# Patient Record
Sex: Male | Born: 1935
Health system: Southern US, Community
[De-identification: ages and names within clinical notes are randomized; demographics above are authoritative.]

## PROBLEM LIST (undated history)

## (undated) DIAGNOSIS — IMO0002 Reserved for concepts with insufficient information to code with codable children: Secondary | ICD-10-CM

## (undated) DIAGNOSIS — N400 Enlarged prostate without lower urinary tract symptoms: Secondary | ICD-10-CM

## (undated) DIAGNOSIS — J189 Pneumonia, unspecified organism: Secondary | ICD-10-CM

## (undated) DIAGNOSIS — E782 Mixed hyperlipidemia: Secondary | ICD-10-CM

## (undated) DIAGNOSIS — E119 Type 2 diabetes mellitus without complications: Secondary | ICD-10-CM

## (undated) DIAGNOSIS — I493 Ventricular premature depolarization: Secondary | ICD-10-CM

## (undated) DIAGNOSIS — Z860100 Personal history of colon polyps, unspecified: Secondary | ICD-10-CM

## (undated) DIAGNOSIS — I701 Atherosclerosis of renal artery: Secondary | ICD-10-CM

## (undated) DIAGNOSIS — J449 Chronic obstructive pulmonary disease, unspecified: Secondary | ICD-10-CM

## (undated) DIAGNOSIS — Z8619 Personal history of other infectious and parasitic diseases: Secondary | ICD-10-CM

## (undated) DIAGNOSIS — M1990 Unspecified osteoarthritis, unspecified site: Secondary | ICD-10-CM

## (undated) DIAGNOSIS — C3490 Malignant neoplasm of unspecified part of unspecified bronchus or lung: Secondary | ICD-10-CM

## (undated) DIAGNOSIS — Z8709 Personal history of other diseases of the respiratory system: Secondary | ICD-10-CM

## (undated) DIAGNOSIS — C4490 Unspecified malignant neoplasm of skin, unspecified: Secondary | ICD-10-CM

## (undated) DIAGNOSIS — I10 Essential (primary) hypertension: Secondary | ICD-10-CM

## (undated) DIAGNOSIS — N289 Disorder of kidney and ureter, unspecified: Secondary | ICD-10-CM

## (undated) DIAGNOSIS — I491 Atrial premature depolarization: Secondary | ICD-10-CM

## (undated) DIAGNOSIS — I6529 Occlusion and stenosis of unspecified carotid artery: Secondary | ICD-10-CM

## (undated) DIAGNOSIS — IMO0001 Reserved for inherently not codable concepts without codable children: Secondary | ICD-10-CM

## (undated) HISTORY — PX: COLONOSCOPY: SHX174

## (undated) HISTORY — DX: Unspecified malignant neoplasm of skin, unspecified: C44.90

## (undated) HISTORY — DX: Unspecified osteoarthritis, unspecified site: M19.90

## (undated) HISTORY — DX: Mixed hyperlipidemia: E78.2

## (undated) HISTORY — DX: Disorder of kidney and ureter, unspecified: N28.9

## (undated) HISTORY — DX: Ventricular premature depolarization: I49.3

## (undated) HISTORY — DX: Atherosclerosis of renal artery: I70.1

## (undated) HISTORY — DX: Atrial premature depolarization: I49.1

## (undated) HISTORY — DX: Occlusion and stenosis of unspecified carotid artery: I65.29

## (undated) HISTORY — DX: Reserved for inherently not codable concepts without codable children: IMO0001

## (undated) HISTORY — DX: Reserved for concepts with insufficient information to code with codable children: IMO0002

## (undated) HISTORY — PX: NO PAST SURGERIES: SHX2092

## (undated) HISTORY — DX: Type 2 diabetes mellitus without complications: E11.9

## (undated) HISTORY — DX: Essential (primary) hypertension: I10

---

## 2000-07-10 ENCOUNTER — Emergency Department (HOSPITAL_COMMUNITY): Admission: EM | Admit: 2000-07-10 | Discharge: 2000-07-10 | Payer: Self-pay | Admitting: Emergency Medicine

## 2000-10-24 ENCOUNTER — Ambulatory Visit (HOSPITAL_COMMUNITY): Admission: RE | Admit: 2000-10-24 | Discharge: 2000-10-24 | Payer: Self-pay | Admitting: Gastroenterology

## 2000-10-24 ENCOUNTER — Encounter (INDEPENDENT_AMBULATORY_CARE_PROVIDER_SITE_OTHER): Payer: Self-pay | Admitting: *Deleted

## 2005-04-11 ENCOUNTER — Encounter: Admission: RE | Admit: 2005-04-11 | Discharge: 2005-04-11 | Payer: Self-pay | Admitting: Cardiology

## 2006-03-11 ENCOUNTER — Ambulatory Visit: Payer: Self-pay | Admitting: Vascular Surgery

## 2006-04-15 ENCOUNTER — Ambulatory Visit: Payer: Self-pay | Admitting: Vascular Surgery

## 2006-09-23 ENCOUNTER — Encounter: Admission: RE | Admit: 2006-09-23 | Discharge: 2006-09-23 | Payer: Self-pay | Admitting: Emergency Medicine

## 2006-10-18 ENCOUNTER — Ambulatory Visit: Payer: Self-pay | Admitting: Vascular Surgery

## 2007-05-23 ENCOUNTER — Ambulatory Visit: Payer: Self-pay | Admitting: Vascular Surgery

## 2008-05-28 ENCOUNTER — Ambulatory Visit: Payer: Self-pay | Admitting: Vascular Surgery

## 2009-05-25 ENCOUNTER — Ambulatory Visit: Payer: Self-pay | Admitting: Vascular Surgery

## 2010-01-17 ENCOUNTER — Ambulatory Visit: Admit: 2010-01-17 | Payer: Self-pay | Admitting: Vascular Surgery

## 2010-01-17 ENCOUNTER — Ambulatory Visit
Admission: RE | Admit: 2010-01-17 | Discharge: 2010-01-17 | Payer: Self-pay | Source: Home / Self Care | Attending: Vascular Surgery | Admitting: Vascular Surgery

## 2010-05-30 NOTE — Procedures (Signed)
CAROTID DUPLEX EXAM   INDICATION:  Follow up carotid artery disease.   HISTORY:  Diabetes:  No.  Cardiac:  No.  Hypertension:  Yes.  Smoking:  No.  Previous Surgery:  No.  CV History:  Asymptomatic.  Amaurosis Fugax No, Paresthesias No, Hemiparesis No.                                       RIGHT             LEFT  Brachial systolic pressure:         140               138  Brachial Doppler waveforms:         WNL               WNL  Vertebral direction of flow:        Antegrade         Antegrade  DUPLEX VELOCITIES (cm/sec)  CCA peak systolic                   M = 113, D = 134  93  ECA peak systolic                   264               431  ICA peak systolic                   329               257  ICA end diastolic                   36                45  PLAQUE MORPHOLOGY:                  Calcific          Calcific  PLAQUE AMOUNT:                      Moderate          Moderate  PLAQUE LOCATION:                    ICA, ECA          CCA, ICA, ECA   IMPRESSION:  1. Bilateral internal carotid artery suggests 60% to 79% stenosis.  2. Antegrade flow in bilateral vertebrals.  3. Stenosis noted in bilateral external carotid arteries.   ___________________________________________  Larina Earthly, M.D.   CB/MEDQ  D:  05/25/2009  T:  05/25/2009  Job:  [id number]

## 2010-05-30 NOTE — Assessment & Plan Note (Signed)
OFFICE VISIT   Souter, Manual C  DOB:  September 24, 1927                                       10/18/2006  [id number]#:[id number]   The patient presents today for evaluation of his asymptomatic carotid  disease.  He has had no neurologic deficits, specifically amaurosis  fugax, transient ischemic attack or stroke since my initial visit with  him in February of 2008.  He has known moderate to severe right and  moderate left carotid stenosis.  He reports that had an MRA suggesting  moderate renal artery stenosis and is undergoing further consideration  of this as well.  We had a discussion regarding treatment of  asymptomatic renal artery stenosis and I explained that we are  relatively conservative with this unless there is diminished renal  function or uncontrolled hypertension.  His physical exam is unchanged.  His carotid arteries are without bruits bilaterally.  He has 2+ radial  pulses.  He is grossly intact neurologically.  Blood pressure today is  144/75, pulse 59, respirations 18.  His carotid duplex shows no change  with 60-79% right carotid stenosis and 40-59% left carotid stenosis on  today's exam.  I again reviewed symptoms of carotid disease with him; he  will notify us should this occur.  Otherwise we will see him in 1 year  with repeat carotid duplex evaluation.   Larina Earthly, M.D.  Electronically Signed   TFE/MEDQ  D:  10/18/2006  T:  10/21/2006  Job:  [id number]   cc:   Brett Canales A. Cleta Alberts, M.D.

## 2010-05-30 NOTE — Procedures (Signed)
CAROTID DUPLEX EXAM   INDICATION:  Followup carotid artery disease.   HISTORY:  Diabetes:  No.  Cardiac:  No.  Hypertension:  Yes.  Smoking:  No.  Previous Surgery:  No.  CV History:  The patient is currently asymptomatic.  Amaurosis Fugax No, Paresthesias No, Hemiparesis No                                       RIGHT             LEFT  Brachial systolic pressure:         142               136  Brachial Doppler waveforms:         WNL               WNL  Vertebral direction of flow:        Antegrade         Antegrade  DUPLEX VELOCITIES (cm/sec)  CCA peak systolic                   85                85  ECA peak systolic                   185               237  ICA peak systolic                   121               197  ICA end diastolic                   20                22  PLAQUE MORPHOLOGY:                  Heterogeneous     Heterogeneous  PLAQUE AMOUNT:                      Moderate          Moderate  PLAQUE LOCATION:                    CCA, ICA and ECA  CCA, ICA and ECA   IMPRESSION:  Bilateral 40% to 59% stenosis within internal carotid  arteries.  Bilateral intimal thickening within the common carotid  arteries.  Severely dampened waveform within the right vertebral.  Prominent left vertebral.  Bilateral external carotid artery stenosis.   ___________________________________________  Larina Earthly, M.D.   OD/MEDQ  D:  01/17/2010  T:  01/17/2010  Job:  [id number]

## 2010-05-30 NOTE — Assessment & Plan Note (Signed)
OFFICE VISIT   Benjamin Patel, Benjamin Patel  DOB:  09/14/1927                                       01/17/2010  ONGEX#:[id number]   The patient presents today for evaluation and continued followup of  asymptomatic extracranial cerebrovascular occlusive disease.  He is a  very pleasant healthy 77 year old gentleman who is long-term followup of  asymptomatic carotid stenosis.  He has never had any amaurosis fugax,  transient ischemic attack or stroke.  He has no history of cardiac  disease.  Does have a history of controlled hypertension.  He is not a  diabetic.  He does not smoke cigarettes.  He does have a known history  of moderate renal artery stenosis and is to follow up with this at his  cardiologist's office later this month.  I again explained that we would  not recommend intervention unless he had uncontrolled hypertension or  significant suggestion of renal compromise.   PHYSICAL EXAM:  General:  Well-developed, well-nourished white male  appearing stated age, in no acute distress.  Vital signs:  Blood  pressure is 138/72, pulse 64, respirations 18.  HEENT:  Normal.  Chest:  Clear bilaterally without rales, rhonchi or wheezes.  Cardiovascular:  Heart regular rate and rhythm.  I do not appreciate carotid bruits.  He  has 2+ radial pulses and 2+ dorsalis pedis pulses bilaterally.  Musculoskeletal:  Shows no major deformity or cyanosis.  Neurological:  No focal weakness or paresthesias.   He underwent carotid duplex in our office which I reviewed and  independently interpreted with the patient.  This shows moderate  bilateral 40% to 59% stenoses.  His velocities are somewhat lower than  they had been at our last study.  I explained that we would recommend  continued yearly observation and I again reviewed symptoms of carotid  disease with him and he will notify me should this occur.  Otherwise we  will see him again in 1 year for repeat carotid duplex.     Larina Earthly, M.D.  Electronically Signed   TFE/MEDQ  D:  01/17/2010  T:  01/17/2010  Job:  [id number]   cc:   Brett Canales A. Cleta Alberts, M.D.

## 2010-05-30 NOTE — Procedures (Signed)
CAROTID DUPLEX EXAM   INDICATION:  Follow up known carotid artery disease.   HISTORY:  Diabetes:  No.  Cardiac:  No.  Hypertension:  Yes.  Smoking:  No.  Previous Surgery:  No.  CV History:  Amaurosis Fugax No, Paresthesias No, Hemiparesis No                                       RIGHT             LEFT  Brachial systolic pressure:         130               133  Brachial Doppler waveforms:         Biphasic          Biphasic  Vertebral direction of flow:        Antegrade         Antegrade  DUPLEX VELOCITIES (cm/sec)  CCA peak systolic                   63                91  ECA peak systolic                   268               195  ICA peak systolic                   301               147  ICA end diastolic                   46                26  PLAQUE MORPHOLOGY:                  Heterogenous      Heterogenous  PLAQUE AMOUNT:                      Moderate          Mild  PLAQUE LOCATION:                    Distal CCA, ICA, and ECA            Distal CCA, ICA, and ECA   IMPRESSION:  1. A 60-79% stenosis noted in the left internal carotid artery.  2. A 40-59% stenosis noted in the right internal carotid artery.  3. Antegrade bilateral vertebral arteries.   ___________________________________________  Larina Earthly, M.D.   MG/MEDQ  D:  10/18/2006  T:  10/19/2006  Job:  [id number]

## 2010-05-30 NOTE — Procedures (Signed)
CAROTID DUPLEX EXAM   INDICATION:  Carotid artery disease.   HISTORY:  Diabetes:  No.  Cardiac:  No.  Hypertension:  Yes.  Smoking:  No.  Previous Surgery:  No.  CV History:  Asymptomatic.  Amaurosis Fugax No, Paresthesias No, Hemiparesis No.                                       RIGHT              LEFT  Brachial systolic pressure:         162                164  Brachial Doppler waveforms:         Normal             Normal  Vertebral direction of flow:        Antegrade/dampened Antegrade  DUPLEX VELOCITIES (cm/sec)  CCA peak systolic                   159 (D)            94  ECA peak systolic                   216                352  ICA peak systolic                   229                188  ICA end diastolic                   32                 32  PLAQUE MORPHOLOGY:                  Calcific           Calcific  PLAQUE AMOUNT:                      Moderate/severe    Moderate  PLAQUE LOCATION:                    ICA/ECA/CCA        ICA/ECA/CCA   IMPRESSION:  1. Low-end 60% to 79% stenosis of the right internal carotid artery.  2. A 40% to 59% stenosis of the left internal carotid artery.  3. Severely dampened antegrade flow is noted within the right      vertebral artery.  4. No significant change noted when compared to the previous exam on      05/23/2007.   ___________________________________________  Larina Earthly, M.D.   CH/MEDQ  D:  05/28/2008  T:  05/28/2008  Job:  [id number]

## 2010-05-30 NOTE — Procedures (Signed)
CAROTID DUPLEX EXAM   INDICATION:  Followup evaluation of known carotid artery disease.   HISTORY:  Diabetes:  No.  Cardiac:  No.  Hypertension:  Yes.  Smoking:  No.  Previous Surgery:  No.  CV History:  The patient reports no cerebrovascular symptoms at this  time.  Amaurosis Fugax No, Paresthesias No, Hemiparesis No                                       RIGHT             LEFT  Brachial systolic pressure:         168               166  Brachial Doppler waveforms:         Triphasic         Triphasic  Vertebral direction of flow:        Inaudible         Antegrade  DUPLEX VELOCITIES (cm/sec)  CCA peak systolic                   92                82  ECA peak systolic                   241               169  ICA peak systolic                   214               159  ICA end diastolic                   22                23  PLAQUE MORPHOLOGY:                  Calcified         Calcified  PLAQUE AMOUNT:                      Moderate          Mild  PLAQUE LOCATION:                    Distal CCA, proximal ICA            Proximal ICA   IMPRESSION:  1. 60-79% right ICA stenosis (low end of range).  2. 40-59% left ICA stenosis.  3. Cannot rule out right vertebral artery occlusion versus trickle      flow.  4. No significant change from previous study performed 10/18/2006.   ___________________________________________  Larina Earthly, M.D.   MC/MEDQ  D:  05/23/2007  T:  05/23/2007  Job:  [id number]

## 2010-06-02 NOTE — Procedures (Signed)
[facility]  Patient:    Benjamin Patel, Benjamin Patel Visit Number: [id number] MRN: [id number]          Service Type: END Location: ENDO Attending Physician:  Nelda Marseille Proc. Date: 10/24/00 Admit Date:  10/24/2000   CC:         Viviann Spare A. Cleta Alberts, M.D.   Procedure Report  PROCEDURE:  Colonoscopy with polypectomy.  INDICATIONS:  Screening.  INFORMED CONSENT:  Consent was signed after risk, benefits, methods and options thoroughly discussed in the office.  MEDICINES USED:  Demerol 60 mg, Versed 6 mg.  DESCRIPTION OF PROCEDURE:  Rectal inspection was pertinent for small external hemorrhoids.  Digital exam was negative.  The pediatric video colonoscope was inserted and fairly easily advanced around the colon to the cecum.  On insertion in the proximal sigmoid, small and medium sized polyps were seen. Based on their size, we elected to remove them on withdrawal.  To advance to the cecum required rolling him on his back and some abdominal pressure.  The cecum was identified by the appendiceal orifice and the ileocecal valve.  The scope was slowly withdrawn.  There was left greater than right diverticula throughout.  The cecum and the ascending was normal.  In the hepatic flexure a small 3 mm polyp was seen and was hot biopsied x 2 and put in the first container.  No transverse or descending abnormalities were seen other than diverticula.  As we withdrew back to the polyps seen on insertion, the larger of the two was pedunculated and snared, electrocautery applied.  The polyp was removed, suctioned onto the head of the scope, and the scope was removed, the polyp recovered.  The scope was reinserted to the level of the polypectomy site, had a nice white coagulum without any residual polyp.  We then went ahead and snared the smaller polyp which was removed, suctioned through the scope, and collected in the trap.  There seemed to be some residual tissue. This  was a semi-sessile small polyp, and we went ahead and hot biopsied it x 2. There was a little bit of oozing from the hot biopsy site.  We increased the cautery to 25-25 and went ahead and used the hot biopsy forceps to cauterize which was done without obvious problem and nice cessation of bleed. The smaller polyp was put in the third container.  The scope was slowly withdrawn. In the distal sigmoid, the 2 mm polyp was seen and was hot biopsied x 1 and put in a fourth container.  No other abnormalities but the left-sided diverticula were seen.  Once back in the rectum, the scope was retroflexed, pertinent for some internal hemorrhoids.  The scope was straightened and readvanced to the polypectomy sites, without signs of bleeding or residual polyp.  Air was suctioned and the scope removed.  The patient tolerated the procedure well, and there was no obvious immediate complication.  ENDOSCOPIC DIAGNOSES: 1. Internal and external hemorrhoids. 2. Left greater than right diverticula. 3. Multiple small polyps in the distal sigmoid, hot biopsied; proximal    sigmoid, snared and hot biopsied; and hepatic flexure, hot biopsied x 2. 4. Medium sized polyp in the proximal sigmoid, status post snared. 5. Otherwise within normal limits to the cecum.  PLAN:  Await pathology to determine future colonic screening.  Otherwise, two week customary postpolypectomy instructions.  Return care to Dr. Cleta Alberts for customary health care maintenance and screening, including yearly rectals and guaiacs but happy to see back sooner  p.r.n. Attending Physician:  Nelda Marseille DD:  10/24/00 TD:  10/25/00 Job: [id number]

## 2010-10-06 ENCOUNTER — Other Ambulatory Visit: Payer: Self-pay | Admitting: Emergency Medicine

## 2010-10-10 ENCOUNTER — Ambulatory Visit
Admission: RE | Admit: 2010-10-10 | Discharge: 2010-10-10 | Disposition: A | Discharge status: Home / Self Care | Payer: Medicare Other | Source: Ambulatory Visit | Attending: Emergency Medicine | Admitting: Emergency Medicine

## 2010-11-14 ENCOUNTER — Encounter: Payer: Self-pay | Admitting: Emergency Medicine

## 2010-11-14 DIAGNOSIS — E1151 Type 2 diabetes mellitus with diabetic peripheral angiopathy without gangrene: Secondary | ICD-10-CM | POA: Insufficient documentation

## 2010-11-14 DIAGNOSIS — N289 Disorder of kidney and ureter, unspecified: Secondary | ICD-10-CM | POA: Insufficient documentation

## 2010-11-14 DIAGNOSIS — K8020 Calculus of gallbladder without cholecystitis without obstruction: Secondary | ICD-10-CM | POA: Insufficient documentation

## 2010-11-14 DIAGNOSIS — I701 Atherosclerosis of renal artery: Secondary | ICD-10-CM | POA: Insufficient documentation

## 2010-11-14 DIAGNOSIS — I10 Essential (primary) hypertension: Secondary | ICD-10-CM | POA: Insufficient documentation

## 2010-11-14 DIAGNOSIS — M109 Gout, unspecified: Secondary | ICD-10-CM | POA: Insufficient documentation

## 2010-11-14 DIAGNOSIS — I6529 Occlusion and stenosis of unspecified carotid artery: Secondary | ICD-10-CM | POA: Insufficient documentation

## 2010-11-14 DIAGNOSIS — E782 Mixed hyperlipidemia: Secondary | ICD-10-CM | POA: Insufficient documentation

## 2011-01-02 ENCOUNTER — Ambulatory Visit (INDEPENDENT_AMBULATORY_CARE_PROVIDER_SITE_OTHER): Payer: Medicare Other | Admitting: Emergency Medicine

## 2011-01-02 DIAGNOSIS — I10 Essential (primary) hypertension: Secondary | ICD-10-CM

## 2011-01-02 DIAGNOSIS — R972 Elevated prostate specific antigen [PSA]: Secondary | ICD-10-CM

## 2011-01-02 DIAGNOSIS — I2510 Atherosclerotic heart disease of native coronary artery without angina pectoris: Secondary | ICD-10-CM

## 2011-01-30 ENCOUNTER — Ambulatory Visit (INDEPENDENT_AMBULATORY_CARE_PROVIDER_SITE_OTHER): Payer: Medicare Other | Admitting: Emergency Medicine

## 2011-01-30 DIAGNOSIS — E119 Type 2 diabetes mellitus without complications: Secondary | ICD-10-CM | POA: Diagnosis not present

## 2011-01-30 DIAGNOSIS — I10 Essential (primary) hypertension: Secondary | ICD-10-CM | POA: Diagnosis not present

## 2011-01-30 DIAGNOSIS — I701 Atherosclerosis of renal artery: Secondary | ICD-10-CM

## 2011-02-13 ENCOUNTER — Ambulatory Visit (INDEPENDENT_AMBULATORY_CARE_PROVIDER_SITE_OTHER): Payer: Medicare Other | Admitting: Emergency Medicine

## 2011-02-13 ENCOUNTER — Other Ambulatory Visit: Payer: Self-pay | Admitting: Emergency Medicine

## 2011-02-13 VITALS — BP 154/70 | HR 67 | Temp 97.4°F | Resp 20 | Ht 67.0 in | Wt 170.4 lb

## 2011-02-13 DIAGNOSIS — E119 Type 2 diabetes mellitus without complications: Secondary | ICD-10-CM | POA: Diagnosis not present

## 2011-02-13 DIAGNOSIS — I10 Essential (primary) hypertension: Secondary | ICD-10-CM | POA: Diagnosis not present

## 2011-02-13 DIAGNOSIS — I2510 Atherosclerotic heart disease of native coronary artery without angina pectoris: Secondary | ICD-10-CM

## 2011-02-13 MED ORDER — HYDROCHLOROTHIAZIDE 25 MG PO TABS: 25.0000 mg | ORAL_TABLET | Freq: Every day | ORAL | Status: DC

## 2011-02-13 MED ORDER — METOPROLOL SUCCINATE ER 100 MG PO TB24: 100.0000 mg | ORAL_TABLET | Freq: Every day | ORAL | Status: DC

## 2011-02-13 MED ORDER — LOSARTAN POTASSIUM 50 MG PO TABS: 50.0000 mg | ORAL_TABLET | Freq: Every day | ORAL | Status: DC

## 2011-02-13 NOTE — Progress Notes (Signed)
  Subjective:    Patient ID: Benjamin Patel, male    DOB: 1927/04/02, 78 y.o.   MRN: [id number]  Hypertension This is a chronic problem. The current episode started more than 1 year ago. The problem is unchanged. Pertinent negatives include no chest pain, headaches, orthopnea, palpitations or peripheral edema. Risk factors for coronary artery disease include diabetes mellitus, dyslipidemia and family history. Past treatments include diuretics, beta blockers and angiotensin blockers. The current treatment provides moderate improvement. There are no compliance problems.       Review of Systems  Cardiovascular: Negative for chest pain, palpitations and orthopnea.  Neurological: Negative for headaches.      Objective:   Physical Exambp 150/70       Assessment & Plan:  Increase metoprolol to 100mg  per day

## 2011-02-13 NOTE — Patient Instructions (Signed)
Continue to monitor blood pressure. Phone report in 2 weeks.

## 2011-02-27 ENCOUNTER — Telehealth: Payer: Self-pay | Admitting: Emergency Medicine

## 2011-02-27 NOTE — Telephone Encounter (Signed)
I received a call from [facility] regarding his blood pressure readings. His systolic pressure remains high at 150 diastolics have been good at 70. He has an appointment to see Dr. Arbie Cookey for a recheck March 1. He is to notify me after that appointment and we'll make a decision about possibly increasing his clonidine to 0.3 twice a day if his pressure remains elevated.

## 2011-03-21 ENCOUNTER — Encounter: Payer: Self-pay | Admitting: Vascular Surgery

## 2011-03-22 ENCOUNTER — Other Ambulatory Visit (INDEPENDENT_AMBULATORY_CARE_PROVIDER_SITE_OTHER): Payer: Medicare Other | Admitting: *Deleted

## 2011-03-22 DIAGNOSIS — I6529 Occlusion and stenosis of unspecified carotid artery: Secondary | ICD-10-CM

## 2011-03-27 ENCOUNTER — Ambulatory Visit: Payer: Medicare Other | Admitting: Emergency Medicine

## 2011-03-30 ENCOUNTER — Other Ambulatory Visit: Payer: Self-pay | Admitting: *Deleted

## 2011-03-30 DIAGNOSIS — I6529 Occlusion and stenosis of unspecified carotid artery: Secondary | ICD-10-CM

## 2011-03-30 NOTE — Procedures (Unsigned)
CAROTID DUPLEX EXAM  INDICATION:  Follow up carotid artery disease.  HISTORY: Diabetes:  No Cardiac:  No Hypertension:  Yes Smoking:  No Previous Surgery:  No CV History:  Asymptomatic Amaurosis Fugax No, Paresthesias No, Hemiparesis No                                      RIGHT             LEFT Brachial systolic pressure:         143               140 Brachial Doppler waveforms:         Normal            Normal Vertebral direction of flow:        Antegrade         Antegrade DUPLEX VELOCITIES (cm/sec) CCA peak systolic                   M=87  D=223       91 ECA peak systolic                   251               235 ICA peak systolic                   221               211 ICA end diastolic                   29                32 PLAQUE MORPHOLOGY:                  Mixed             Mixed PLAQUE AMOUNT:                      Moderate          Moderate PLAQUE LOCATION:                    CCA, ICA, ECA, bifurcation          CCA, ICA, ECA, bifurcation  IMPRESSION: 1. Bilateral internal carotid artery velocities suggest 1% to 39%     stenosis based on velocity criteria. 2. Right distal common carotid artery/bifurcation stenosis. 3. Distal common carotid artery/internal carotid artery stenosis of     the left internal carotid artery/common carotid artery. 4. Bilateral external carotid artery stenosis.  ___________________________________________ Larina Earthly, M.D.  EM/MEDQ  D:  03/22/2011  T:  03/22/2011  Job:  [id number]

## 2011-04-02 ENCOUNTER — Encounter: Payer: Self-pay | Admitting: Vascular Surgery

## 2011-04-05 ENCOUNTER — Other Ambulatory Visit: Payer: Self-pay | Admitting: Family Medicine

## 2011-04-06 ENCOUNTER — Ambulatory Visit (INDEPENDENT_AMBULATORY_CARE_PROVIDER_SITE_OTHER): Payer: Medicare Other | Admitting: Emergency Medicine

## 2011-04-06 DIAGNOSIS — I10 Essential (primary) hypertension: Secondary | ICD-10-CM | POA: Diagnosis not present

## 2011-04-06 DIAGNOSIS — E119 Type 2 diabetes mellitus without complications: Secondary | ICD-10-CM

## 2011-04-06 DIAGNOSIS — E782 Mixed hyperlipidemia: Secondary | ICD-10-CM

## 2011-04-06 LAB — GLUCOSE, POCT (MANUAL RESULT ENTRY): POC Glucose: 116

## 2011-04-06 LAB — POCT GLYCOSYLATED HEMOGLOBIN (HGB A1C): Hemoglobin A1C: 5.8

## 2011-04-06 NOTE — Progress Notes (Signed)
  Subjective:    Patient ID: Benjamin Patel, male    DOB: 10/09/1927, 78 y.o.   MRN: [id number]  HPI patient enters a followup on his diabetes high cholesterol high blood pressure. He is doing very well. Has been in the hospital for coronary disease and he has been over there a lot. He is somewhat tired but has not any chest pain shortness breath or any symptoms related to his diabetes.    Review of Systems noncontributory except as relates to this illness.     Objective:   Physical Exam  HENT:  Head: Normocephalic.  Eyes: Pupils are equal, round, and reactive to light.  Neck: No JVD present. No tracheal deviation present. No thyromegaly present.  Cardiovascular: Normal rate, regular rhythm and normal heart sounds.  Exam reveals no gallop and no friction rub.   No murmur heard. Pulmonary/Chest: No respiratory distress. He has no wheezes. He has no rales. He exhibits no tenderness.  Abdominal: He exhibits no distension and no mass. There is no tenderness. There is no rebound and no guarding.  Lymphadenopathy:    He has no cervical adenopathy.          Assessment & Plan:  We'll check [place] today as well as hemoglobin A1c and glucose. His blood pressure is under excellent: Controll.Marland Kitchen

## 2011-04-07 LAB — BASIC METABOLIC PANEL
BUN: 30 mg/dL — ABNORMAL HIGH (ref 6–23)
CO2: 26 mEq/L (ref 19–32)
Chloride: 103 mEq/L (ref 96–112)
Creat: 1.25 mg/dL (ref 0.50–1.35)
Glucose, Bld: 118 mg/dL — ABNORMAL HIGH (ref 70–99)
Potassium: 4.6 mEq/L (ref 3.5–5.3)
Sodium: 139 mEq/L (ref 135–145)

## 2011-04-23 ENCOUNTER — Telehealth: Payer: Self-pay

## 2011-04-23 ENCOUNTER — Other Ambulatory Visit: Payer: Self-pay | Admitting: Emergency Medicine

## 2011-04-23 DIAGNOSIS — L989 Disorder of the skin and subcutaneous tissue, unspecified: Secondary | ICD-10-CM

## 2011-04-23 NOTE — Telephone Encounter (Signed)
Would like a referral to dermatologist. He saw dr Cleta Alberts last week and they talked about the skin issue, but no referral was made.

## 2011-04-23 NOTE — Telephone Encounter (Signed)
Spoke with pt advised derm referral in process. Pt understood

## 2011-04-23 NOTE — Telephone Encounter (Signed)
Call patient led and let him know I have made the referral to the dermatologist they will call him with the appointment

## 2011-04-23 NOTE — Telephone Encounter (Signed)
Dr Cleta Alberts, do you want to refer pt to dermatologist?

## 2011-05-03 ENCOUNTER — Other Ambulatory Visit: Payer: Self-pay | Admitting: Physician Assistant

## 2011-05-03 DIAGNOSIS — L570 Actinic keratosis: Secondary | ICD-10-CM | POA: Diagnosis not present

## 2011-06-26 ENCOUNTER — Other Ambulatory Visit: Payer: Self-pay | Admitting: Physician Assistant

## 2011-07-17 ENCOUNTER — Ambulatory Visit (INDEPENDENT_AMBULATORY_CARE_PROVIDER_SITE_OTHER): Payer: Medicare Other | Admitting: Emergency Medicine

## 2011-07-17 VITALS — BP 99/63 | HR 64 | Temp 97.4°F | Resp 16 | Ht 68.0 in | Wt 172.0 lb

## 2011-07-17 DIAGNOSIS — G619 Inflammatory polyneuropathy, unspecified: Secondary | ICD-10-CM | POA: Diagnosis not present

## 2011-07-17 DIAGNOSIS — G629 Polyneuropathy, unspecified: Secondary | ICD-10-CM

## 2011-07-17 DIAGNOSIS — E119 Type 2 diabetes mellitus without complications: Secondary | ICD-10-CM | POA: Diagnosis not present

## 2011-07-17 DIAGNOSIS — G622 Polyneuropathy due to other toxic agents: Secondary | ICD-10-CM | POA: Diagnosis not present

## 2011-07-17 MED ORDER — GABAPENTIN 100 MG PO CAPS: ORAL_CAPSULE | ORAL | Status: DC

## 2011-07-17 NOTE — Progress Notes (Signed)
  Subjective:    Patient ID: Benjamin Patel, male    DOB: Jun 07, 1927, 78 y.o.   MRN: [id number]  HPI patient in for followup. He overall feels well. He is not having a chest pain shortness of breath or any new complaints at the present time. His sugars have been under much better control. Blood pressure has been under much better control. Has had some tingling especially in the bottle of his left foot    Review of Systems     Objective:   Physical Exam  Constitutional: He appears well-developed.  HENT:  Head: Normocephalic.  Eyes: Pupils are equal, round, and reactive to light.  Neck: No thyromegaly present.  Cardiovascular: Normal rate and regular rhythm.  Exam reveals no gallop.   Pulmonary/Chest: Effort normal and breath sounds normal. No respiratory distress. He has no wheezes. He has no rales.  Abdominal: Soft.  Musculoskeletal:       Examination of the extremities revealed decreased fine touch involving the bottom of the left foot   Results for orders placed in visit on 07/17/11  GLUCOSE, POCT (MANUAL RESULT ENTRY)      Component Value Range   POC Glucose 133 (*) 70 - 99 mg/dl  POCT GLYCOSYLATED HEMOGLOBIN (HGB A1C)      Component Value Range   Hemoglobin A1C 5.8           Assessment & Plan:  I suspect probably has some degree of neuropathy involving the left foot. He certainly has extensive vascular disease related to his diabetes and certainly would be high risk for neuropathy we'll add Neurontin 100 mg one to 2 at night. Glucose and hemoglobin A1c were done hemoglobin A1c is 5.8 in excellent control.

## 2011-09-03 DIAGNOSIS — L259 Unspecified contact dermatitis, unspecified cause: Secondary | ICD-10-CM | POA: Diagnosis not present

## 2011-09-12 DIAGNOSIS — R69 Illness, unspecified: Secondary | ICD-10-CM | POA: Diagnosis not present

## 2011-09-12 DIAGNOSIS — L570 Actinic keratosis: Secondary | ICD-10-CM | POA: Diagnosis not present

## 2011-09-18 ENCOUNTER — Other Ambulatory Visit: Payer: Self-pay | Admitting: Physician Assistant

## 2011-10-16 ENCOUNTER — Ambulatory Visit (INDEPENDENT_AMBULATORY_CARE_PROVIDER_SITE_OTHER): Payer: Medicare Other | Admitting: Emergency Medicine

## 2011-10-16 VITALS — BP 125/69 | HR 65 | Temp 97.4°F | Resp 16 | Wt 171.0 lb

## 2011-10-16 DIAGNOSIS — I10 Essential (primary) hypertension: Secondary | ICD-10-CM

## 2011-10-16 DIAGNOSIS — T7840XA Allergy, unspecified, initial encounter: Secondary | ICD-10-CM | POA: Diagnosis not present

## 2011-10-16 DIAGNOSIS — E782 Mixed hyperlipidemia: Secondary | ICD-10-CM | POA: Diagnosis not present

## 2011-10-16 DIAGNOSIS — E119 Type 2 diabetes mellitus without complications: Secondary | ICD-10-CM

## 2011-10-16 LAB — COMPREHENSIVE METABOLIC PANEL
Albumin: 4.6 g/dL (ref 3.5–5.2)
Alkaline Phosphatase: 40 U/L (ref 39–117)
BUN: 29 mg/dL — ABNORMAL HIGH (ref 6–23)
CO2: 23 mEq/L (ref 19–32)
Calcium: 10.4 mg/dL (ref 8.4–10.5)
Sodium: 137 mEq/L (ref 135–145)
Total Bilirubin: 0.8 mg/dL (ref 0.3–1.2)
Total Protein: 6.9 g/dL (ref 6.0–8.3)

## 2011-10-16 LAB — LIPID PANEL
Cholesterol: 158 mg/dL (ref 0–200)
LDL Cholesterol: 80 mg/dL (ref 0–99)
Total CHOL/HDL Ratio: 4.4 Ratio
VLDL: 42 mg/dL — ABNORMAL HIGH (ref 0–40)

## 2011-10-16 LAB — POCT CBG (FASTING - GLUCOSE)-MANUAL ENTRY: Glucose Fasting, POC: 113 mg/dL — AB (ref 70–99)

## 2011-10-16 MED ORDER — PREDNISONE 10 MG PO TABS: ORAL_TABLET | ORAL | Status: DC

## 2011-10-16 NOTE — Patient Instructions (Addendum)
Take Zyrtec 10 mg one a day. If you need to taken Allerest is okay but be careful about developing urinary retention secondary to this drug . No change in her diabetes medications her blood pressure medications we'll take a taper dose of prednisone and make referral to an allergist because he had a similar episode a few months ago with a rash underneath his left arm.

## 2011-10-16 NOTE — Progress Notes (Signed)
  Subjective:    Patient ID: Benjamin Patel, male    DOB: [birth year], 78 y.o.   MRN: [id number]  HPI problem #1 facial swelling. Patient ate at a restaurant Saturday afternoon for lunch where he had chicken tenders, sweet potatoes, and  Cabbage. That evening he noticed swelling around his eyes. He has had no respiratory distress no throat discomfort no difficulty breathing. He is to apply ice packs to the area and yesterday he took an allerest  Problem #2 diabetes. He continues on metformin and is watching his diet. Problem #3 hypertension he continues on all of his medications taking them regularly. He is known to have renal artery stenosis which is associated with the hypertension problem.  Problem #3 is hyperlipidemia, hypertriglyceridemia and he is taking his medications and watching his diet for these problems.  Review of Systems     Objective:   Physical Exam physical exam reveals a puffiness around both eyes. His neck is supple. His chest was clear to auscultation. His cardiac exam is unremarkable. Results for orders placed in visit on 10/16/11  POCT GLYCOSYLATED HEMOGLOBIN (HGB A1C)      Component Value Range   Hemoglobin A1C 6.1    POCT CBG (FASTING - GLUCOSE)-MANUAL ENTRY      Component Value Range   Glucose Fasting, POC 113 (*) 70 - 99 mg/dL         Assessment & Plan:  In going to hold off on his flu shot today since he seems to be having an acute allergic reaction to something. The most likely cause would be something he ate on Saturday at [employer] this he started to have symptoms that evening. He has had no other changes in medications

## 2011-10-22 ENCOUNTER — Telehealth: Payer: Self-pay

## 2011-10-22 DIAGNOSIS — R21 Rash and other nonspecific skin eruption: Secondary | ICD-10-CM | POA: Diagnosis not present

## 2011-10-22 NOTE — Telephone Encounter (Signed)
Office notes from 10/16/11 sent thru Epic.

## 2011-10-22 NOTE — Telephone Encounter (Signed)
TONYA FROM [facility] STATES DR DAUB REFERRED PT AND THEY DON'T HAVE RECORDS PLEASE FAX TO [phone] AND THE PHONE NUMBER IS [phone]

## 2011-10-25 ENCOUNTER — Ambulatory Visit (INDEPENDENT_AMBULATORY_CARE_PROVIDER_SITE_OTHER): Payer: Medicare Other

## 2011-10-25 DIAGNOSIS — Z23 Encounter for immunization: Secondary | ICD-10-CM | POA: Diagnosis not present

## 2011-11-01 DIAGNOSIS — D1801 Hemangioma of skin and subcutaneous tissue: Secondary | ICD-10-CM | POA: Diagnosis not present

## 2011-11-01 DIAGNOSIS — L570 Actinic keratosis: Secondary | ICD-10-CM | POA: Diagnosis not present

## 2011-11-15 DIAGNOSIS — R21 Rash and other nonspecific skin eruption: Secondary | ICD-10-CM | POA: Diagnosis not present

## 2011-11-15 DIAGNOSIS — H1045 Other chronic allergic conjunctivitis: Secondary | ICD-10-CM | POA: Diagnosis not present

## 2011-12-10 ENCOUNTER — Other Ambulatory Visit: Payer: Self-pay | Admitting: Physician Assistant

## 2011-12-24 DIAGNOSIS — E782 Mixed hyperlipidemia: Secondary | ICD-10-CM | POA: Diagnosis not present

## 2011-12-24 DIAGNOSIS — I150 Renovascular hypertension: Secondary | ICD-10-CM | POA: Diagnosis not present

## 2011-12-24 DIAGNOSIS — I701 Atherosclerosis of renal artery: Secondary | ICD-10-CM | POA: Diagnosis not present

## 2011-12-24 DIAGNOSIS — I10 Essential (primary) hypertension: Secondary | ICD-10-CM | POA: Diagnosis not present

## 2011-12-24 DIAGNOSIS — Z0389 Encounter for observation for other suspected diseases and conditions ruled out: Secondary | ICD-10-CM | POA: Diagnosis not present

## 2012-01-23 ENCOUNTER — Other Ambulatory Visit: Payer: Self-pay | Admitting: Emergency Medicine

## 2012-01-25 ENCOUNTER — Telehealth: Payer: Self-pay

## 2012-01-25 NOTE — Telephone Encounter (Signed)
Approved [employer] req for [place] and LMOM for pt that it has been done.

## 2012-01-25 NOTE — Telephone Encounter (Signed)
Patient's wife says she has called several times since Tuesday to get his gout medication refilled but I don't see a message, the only message I see is from the pharmacy. Nonetheless, she is very upset we haven't done this yet and his leg is very swollen.

## 2012-02-03 ENCOUNTER — Other Ambulatory Visit: Payer: Self-pay | Admitting: Emergency Medicine

## 2012-02-03 NOTE — Telephone Encounter (Signed)
Needs office visit and labs.

## 2012-02-04 ENCOUNTER — Other Ambulatory Visit: Payer: Self-pay | Admitting: Emergency Medicine

## 2012-02-08 ENCOUNTER — Ambulatory Visit (INDEPENDENT_AMBULATORY_CARE_PROVIDER_SITE_OTHER): Payer: Medicare Other | Admitting: Emergency Medicine

## 2012-02-08 ENCOUNTER — Observation Stay (HOSPITAL_COMMUNITY): Payer: Medicare Other

## 2012-02-08 ENCOUNTER — Encounter (HOSPITAL_COMMUNITY): Payer: Self-pay | Admitting: General Practice

## 2012-02-08 ENCOUNTER — Observation Stay (HOSPITAL_COMMUNITY)
Admission: AD | Admit: 2012-02-08 | Discharge: 2012-02-09 | Disposition: A | Discharge status: Home / Self Care | Payer: Medicare Other | Source: Ambulatory Visit | Attending: Family Medicine | Admitting: Family Medicine

## 2012-02-08 ENCOUNTER — Ambulatory Visit: Payer: Medicare Other

## 2012-02-08 VITALS — BP 163/63 | HR 98 | Temp 98.0°F | Resp 18 | Ht 68.25 in | Wt 168.4 lb

## 2012-02-08 DIAGNOSIS — E119 Type 2 diabetes mellitus without complications: Secondary | ICD-10-CM

## 2012-02-08 DIAGNOSIS — I6529 Occlusion and stenosis of unspecified carotid artery: Secondary | ICD-10-CM

## 2012-02-08 DIAGNOSIS — K8020 Calculus of gallbladder without cholecystitis without obstruction: Secondary | ICD-10-CM | POA: Insufficient documentation

## 2012-02-08 DIAGNOSIS — M109 Gout, unspecified: Secondary | ICD-10-CM | POA: Insufficient documentation

## 2012-02-08 DIAGNOSIS — Z79899 Other long term (current) drug therapy: Secondary | ICD-10-CM | POA: Insufficient documentation

## 2012-02-08 DIAGNOSIS — E785 Hyperlipidemia, unspecified: Secondary | ICD-10-CM | POA: Diagnosis not present

## 2012-02-08 DIAGNOSIS — I701 Atherosclerosis of renal artery: Secondary | ICD-10-CM

## 2012-02-08 DIAGNOSIS — Z7982 Long term (current) use of aspirin: Secondary | ICD-10-CM | POA: Diagnosis not present

## 2012-02-08 DIAGNOSIS — R911 Solitary pulmonary nodule: Secondary | ICD-10-CM | POA: Diagnosis not present

## 2012-02-08 DIAGNOSIS — J449 Chronic obstructive pulmonary disease, unspecified: Secondary | ICD-10-CM | POA: Diagnosis not present

## 2012-02-08 DIAGNOSIS — J4489 Other specified chronic obstructive pulmonary disease: Secondary | ICD-10-CM | POA: Insufficient documentation

## 2012-02-08 DIAGNOSIS — I10 Essential (primary) hypertension: Secondary | ICD-10-CM

## 2012-02-08 DIAGNOSIS — J069 Acute upper respiratory infection, unspecified: Principal | ICD-10-CM | POA: Insufficient documentation

## 2012-02-08 DIAGNOSIS — R21 Rash and other nonspecific skin eruption: Secondary | ICD-10-CM | POA: Diagnosis not present

## 2012-02-08 DIAGNOSIS — R339 Retention of urine, unspecified: Secondary | ICD-10-CM | POA: Diagnosis not present

## 2012-02-08 DIAGNOSIS — R05 Cough: Secondary | ICD-10-CM

## 2012-02-08 DIAGNOSIS — R059 Cough, unspecified: Secondary | ICD-10-CM | POA: Diagnosis not present

## 2012-02-08 DIAGNOSIS — N289 Disorder of kidney and ureter, unspecified: Secondary | ICD-10-CM

## 2012-02-08 DIAGNOSIS — E782 Mixed hyperlipidemia: Secondary | ICD-10-CM

## 2012-02-08 DIAGNOSIS — Z7902 Long term (current) use of antithrombotics/antiplatelets: Secondary | ICD-10-CM | POA: Diagnosis not present

## 2012-02-08 DIAGNOSIS — J438 Other emphysema: Secondary | ICD-10-CM | POA: Diagnosis not present

## 2012-02-08 DIAGNOSIS — J189 Pneumonia, unspecified organism: Secondary | ICD-10-CM

## 2012-02-08 HISTORY — DX: Pneumonia, unspecified organism: J18.9

## 2012-02-08 LAB — POCT CBC
MCH, POC: 29.9 pg (ref 27–31.2)
MCHC: 32.3 g/dL (ref 31.8–35.4)
POC Granulocyte: 17.9 — AB (ref 2–6.9)
POC MID %: 4.2 %M (ref 0–12)
Platelet Count, POC: 290 10*3/uL (ref 142–424)
RDW, POC: 12.8 %
WBC: 20.3 10*3/uL — AB (ref 4.6–10.2)

## 2012-02-08 LAB — GLUCOSE, CAPILLARY: Glucose-Capillary: 164 mg/dL — ABNORMAL HIGH (ref 70–99)

## 2012-02-08 LAB — COMPREHENSIVE METABOLIC PANEL
ALT: 17 U/L (ref 0–53)
Albumin: 4.6 g/dL (ref 3.5–5.2)
CO2: 27 mEq/L (ref 19–32)
Chloride: 97 mEq/L (ref 96–112)
Glucose, Bld: 201 mg/dL — ABNORMAL HIGH (ref 70–99)
Potassium: 4.7 mEq/L (ref 3.5–5.3)
Sodium: 137 mEq/L (ref 135–145)
Total Protein: 7.3 g/dL (ref 6.0–8.3)

## 2012-02-08 LAB — CBC
Hemoglobin: 13.3 g/dL (ref 13.0–17.0)
MCH: 30.4 pg (ref 26.0–34.0)
RBC: 4.37 MIL/uL (ref 4.22–5.81)

## 2012-02-08 LAB — POCT SKIN KOH: Skin KOH, POC: NEGATIVE

## 2012-02-08 LAB — GLUCOSE, POCT (MANUAL RESULT ENTRY): POC Glucose: 204 mg/dl — AB (ref 70–99)

## 2012-02-08 LAB — CREATININE, SERUM: Creatinine, Ser: 1.14 mg/dL (ref 0.50–1.35)

## 2012-02-08 LAB — POCT INFLUENZA A/B: Influenza A, POC: NEGATIVE

## 2012-02-08 MED ORDER — ALBUTEROL SULFATE (5 MG/ML) 0.5% IN NEBU: 2.5000 mg | INHALATION_SOLUTION | Freq: Four times a day (QID) | RESPIRATORY_TRACT | Status: DC | PRN

## 2012-02-08 MED ORDER — COLCHICINE 0.6 MG PO TABS
0.6000 mg | ORAL_TABLET | Freq: Two times a day (BID) | ORAL | Status: DC
  Filled 2012-02-08: qty 1

## 2012-02-08 MED ORDER — ASPIRIN EC 81 MG PO TBEC
81.0000 mg | DELAYED_RELEASE_TABLET | Freq: Every day | ORAL | Status: DC
  Administered 2012-02-08 – 2012-02-09 (×2): 81 mg via ORAL
  Filled 2012-02-08 (×2): qty 1

## 2012-02-08 MED ORDER — PANTOPRAZOLE SODIUM 40 MG PO TBEC
40.0000 mg | DELAYED_RELEASE_TABLET | Freq: Every day | ORAL | Status: DC
  Administered 2012-02-09: 40 mg via ORAL
  Filled 2012-02-08 (×2): qty 1

## 2012-02-08 MED ORDER — COLCHICINE 0.6 MG PO TABS
0.6000 mg | ORAL_TABLET | Freq: Every day | ORAL | Status: DC
  Filled 2012-02-08 (×2): qty 1

## 2012-02-08 MED ORDER — ONDANSETRON HCL 4 MG/2ML IJ SOLN: 4.0000 mg | Freq: Four times a day (QID) | INTRAMUSCULAR | Status: DC | PRN

## 2012-02-08 MED ORDER — CLONIDINE HCL 0.2 MG PO TABS
0.2000 mg | ORAL_TABLET | Freq: Every day | ORAL | Status: DC
  Administered 2012-02-09: 0.2 mg via ORAL
  Filled 2012-02-08: qty 1

## 2012-02-08 MED ORDER — INSULIN ASPART 100 UNIT/ML ~~LOC~~ SOLN
0.0000 [IU] | Freq: Three times a day (TID) | SUBCUTANEOUS | Status: DC
  Administered 2012-02-08: 1 [IU] via SUBCUTANEOUS
  Administered 2012-02-09: 3 [IU] via SUBCUTANEOUS

## 2012-02-08 MED ORDER — METFORMIN HCL 500 MG PO TABS
500.0000 mg | ORAL_TABLET | Freq: Every day | ORAL | Status: DC
  Filled 2012-02-08: qty 1

## 2012-02-08 MED ORDER — HYDROCHLOROTHIAZIDE 25 MG PO TABS
25.0000 mg | ORAL_TABLET | Freq: Every day | ORAL | Status: DC
  Administered 2012-02-09: 25 mg via ORAL
  Filled 2012-02-08: qty 1

## 2012-02-08 MED ORDER — ASPIRIN 81 MG PO TABS: 81.0000 mg | ORAL_TABLET | Freq: Every day | ORAL | Status: DC

## 2012-02-08 MED ORDER — METOPROLOL SUCCINATE ER 100 MG PO TB24
100.0000 mg | ORAL_TABLET | Freq: Every day | ORAL | Status: DC
  Administered 2012-02-08: 100 mg via ORAL
  Filled 2012-02-08 (×2): qty 1

## 2012-02-08 MED ORDER — PRAVASTATIN SODIUM 20 MG PO TABS
20.0000 mg | ORAL_TABLET | Freq: Every day | ORAL | Status: DC
  Administered 2012-02-08 – 2012-02-09 (×2): 20 mg via ORAL
  Filled 2012-02-08 (×2): qty 1

## 2012-02-08 MED ORDER — ONDANSETRON HCL 4 MG PO TABS: 4.0000 mg | ORAL_TABLET | Freq: Four times a day (QID) | ORAL | Status: DC | PRN

## 2012-02-08 MED ORDER — GUAIFENESIN-DM 100-10 MG/5ML PO SYRP
5.0000 mL | ORAL_SOLUTION | ORAL | Status: DC | PRN
  Administered 2012-02-08 – 2012-02-09 (×2): 5 mL via ORAL
  Filled 2012-02-08 (×2): qty 5

## 2012-02-08 MED ORDER — ACETAMINOPHEN 650 MG RE SUPP: 650.0000 mg | Freq: Four times a day (QID) | RECTAL | Status: DC | PRN

## 2012-02-08 MED ORDER — TRAZODONE HCL 50 MG PO TABS
50.0000 mg | ORAL_TABLET | Freq: Every evening | ORAL | Status: DC | PRN
  Filled 2012-02-08: qty 1

## 2012-02-08 MED ORDER — OSELTAMIVIR PHOSPHATE 75 MG PO CAPS
75.0000 mg | ORAL_CAPSULE | Freq: Two times a day (BID) | ORAL | Status: DC
  Administered 2012-02-08 – 2012-02-09 (×2): 75 mg via ORAL
  Filled 2012-02-08 (×3): qty 1

## 2012-02-08 MED ORDER — HEPARIN SODIUM (PORCINE) 5000 UNIT/ML IJ SOLN
5000.0000 [IU] | Freq: Three times a day (TID) | INTRAMUSCULAR | Status: DC
  Administered 2012-02-08 – 2012-02-09 (×2): 5000 [IU] via SUBCUTANEOUS
  Filled 2012-02-08 (×7): qty 1

## 2012-02-08 MED ORDER — BENZONATATE 100 MG PO CAPS
200.0000 mg | ORAL_CAPSULE | Freq: Two times a day (BID) | ORAL | Status: DC
  Administered 2012-02-08 – 2012-02-09 (×2): 200 mg via ORAL
  Filled 2012-02-08 (×3): qty 2

## 2012-02-08 MED ORDER — ACETAMINOPHEN 325 MG PO TABS: 650.0000 mg | ORAL_TABLET | Freq: Four times a day (QID) | ORAL | Status: DC | PRN

## 2012-02-08 MED ORDER — NIACIN ER (ANTIHYPERLIPIDEMIC) 500 MG PO TBCR
500.0000 mg | EXTENDED_RELEASE_TABLET | Freq: Every day | ORAL | Status: DC
  Administered 2012-02-08: 500 mg via ORAL
  Filled 2012-02-08 (×2): qty 1

## 2012-02-08 MED ORDER — LOSARTAN POTASSIUM 50 MG PO TABS
50.0000 mg | ORAL_TABLET | Freq: Every day | ORAL | Status: DC
  Administered 2012-02-08: 50 mg via ORAL
  Filled 2012-02-08 (×2): qty 1

## 2012-02-08 MED ORDER — SODIUM CHLORIDE 0.9 % IV SOLN
INTRAVENOUS | Status: DC
  Administered 2012-02-08: 17:00:00 via INTRAVENOUS

## 2012-02-08 MED ORDER — SIMVASTATIN 10 MG PO TABS: 10.0000 mg | ORAL_TABLET | Freq: Every day | ORAL | Status: DC

## 2012-02-08 NOTE — Progress Notes (Deleted)
  Subjective:    Patient ID: Benjamin Patel, male    DOB: [birth year], 79 y.o.   MRN: [id number]  HPI  Pt here for cough and chest congestion. Started feeling bad yesterday morning. Started with a non productive cough that is persistent. Unable to sleep last night. Has taken mucinex. No flu like sxs. No wheezing. No fever Has had a flu shot and pneumonia vaccine.  Right foot pain. H/O gout  Review of Systems     Objective:   Physical Exam        Assessment & Plan:

## 2012-02-08 NOTE — H&P (Signed)
Family Medicine Teaching [facility] Admission History and Physical  Patient name: Benjamin Patel Medical record number: [id number] Date of birth: 1927-04-21 Age: 79 y.o. Gender: male  Primary Care Provider: Lucilla Edin, MD  Chief Complaint: Cough History of Present Illness: Benjamin Patel is a 79 y.o. year old male presenting with cough. The cough started yesterday morning, and it has been persistent since that time. Mininmally productive for mucous without any blood. Took mucinex x 2 with little relief.  Denies dyspnea, fever, laryngitis, rhinitis, and facial pain. Denies chest pain. He has a history bronchitis that was several years ago. Has a history of asthma that resolved by age 72 and has not had any asthma exacerbations as an adult.    Patient Active Problem List  Diagnosis  . Essential hypertension, benign  . Mixed hyperlipidemia  . Gout  . Type II or unspecified type diabetes mellitus without mention of complication, not stated as uncontrolled  . Carotid stenosis  . Cholelithiasis  . Renal insufficiency  . Renal artery stenosis   Past Medical History: Past Medical History  Diagnosis Date  . Essential hypertension, benign   . Mixed hyperlipidemia   . Asthma   . Gout   . Type II or unspecified type diabetes mellitus without mention of complication, not stated as uncontrolled   . Skin cancer   . Abnormal prostate exam   . Carotid stenosis   . Cholelithiasis   . Renal insufficiency   . Renal artery stenosis     right  . PAC (premature atrial contraction)   . PVCs (premature ventricular contractions)     Past Surgical History: No past surgical history on file.  Social History: History   Social History  . Marital Status: Married    Spouse Name: N/A    Number of Children: N/A  . Years of Education: N/A   Social History Main Topics  . Smoking status: Former Smoker    Quit date: 01/16/1976  . Smokeless tobacco: Not on file  . Alcohol Use: No  . Drug  Use: No  . Sexually Active: Not on file   Other Topics Concern  . Not on file   Social History Narrative  . No narrative on file    Family History: No family history on file.  Allergies: Allergies  Allergen Reactions  . Ace Inhibitors Cough  . Codeine Other (See Comments)    GI Upset  . Indomethacin Other (See Comments)    Elevated BP    No current facility-administered medications for this encounter.   Review Of Systems: Per HPI with the following additions: none Otherwise 12 point review of systems was performed and was unremarkable.  Physical Exam: Temp:  [98 F (36.7 C)-98.3 F (36.8 C)] 98.3 F (36.8 C) (01/24 1401) Pulse Rate:  [98-100] 100  (01/24 1401) Resp:  [18] 18  (01/24 1401) BP: (144-163)/(63-69) 144/69 mmHg (01/24 1401) SpO2:  [94 %-96 %] 96 % (01/24 1401) Weight:  [168 lb 6.4 oz (76.386 kg)] 168 lb 6.4 oz (76.386 kg) (01/24 0827)   General: alert, cooperative, appears stated age and no distress HEENT: PERRLA, extra ocular movement intact, sclera clear, anicteric, oropharynx clear, no lesions, neck supple with midline trachea and thyroid without masses Heart: S1, S2 normal, no murmur, rub or gallop, regular rate and rhythm Lungs: persistent cough, normal WOB, scattered rhonchi, no rales or wheezes, no dullness to percussion Abdomen: abdomen is soft without significant tenderness, masses, organomegaly or guarding Extremities: Right lower  extremity with healing desquamation and mild patch of non-tender, non warmth erythema; 2+ peripheral pulses and no edema of feet bilaterally Skin: normal except feet Neurology: normal without focal findings, mental status, speech normal, alert and oriented x3, PERLA and reflexes normal and symmetric  Labs and Imaging:  Results for orders placed in visit on 02/08/12 (from the past 24 hour(s))  POCT CBC     Status: Abnormal   Collection Time   02/08/12  9:35 AM      Component Value Range   WBC 20.3 (*) 4.6 - 10.2  K/uL   Lymph, poc 1.6  0.6 - 3.4   POC LYMPH PERCENT 7.8 (*) 10 - 50 %L   MID (cbc) 0.9  0 - 0.9   POC MID % 4.2  0 - 12 %M   POC Granulocyte 17.9 (*) 2 - 6.9   Granulocyte percent 88.0 (*) 37 - 80 %G   RBC 4.61 (*) 4.69 - 6.13 M/uL   Hemoglobin 13.8 (*) 14.1 - 18.1 g/dL   HCT, POC 16.1 (*) 09.6 - 53.7 %   MCV 92.6  80 - 97 fL   MCH, POC 29.9  27 - 31.2 pg   MCHC 32.3  31.8 - 35.4 g/dL   RDW, POC 04.5     Platelet Count, POC 290  142 - 424 K/uL   MPV 8.2  0 - 99.8 fL  POCT SKIN KOH     Status: Normal   Collection Time   02/08/12  9:35 AM      Component Value Range   Skin KOH, POC Negative    GLUCOSE, POCT (MANUAL RESULT ENTRY)     Status: Abnormal   Collection Time   02/08/12  9:35 AM      Component Value Range   POC Glucose 204 (*) 70 - 99 mg/dl  POCT INFLUENZA A/B     Status: Normal   Collection Time   02/08/12  9:46 AM      Component Value Range   Influenza A, POC Negative     Influenza B, POC Negative      Dg Chest 2 View  02/08/2012  *RADIOLOGY REPORT*  Clinical Data: Cough, shortness of breath.  CHEST - 2 VIEW  Comparison: None.  Findings: Hyperinflation of the lungs compatible with COPD. Nodular density projects over the right upper lobe with overlying adjacent pleural thickening.  I suspect this represents scarring, but follow-up is recommended.  Left lung is clear.  No confluent airspace opacities or effusions.  No acute bony abnormality.  IMPRESSION: COPD.  Nodular density projects over the right upper lobe.  This may reflect an area of scarring, but recommend follow up chest x-Patel for further evaluation with chest CT to exclude pulmonary nodule.   Original Report Authenticated By: Charlett Nose, M.D.       Assessment and Plan: Benjamin Patel is a 79 y.o. year old male presenting with cough and leukocytosis likely representing a viral URI.   # Upper Respiratory Illness - Likely viral as no evidence of consolidation on CXR; no hypoxemia or fever at admission so  comfortable just giving symptomatic treatment and observing - Robitussin and tessalon perles for cough - Repeat CXR if change in resp status  # Pulmonary Nodule, RUL - Seen on CXR on 1/24;  - Order CT chest with contrast after Creatinine checked  # HTN - Cont home meds clonidine, HCTZ, Metoprolol, Cozaar  # HLD - Cont statin  # Gout - No  evidence of acute flare - Cont colcrys  # DM, type 2 - Well controlled last A1C 6.1 - SSI  FENGI - regular diet, KVO IVF PPX - Heparin SQ TID DISPO - Admit for observation   Benjamin Raider. Clinton Sawyer, MD, MBA 02/08/2012, 2:23 PM Family Medicine Resident, PGY-2 [phone] pager   I have seen and examined this patient. I have discussed with Dr Clinton Patel.  I agree with their findings and plans as documented in their progress note.  Acute Issues 1. Acute cough, malaise.  - Leukocytosis WBC 20K - CHEST XRAY: Nodular density RUL - Tobacco >30 Pack-year. Quit > 30 years ago - History recurrent acute bronchitis.  Last attack 1 to 2 years ago. - History of Juvenile asthma that has not been symptomatic since approximately age 18 years old.  - (+) Rhinorrhea - No fever/chills/headache/sore throat/myalgias - negative Point of Care A/B flu swab.  -  Recommendations Symptomatic treatment of cough.  Pt is nauseous with codeine. CT chest without contrast to better characterize the possible RUL pulmonary nodule.  Given acute onset, leukocytosis, cough will start empiric Tamiflu (despite negative flu swab) and start droplet precautions.

## 2012-02-08 NOTE — Progress Notes (Signed)
  Subjective:    Patient ID: Benjamin Patel, male    DOB: 08-28-1927, 79 y.o.   MRN: [id number]  HPI patient apparently had a severe flareup of gout approximately 2 weeks ago involving his foot. There is difficulty in getting his medication but eventually he started Colcrys with significant improvement. His foot is much better now but the skin is now peeling. Patient enters today with significant chest congestion cough which has been dry. He feels very weak and fatigued. He denies fevers chills or myalgias. He has had a pneumococcal vaccine he had his flu shot for this year    Review of Systems     Objective:   Physical Exam patient appears ill but not toxic. He does have some rhinorrhea. His neck exam is supple his chest exam reveals questionable rales present on the left base cardiac exam is regular rate without murmurs. Examination of the right foot reveals it  peeling of the skin with  some skin discoloration there is no redness there is no tenderness there is no increased warmth to this area . [facility] reading (PRIMARY) by  Dr.Daub there is a retrocardiac infiltrate on the left Results for orders placed in visit on 02/08/12  POCT CBC      Component Value Range   WBC 20.3 (*) 4.6 - 10.2 K/uL   Lymph, poc 1.6  0.6 - 3.4   POC LYMPH PERCENT 7.8 (*) 10 - 50 %L   MID (cbc) 0.9  0 - 0.9   POC MID % 4.2  0 - 12 %M   POC Granulocyte 17.9 (*) 2 - 6.9   Granulocyte percent 88.0 (*) 37 - 80 %G   RBC 4.61 (*) 4.69 - 6.13 M/uL   Hemoglobin 13.8 (*) 14.1 - 18.1 g/dL   HCT, POC 40.9 (*) 81.1 - 53.7 %   MCV 92.6  80 - 97 fL   MCH, POC 29.9  27 - 31.2 pg   MCHC 32.3  31.8 - 35.4 g/dL   RDW, POC 91.4     Platelet Count, POC 290  142 - 424 K/uL   MPV 8.2  0 - 99.8 fL  POCT INFLUENZA A/B      Component Value Range   Influenza A, POC Negative     Influenza B, POC Negative    POCT SKIN KOH      Component Value Range   Skin KOH, POC Negative    GLUCOSE, POCT (MANUAL RESULT ENTRY)      Component  Value Range   POC Glucose 204 (*) 70 - 99 mg/dl          Assessment & Plan:  I'm concerned about this patient with pneumonia in the left lobe with an elevated white count due to his underlying comorbidities of diabetes, coronary artery disease, and hypertension. He is a nonsmoker. His sugar is not under good control at 204. We'll discuss with family medicine resident about a directed neck. The issue is the episode a couple weeks ago of redness pain and swelling involving the right foot and he has significant peeling of his skin in this area raising the question about the foot problem being secondary to a staph or strep cellulitis rather than gout.

## 2012-02-09 DIAGNOSIS — N139 Obstructive and reflux uropathy, unspecified: Secondary | ICD-10-CM

## 2012-02-09 DIAGNOSIS — I10 Essential (primary) hypertension: Secondary | ICD-10-CM

## 2012-02-09 DIAGNOSIS — M109 Gout, unspecified: Secondary | ICD-10-CM | POA: Diagnosis not present

## 2012-02-09 DIAGNOSIS — R339 Retention of urine, unspecified: Secondary | ICD-10-CM | POA: Diagnosis not present

## 2012-02-09 DIAGNOSIS — C3490 Malignant neoplasm of unspecified part of unspecified bronchus or lung: Secondary | ICD-10-CM

## 2012-02-09 DIAGNOSIS — E785 Hyperlipidemia, unspecified: Secondary | ICD-10-CM | POA: Diagnosis not present

## 2012-02-09 DIAGNOSIS — R911 Solitary pulmonary nodule: Secondary | ICD-10-CM | POA: Diagnosis not present

## 2012-02-09 DIAGNOSIS — J069 Acute upper respiratory infection, unspecified: Secondary | ICD-10-CM

## 2012-02-09 LAB — BASIC METABOLIC PANEL
BUN: 22 mg/dL (ref 6–23)
CO2: 27 mEq/L (ref 19–32)
Chloride: 95 mEq/L — ABNORMAL LOW (ref 96–112)
GFR calc non Af Amer: 66 mL/min — ABNORMAL LOW (ref >90)
Glucose, Bld: 184 mg/dL — ABNORMAL HIGH (ref 70–99)
Potassium: 3.8 mEq/L (ref 3.5–5.1)
Sodium: 135 mEq/L (ref 135–145)

## 2012-02-09 LAB — CBC
HCT: 38.4 % — ABNORMAL LOW (ref 39.0–52.0)
Hemoglobin: 13.4 g/dL (ref 13.0–17.0)
MCHC: 34.9 g/dL (ref 30.0–36.0)
WBC: 15.6 10*3/uL — ABNORMAL HIGH (ref 4.0–10.5)

## 2012-02-09 LAB — URINALYSIS, ROUTINE W REFLEX MICROSCOPIC
Glucose, UA: NEGATIVE mg/dL
Leukocytes, UA: NEGATIVE
Protein, ur: NEGATIVE mg/dL
Specific Gravity, Urine: 1.021 (ref 1.005–1.030)
Urobilinogen, UA: 0.2 mg/dL (ref 0.0–1.0)

## 2012-02-09 LAB — URINE MICROSCOPIC-ADD ON

## 2012-02-09 LAB — GLUCOSE, CAPILLARY: Glucose-Capillary: 175 mg/dL — ABNORMAL HIGH (ref 70–99)

## 2012-02-09 MED ORDER — OSELTAMIVIR PHOSPHATE 75 MG PO CAPS: 75.0000 mg | ORAL_CAPSULE | Freq: Two times a day (BID) | ORAL | Status: DC

## 2012-02-09 MED ORDER — GUAIFENESIN-DM 100-10 MG/5ML PO SYRP: 5.0000 mL | ORAL_SOLUTION | ORAL | Status: DC | PRN

## 2012-02-09 MED ORDER — BENZONATATE 200 MG PO CAPS: 200.0000 mg | ORAL_CAPSULE | Freq: Two times a day (BID) | ORAL | Status: DC

## 2012-02-09 NOTE — Discharge Summary (Signed)
Family Medicine Teaching [facility] Discharge Summary  Patient name: Benjamin Patel Medical record number: [id number] Date of birth: 1927-01-31 Age: 79 y.o. Gender: male Date of Admission: 02/08/2012  Date of Discharge: 02/09/2012 Admitting Physician: Tobin Chad, MD  Primary Care Provider: Lucilla Edin, MD  Indication for Hospitalization: Cough Discharge Diagnoses:  1. Upper respiratory infection 2. Pulmonary nodule 3. Urinary retention 4. HTN 5. HLD 6. Gout 7. DM2  Consultations: None  Significant Labs and Imaging:   Lab 02/09/12 0845 02/08/12 1520 02/08/12 0935  HGB 13.4 13.3 13.8*  HCT 38.4* 38.2* 42.7*  WBC 15.6* 18.1* 20.3*  PLT 233 242 --    Lab 02/09/12 0844 02/08/12 1520 02/08/12 0926  NA 135 -- 137  K 3.8 -- 4.7  CL 95* -- 97  CO2 27 -- 27  GLUCOSE 184* -- 201*  BUN 22 -- 28*  CREATININE 1.02 1.14 1.23  CALCIUM 10.5 -- 10.4  MG -- -- --  PHOS -- -- --   CT Chest 02/08/2012  IMPRESSION:  1. There is a nodule in the superior segment of the left lower  lobe posteriorly measures 2 x 1.7 cm highly suspicious for  malignancy. Further evaluation with PET scan and/or biopsy is  recommended.  2. No adenopathy.  3. No acute infiltrate or pulmonary edema.  4. Bilateral upper lobe emphysematous changes.  5. Calcified gallstone within gallbladder measures 1.2 cm.   CXR 02/08/2012  IMPRESSION:  COPD.  Nodular density projects over the right upper lobe. This may  reflect an area of scarring, but recommend follow up chest x-ray  for further evaluation with chest CT to exclude pulmonary nodule.  Procedures: None  Brief Hospital Course:  Benjamin Patel is a 79 y.o. year old male presenting with cough and leukocytosis likely representing a viral URI.   Upper Respiratory infection.  After reviewing his chart and examining the patient we felt that flu was very likely despite his negative flu PCR. We treated him with Tamiflu, robitussin, and tessalon  perles. A CT was obtained to clarify his CXR and uncovered a pulmonary nodule that is suspicious for malignancy. His WBC continued to trend down and the patient was comfortable giong home with close follow up next week.   Urinary retention  He complained that he had urinary hesitancy and urgency beginning with admission to the hospiatal. I did a prostate exam which revealed a large firm smooth prostate, a UA was obtained, his meds were reviewed for the possibility of anticholinergic side effects, and a post void residual was obtained that showed 0 mL of residual urine in his bladder. The UA was not indicative of infection.   HTN  Elevated throughout admission with 142-163/62-73. We continued his home doses of clonidine, HCTZ, Metoprolol, and Cozaar.   HLD  Continued his statin.    Gout  On exam he did not have any indication of an acute gout flare. He did have some erythema and desquamation of his R foot but did not have any warmth or tenderness of any joints. We continued his Colcrys.   DM, type 2  Well controlled with a  A1C of 6.1, he was managed while IP with SSI.    Dispo: Home  Discharge Medications:    Medication List     As of 02/09/2012  9:22 PM    TAKE these medications         aspirin EC 81 MG tablet   Take 81 mg by mouth every  evening.      benzonatate 200 MG capsule   Commonly known as: TESSALON   Take 1 capsule (200 mg total) by mouth 2 (two) times daily.      cloNIDine 0.2 MG tablet   Commonly known as: CATAPRES   Take 0.2 mg by mouth 2 (two) times daily.      colchicine 0.6 MG tablet   Take 0.6 mg by mouth 3 (three) times daily as needed. For gout (colcrys)      Fish Oil 1200 MG Caps   Take 1,200 mg by mouth 2 (two) times daily.      guaiFENesin-dextromethorphan 100-10 MG/5ML syrup   Commonly known as: ROBITUSSIN DM   Take 5 mLs by mouth every 4 (four) hours as needed for cough.      hydrochlorothiazide 12.5 MG capsule   Commonly known as: MICROZIDE    Take 12.5 mg by mouth daily.      losartan 25 MG tablet   Commonly known as: COZAAR   Take 25 mg by mouth every evening.      metFORMIN 500 MG tablet   Commonly known as: GLUCOPHAGE   Take 500 mg by mouth 2 (two) times daily with a meal.      metoprolol succinate 100 MG 24 hr tablet   Commonly known as: TOPROL-XL   Take 100 mg by mouth every evening. Take with or immediately following a meal.      niacin 500 MG CR tablet   Commonly known as: NIASPAN   Take 500 mg by mouth at bedtime.      oseltamivir 75 MG capsule   Commonly known as: TAMIFLU   Take 1 capsule (75 mg total) by mouth 2 (two) times daily.      pravastatin 20 MG tablet   Commonly known as: PRAVACHOL   Take 20 mg by mouth every evening.       Issues for Follow Up:  - Schedule him with pulmonary nodule clinic for evaluation of the nodule shown by CT.  - F/u BP as he was slightly elevated on several BP meds during IP.  - F/u on wether or not his symptom ps of urinary retention have continued.   Outstanding Results: None  Discharge Instructions: Please refer to Patient Instructions section of EMR for full details.  Patient was counseled important signs and symptoms that should prompt return to medical care, changes in medications, dietary instructions, activity restrictions, and follow up appointments.       Follow-up Information    Follow up with DAUB, STEVE A, MD. Call in 1 week.   Contact information:   [address] [place] [address] [phone]          Discharge Condition: Carlena Sax, MD 02/09/2012, 9:22 PM

## 2012-02-09 NOTE — Care Management Utilization Note (Signed)
UR completed 

## 2012-02-09 NOTE — Progress Notes (Signed)
Post Void Residual Measured, which showed 0ml.

## 2012-02-09 NOTE — Progress Notes (Signed)
I examined this patient and discussed the care plan with Dr Bradshaw and the FPTS team and agree with assessment and plan as documented in the progress note above.  

## 2012-02-09 NOTE — Progress Notes (Signed)
Family Medicine Teaching Service Daily Progress Note Service Page: [phone]  Subjective:  Patient feels about the same this am. He's had some improvement in his cough with tessalon perles and robitussin. Also c/o difficulty urinating, described as hesitancy, and urinary urgency just starting yesterday. Difficulty sleeping last night due to cough.   Objective: Temp:  [97.4 F (36.3 C)-98.3 F (36.8 C)] 98.3 F (36.8 C) (01/25 0457) Pulse Rate:  [93-101] 99  (01/25 0457) Resp:  [17-18] 18  (01/25 0457) BP: (142-163)/(62-71) 142/62 mmHg (01/25 0457) SpO2:  [93 %-96 %] 93 % (01/25 0457) Weight:  [165 lb 4.8 oz (74.98 kg)-168 lb 6.4 oz (76.386 kg)] 165 lb 4.8 oz (74.98 kg) (01/25 0457)  Exam: Gen: NAD, alert, cooperative with exam HEENT: NCAT, EOMI, PERRL CV: RRR, good S1/S2, no murmur Resp: CTABL, no wheezes, non-labored, wet sounding cough Abd: SNTND, BS present, no guarding, No CVA tenderness Ext: Right lower extremity with healing desquamation and mild patch of non-tender, non warmth erythema; 2+ peripheral pulses and no edema of feet bilaterally Neuro: Alert and oriented, No gross deficits Prostate: Large, firm on R lobe, smooth and without nodules  I have reviewed the patient's medications, labs, imaging, and diagnostic testing.  Notable results are summarized below.  CBC BMET   Lab 02/08/12 1520 02/08/12 0935  WBC 18.1* 20.3*  HGB 13.3 13.8*  HCT 38.2* 42.7*  PLT 242 --    Lab 02/08/12 1520 02/08/12 0926  NA -- 137  K -- 4.7  CL -- 97  CO2 -- 27  BUN -- 28*  CREATININE 1.14 1.23  GLUCOSE -- 201*  CALCIUM -- 10.4     Imaging/Diagnostic Tests: CT Chest 02/08/2012 IMPRESSION:  1. There is a nodule in the superior segment of the left lower  lobe posteriorly measures 2 x 1.7 cm highly suspicious for  malignancy. Further evaluation with PET scan and/or biopsy is  recommended.  2. No adenopathy.  3. No acute infiltrate or pulmonary edema.  4. Bilateral upper lobe  emphysematous changes.  5. Calcified gallstone within gallbladder measures 1.2 cm.  CXR 02/08/2012 IMPRESSION:  COPD.  Nodular density projects over the right upper lobe. This may  reflect an area of scarring, but recommend follow up chest x-ray  for further evaluation with chest CT to exclude pulmonary nodule.  Plan: Benjamin Patel is a 79 y.o. year old male presenting with cough and leukocytosis likely representing a viral URI.   Upper Respiratory Illness  - Likely viral as no evidence of consolidation on CXR; no hypoxemia or fever overnight - Tamiflu empirically  - Robitussin and tessalon perles for cough  - CBC this am not collected yet.   Pulmonary Nodule, RUL  - 2 cm by 1.7 cm, highly suspicious for malignancy  - Consider CT guided biopsy vs bronchoscopic Bx outpatient  Urinary retention - With new onset symptoms this is possibly a UTI, considering prostatic pathology vs BPH with large smooth prostate - DC robitussin as it may be contributing - UA, post void residual, consider flomax OP if symptoms persist.   HTN  - elevated at 142-163/62-63 - Cont home meds clonidine, HCTZ, Metoprolol, Cozaar   HLD  - Continue statin   Gout  - No evidence of acute flare  - Continue colcrys   DM, type 2  - Well controlled last A1C 6.1, CBGs 142-164 - SSI   FENGI - regular diet, KVO IVF  PPX - Heparin SQ TID  DISPO - likely home today  Kevin Fenton, MD 02/09/2012, 6:15 AM

## 2012-02-10 ENCOUNTER — Encounter: Payer: Self-pay | Admitting: Emergency Medicine

## 2012-02-10 DIAGNOSIS — C3490 Malignant neoplasm of unspecified part of unspecified bronchus or lung: Secondary | ICD-10-CM | POA: Insufficient documentation

## 2012-02-10 NOTE — Discharge Summary (Signed)
I examined this patient and discussed the care plan with Dr Bradshaw and the FPTS team and agree with assessment and plan as documented in the discharge note above.  

## 2012-02-11 ENCOUNTER — Telehealth: Payer: Self-pay

## 2012-02-11 ENCOUNTER — Other Ambulatory Visit (HOSPITAL_COMMUNITY): Payer: Self-pay | Admitting: Family Medicine

## 2012-02-11 NOTE — Telephone Encounter (Signed)
Pt requesting more meds for gout.   CBN:  [phone] (M)

## 2012-02-11 NOTE — Telephone Encounter (Signed)
I'm concerned about this patient with pneumonia in the left lobe with an elevated white count due to his underlying comorbidities of diabetes, coronary artery disease, and hypertension. He is a nonsmoker. His sugar is not under good control at 204. We'll discuss with family medicine resident about a directed neck. The issue is the episode a couple weeks ago of redness pain and swelling involving the right foot and he has significant peeling of his skin in this area raising the question about the foot problem being secondary to a staph or strep cellulitis rather than gout.  This is from Dr Cleta Alberts note from last week, message being sent to Dr Cleta Alberts to advise, do you want him to have meds for gout?

## 2012-02-11 NOTE — Telephone Encounter (Signed)
Operation and let him know he has an appointment to see me first thing in the morning. We will discuss his gout medications at that time.[phone]

## 2012-02-12 ENCOUNTER — Encounter: Payer: Self-pay | Admitting: Emergency Medicine

## 2012-02-12 ENCOUNTER — Telehealth: Payer: Self-pay | Admitting: Radiology

## 2012-02-12 ENCOUNTER — Ambulatory Visit (INDEPENDENT_AMBULATORY_CARE_PROVIDER_SITE_OTHER): Payer: Medicare Other | Admitting: Emergency Medicine

## 2012-02-12 VITALS — BP 140/56 | HR 105 | Temp 97.4°F | Resp 20 | Ht 68.0 in | Wt 164.4 lb

## 2012-02-12 DIAGNOSIS — M109 Gout, unspecified: Secondary | ICD-10-CM | POA: Diagnosis not present

## 2012-02-12 DIAGNOSIS — D72829 Elevated white blood cell count, unspecified: Secondary | ICD-10-CM | POA: Diagnosis not present

## 2012-02-12 DIAGNOSIS — R222 Localized swelling, mass and lump, trunk: Secondary | ICD-10-CM

## 2012-02-12 DIAGNOSIS — R05 Cough: Secondary | ICD-10-CM

## 2012-02-12 DIAGNOSIS — R918 Other nonspecific abnormal finding of lung field: Secondary | ICD-10-CM

## 2012-02-12 LAB — POCT CBC
Granulocyte percent: 73.5 %G (ref 37–80)
Hemoglobin: 12.4 g/dL — AB (ref 14.1–18.1)
MID (cbc): 0.9 (ref 0–0.9)
MPV: 8.8 fL (ref 0–99.8)
POC MID %: 7.9 %M (ref 0–12)
Platelet Count, POC: 322 10*3/uL (ref 142–424)
RBC: 4.2 M/uL — AB (ref 4.69–6.13)

## 2012-02-12 LAB — URIC ACID: Uric Acid, Serum: 10 mg/dL — ABNORMAL HIGH (ref 4.0–7.8)

## 2012-02-12 MED ORDER — ALBUTEROL SULFATE HFA 108 (90 BASE) MCG/ACT IN AERS: INHALATION_SPRAY | RESPIRATORY_TRACT | Status: DC

## 2012-02-12 NOTE — Progress Notes (Signed)
Subjective:    Patient ID: Benjamin Patel, male    DOB: Jul 21, 1927, 79 y.o.   MRN: [id number]  HPI patient in for followup recent admission with respiratory illness. He was treated with Tamiflu. CT scan of the lung revealed a suspicious lesion approximately 2 x 2 centimeters in the left upper lobe. Since discharge she has not been able to sleep due to cough. He's not had any fever or chills. He overall is feeling better but cannot  stop his coughing.    Review of Systems     Objective:   Physical Exam HEENT exam is unremarkable. Throat is normal. Breath sounds are symmetrical no rales are heard. Heart is regular rate without murmurs.   Results for orders placed during the hospital encounter of 02/08/12  CBC      Component Value Range   WBC 18.1 (*) 4.0 - 10.5 K/uL   RBC 4.37  4.22 - 5.81 MIL/uL   Hemoglobin 13.3  13.0 - 17.0 g/dL   HCT 10.2 (*) 72.5 - 36.6 %   MCV 87.4  78.0 - 100.0 fL   MCH 30.4  26.0 - 34.0 pg   MCHC 34.8  30.0 - 36.0 g/dL   RDW 44.0  34.7 - 42.5 %   Platelets 242  150 - 400 K/uL  CREATININE, SERUM      Component Value Range   Creatinine, Ser 1.14  0.50 - 1.35 mg/dL   GFR calc non Af Amer 58 (*) >90 mL/min   GFR calc Af Amer 67 (*) >90 mL/min  BASIC METABOLIC PANEL      Component Value Range   Sodium 135  135 - 145 mEq/L   Potassium 3.8  3.5 - 5.1 mEq/L   Chloride 95 (*) 96 - 112 mEq/L   CO2 27  19 - 32 mEq/L   Glucose, Bld 184 (*) 70 - 99 mg/dL   BUN 22  6 - 23 mg/dL   Creatinine, Ser 9.56  0.50 - 1.35 mg/dL   Calcium 38.7  8.4 - 56.4 mg/dL   GFR calc non Af Amer 66 (*) >90 mL/min   GFR calc Af Amer 77 (*) >90 mL/min  GLUCOSE, CAPILLARY      Component Value Range   Glucose-Capillary 142 (*) 70 - 99 mg/dL  GLUCOSE, CAPILLARY      Component Value Range   Glucose-Capillary 164 (*) 70 - 99 mg/dL   Comment 1 Notify RN    CBC      Component Value Range   WBC 15.6 (*) 4.0 - 10.5 K/uL   RBC 4.40  4.22 - 5.81 MIL/uL   Hemoglobin 13.4  13.0 - 17.0  g/dL   HCT 33.2 (*) 95.1 - 88.4 %   MCV 87.3  78.0 - 100.0 fL   MCH 30.5  26.0 - 34.0 pg   MCHC 34.9  30.0 - 36.0 g/dL   RDW 16.6  06.3 - 01.6 %   Platelets 233  150 - 400 K/uL  URINALYSIS, ROUTINE W REFLEX MICROSCOPIC      Component Value Range   Color, Urine YELLOW  YELLOW   APPearance CLEAR  CLEAR   Specific Gravity, Urine 1.021  1.005 - 1.030   pH 5.0  5.0 - 8.0   Glucose, UA NEGATIVE  NEGATIVE mg/dL   Hgb urine dipstick MODERATE (*) NEGATIVE   Bilirubin Urine NEGATIVE  NEGATIVE   Ketones, ur NEGATIVE  NEGATIVE mg/dL   Protein, ur NEGATIVE  NEGATIVE mg/dL  Urobilinogen, UA 0.2  0.0 - 1.0 mg/dL   Nitrite NEGATIVE  NEGATIVE   Leukocytes, UA NEGATIVE  NEGATIVE  GLUCOSE, CAPILLARY      Component Value Range   Glucose-Capillary 175 (*) 70 - 99 mg/dL  URINE MICROSCOPIC-ADD ON      Component Value Range   RBC / HPF 3-6  <3 RBC/hpf   Urine-Other AMORPHOUS URATES/PHOSPHATES    GLUCOSE, CAPILLARY      Component Value Range   Glucose-Capillary 157 (*) 70 - 99 mg/dL      Results for orders placed in visit on 02/12/12  POCT CBC      Component Value Range   WBC 11.6 (*) 4.6 - 10.2 K/uL   Lymph, poc 2.2  0.6 - 3.4   POC LYMPH PERCENT 18.6  10 - 50 %L   MID (cbc) 0.9  0 - 0.9   POC MID % 7.9  0 - 12 %M   POC Granulocyte 8.5 (*) 2 - 6.9   Granulocyte percent 73.5  37 - 80 %G   RBC 4.20 (*) 4.69 - 6.13 M/uL   Hemoglobin 12.4 (*) 14.1 - 18.1 g/dL   HCT, POC 16.1 (*) 09.6 - 53.7 %   MCV 92.7  80 - 97 fL   MCH, POC 29.5  27 - 31.2 pg   MCHC 31.9  31.8 - 35.4 g/dL   RDW, POC 04.5     Platelet Count, POC 322  142 - 424 K/uL   MPV 8.8  0 - 99.8 fL   Assessment & Plan:  He will be on  Cough meds and Tessalon Perles and albuterol inhaler for cough. I've scheduled him for a PET scan and referral to cardiothoracic surgeon to evaluate the left upper lobe lesion .

## 2012-02-12 NOTE — Telephone Encounter (Signed)
Any time before 3 would be good

## 2012-02-12 NOTE — Telephone Encounter (Signed)
Spoke to patient about coming in, he is coming to see Dr Cleta Alberts today at 2pm.

## 2012-02-12 NOTE — Telephone Encounter (Signed)
Called patient he can come in this afternoon, what time is good for your schedule?

## 2012-02-13 NOTE — Addendum Note (Signed)
Addended by: Thelma Barge D on: 02/13/2012 08:38 AM   Modules accepted: Orders

## 2012-02-16 ENCOUNTER — Telehealth: Payer: Self-pay

## 2012-02-16 DIAGNOSIS — R918 Other nonspecific abnormal finding of lung field: Secondary | ICD-10-CM

## 2012-02-16 NOTE — Telephone Encounter (Signed)
PATIENT STATES HE NEEDS DR. DAUB TO PRESCRIBE HIM SOMETHING FOR THE CONGESTION IN HIS LUNGS. HE SAID HE HAS NOT SLEPT IN 3 WEEKS. HE ALSO WOULD LIKE TO GET THE RESULTS OF THE LAB TEST HE TOOK ON HIS LAST VISIT. BEST PHONE [phone] (HOME)    PHARMACY CHOICE IS [facility] ON [address].   MBC

## 2012-02-18 ENCOUNTER — Telehealth: Payer: Self-pay | Admitting: Emergency Medicine

## 2012-02-18 MED ORDER — DEXTROMETHORPHAN-GUAIFENESIN 10-100 MG/5ML PO LIQD: ORAL | Status: DC

## 2012-02-18 NOTE — Telephone Encounter (Signed)
Pulmonary referral made for Urgent referral/ please call patient.

## 2012-02-18 NOTE — Telephone Encounter (Signed)
I have already discussed this w/pt. Pt in agreement. See previous phone message.

## 2012-02-18 NOTE — Telephone Encounter (Signed)
Please call patient and tell him I am trying to get him an appointment with the pulmonary specialist to see if they can help him with this terrible cough he's having.

## 2012-02-18 NOTE — Telephone Encounter (Signed)
He needs to be on CPAP son which can be refilled if necessary. He needs to be on [employer] 1-2-3 times a day. He needs to take Mucinex twice a day. I have made him an appointment to see the pulmonary specialist and see if they have some other recommendations of what we can do to help. Please put an orders only referral in to pulmonary. Please make an urgent referral so he can be seen within the next 2-3 days.

## 2012-02-18 NOTE — Telephone Encounter (Signed)
Called him, is he taking Mucinex? He states he is not, but will go back on it today. Do you recommend anything else for this? When do you want him to follow up in the office? Labs reviewed, his questions answered on this. His appt with [facility] is 02/26/12

## 2012-02-18 NOTE — Telephone Encounter (Signed)
Called pt and gave him instr's from Dr Cleta Alberts and sent in RF for Tussin. Also called pharmacy and asked them to get his RF of Tessalon Perles ready for pt to p/up. Pt agreed to cont mucinex also. Pt agreed to pulmonologist referral.

## 2012-02-21 ENCOUNTER — Ambulatory Visit (HOSPITAL_COMMUNITY): Payer: Medicare Other

## 2012-02-21 ENCOUNTER — Encounter: Payer: Self-pay | Admitting: Internal Medicine

## 2012-02-21 ENCOUNTER — Ambulatory Visit (INDEPENDENT_AMBULATORY_CARE_PROVIDER_SITE_OTHER): Payer: Medicare Other | Admitting: Internal Medicine

## 2012-02-21 VITALS — BP 132/68 | HR 62 | Temp 98.2°F | Ht 68.0 in | Wt 160.8 lb

## 2012-02-21 DIAGNOSIS — R911 Solitary pulmonary nodule: Secondary | ICD-10-CM | POA: Diagnosis not present

## 2012-02-21 MED ORDER — AMOXICILLIN-POT CLAVULANATE 875-125 MG PO TABS: 1.0000 | ORAL_TABLET | Freq: Two times a day (BID) | ORAL | Status: DC

## 2012-02-21 NOTE — Patient Instructions (Addendum)
We need to postpone your PET scan and your Thoracic Surgery  Augmentin 875 mg twice daily x 10days  mucinex dm 1200mg  every 12 hours as needed for cough congestion  GERD (REFLUX)  is an extremely common cause of respiratory symptoms like a bad cough , many times with no significant heartburn at all.    It can be treated with medication, but also with lifestyle changes including avoidance of late meals, excessive alcohol, smoking cessation, and avoid fatty foods, chocolate, peppermint, colas, red wine, and acidic juices such as orange juice.  NO MINT OR MENTHOL PRODUCTS SO NO COUGH DROPS  USE SUGARLESS CANDY INSTEAD (jolley ranchers or Stover's)  NO OIL BASED VITAMINS - use powdered substitutes.    Please schedule a follow up office visit in 2 weeks, sooner if needed with cxr on return

## 2012-02-21 NOTE — Assessment & Plan Note (Signed)
-   Chest CT 02/08/12  Bilateral upper lobe centrilobular emphysematous  changes are noted. Consistent with history there is a spiculated  nodule in the superior segment of the left lower lobe posteriorly  measures two by 1.7 cm. There is adjacent focal bronchiectasis or  a small emphysematous bulla  Extended discussion with pt and wife Menerva >  The abrupt onset of symptoms with brown mucus and bronchiectasis on cxr is suggestive of an inflammatory / infectious source of symptoms ? Directly related to the nodule in the LL but in any case a PET Scan at this point won't be able to distinguish this ddx so rec 10 days of augmentin then f/u in 2 weeks  Discussed in detail all the  indications, usual  risks and alternatives  relative to the benefits with patient who agrees to proceed with conservative f/u at age 79 and hope this proves to be benign.

## 2012-02-21 NOTE — Progress Notes (Signed)
  Subjective:    Patient ID: Benjamin Patel, male    DOB: January 21, 1927  MRN: [id number]  HPI  7 yowm quit smoking 1978 p father died due to smoking referred by Dr Cleta Alberts for eval of lung nodule.   02/21/2012 1st pulmonary eval cc intermittent bronchitis x decades not more than once a year then abruptly x 3 weeks prior to OV  > cough rattling   and sense of chest tightness and doe all better p stop   better p mucinex dm with brown mucus production but only sob with cough and no hemoptysis or cp. eval suggested RUL nodule so CT chest done suggesting bronchiectasis and LLL nodule.  He has no risk factors for aspiration and no teeth or active sinus complaints or pleuritic cp.  Sleeping ok without nocturnal  or early am exacerbation  of respiratory  c/o's or need for noct saba. Also denies any obvious fluctuation of symptoms with weather or environmental changes or other aggravating or alleviating factors except as outlined above   Review of Systems  Constitutional: Positive for unexpected weight change. Negative for fever.  HENT: Positive for congestion. Negative for ear pain, nosebleeds, sore throat, rhinorrhea, sneezing, trouble swallowing, dental problem, postnasal drip and sinus pressure.   Eyes: Negative for redness and itching.  Respiratory: Positive for cough and shortness of breath. Negative for chest tightness and wheezing.   Cardiovascular: Negative for palpitations and leg swelling.  Gastrointestinal: Negative for nausea and vomiting.  Genitourinary: Negative for dysuria.  Musculoskeletal: Negative for joint swelling.  Skin: Negative for rash.  Neurological: Negative for headaches.  Hematological: Does not bruise/bleed easily.  Psychiatric/Behavioral: Negative for dysphoric mood. The patient is not nervous/anxious.        Objective:   Physical Exam  [name] Wt Readings from Last 3 Encounters:  02/21/12 160 lb 12.8 oz (72.938 kg)  02/12/12 164 lb 6.4 oz  (74.571 kg)  02/09/12 165 lb 4.8 oz (74.98 kg)   HEENT:  Edentulous, nl urbinates, and orophanx. Nl external ear canals without cough reflex   NECK :  without JVD/Nodes/TM/ nl carotid upstrokes bilaterally   LUNGS: no acc muscle use, clear to A and P bilaterally without cough on insp or exp maneuvers   CV:  RRR  no s3 or murmur or increase in P2, no edema   ABD:  soft and nontender with nl excursion in the supine position. No bruits or organomegaly, bowel sounds nl  MS:  warm without deformities, calf tenderness, cyanosis or clubbing  SKIN: warm and dry without lesions    NEURO:  alert, approp, no deficits   CTchest 02/08/12 Bilateral upper lobe centrilobular emphysematous  changes are noted. Consistent with history there is a spiculated  nodule in the superior segment of the left lower lobe posteriorly  measures two by 1.7 cm. There is adjacent focal bronchiectasis or  a small emphysematous bulla      Assessment & Plan:

## 2012-02-26 ENCOUNTER — Institutional Professional Consult (permissible substitution): Payer: Medicare Other | Admitting: Thoracic Surgery (Cardiothoracic Vascular Surgery)

## 2012-02-26 NOTE — Progress Notes (Unsigned)
  HPI:  Patient returns for routine postoperative follow-up having undergone  The patient's early postoperative recovery while in the hospital was notable for Since hospital discharge the patient reports   Current Outpatient Prescriptions  Medication Sig Dispense Refill  . albuterol (PROVENTIL HFA;VENTOLIN HFA) 108 (90 BASE) MCG/ACT inhaler Use 1 puff every 4 hours as needed for cough  1 Inhaler  1  . amoxicillin-clavulanate (AUGMENTIN) 875-125 MG per tablet Take 1 tablet by mouth 2 (two) times daily.  20 tablet  0  . aspirin EC 81 MG tablet Take 81 mg by mouth every evening.      . benzonatate (TESSALON) 200 MG capsule Take 1 capsule (200 mg total) by mouth 2 (two) times daily.  20 capsule  1  . cloNIDine (CATAPRES) 0.2 MG tablet Take 0.2 mg by mouth 2 (two) times daily.      Marland Kitchen dextromethorphan-guaiFENesin ([facility] TUSSIN DM) 10-100 MG/5ML liquid Take 1 teaspoonful by mouth every 4 hours as needed for cough.  118 mL  0  . hydrochlorothiazide (MICROZIDE) 12.5 MG capsule Take 12.5 mg by mouth daily.      Marland Kitchen losartan (COZAAR) 25 MG tablet Take 25 mg by mouth every evening.      . metFORMIN (GLUCOPHAGE) 500 MG tablet Take 500 mg by mouth 2 (two) times daily with a meal.      . metoprolol succinate (TOPROL-XL) 100 MG 24 hr tablet Take 100 mg by mouth every evening. Take with or immediately following a meal.      . niacin (NIASPAN) 500 MG CR tablet Take 500 mg by mouth at bedtime.      . pravastatin (PRAVACHOL) 20 MG tablet Take 20 mg by mouth every evening.       No current facility-administered medications for this visit.    Physical Exam  Diagnostic Tests:   Impression:  Plan:

## 2012-03-04 ENCOUNTER — Other Ambulatory Visit: Payer: Self-pay | Admitting: Emergency Medicine

## 2012-03-04 ENCOUNTER — Other Ambulatory Visit: Payer: Self-pay | Admitting: Physician Assistant

## 2012-03-06 ENCOUNTER — Ambulatory Visit (HOSPITAL_COMMUNITY): Payer: Medicare Other

## 2012-03-10 ENCOUNTER — Ambulatory Visit (INDEPENDENT_AMBULATORY_CARE_PROVIDER_SITE_OTHER): Payer: Medicare Other | Admitting: Internal Medicine

## 2012-03-10 ENCOUNTER — Ambulatory Visit (INDEPENDENT_AMBULATORY_CARE_PROVIDER_SITE_OTHER)
Admission: RE | Admit: 2012-03-10 | Discharge: 2012-03-10 | Disposition: A | Discharge status: Home / Self Care | Payer: Medicare Other | Source: Ambulatory Visit | Attending: Internal Medicine | Admitting: Internal Medicine

## 2012-03-10 ENCOUNTER — Encounter: Payer: Self-pay | Admitting: Internal Medicine

## 2012-03-10 VITALS — BP 110/62 | HR 66 | Temp 97.5°F | Ht 68.5 in | Wt 162.8 lb

## 2012-03-10 DIAGNOSIS — R918 Other nonspecific abnormal finding of lung field: Secondary | ICD-10-CM

## 2012-03-10 DIAGNOSIS — J449 Chronic obstructive pulmonary disease, unspecified: Secondary | ICD-10-CM | POA: Diagnosis not present

## 2012-03-10 DIAGNOSIS — R911 Solitary pulmonary nodule: Secondary | ICD-10-CM | POA: Diagnosis not present

## 2012-03-10 NOTE — Patient Instructions (Addendum)
Continue to observe the dietary restrictions for now   Please schedule a follow up office visit in 6 weeks, call sooner if needed with cxr on return

## 2012-03-10 NOTE — Progress Notes (Signed)
  Subjective:    Patient ID: Benjamin Patel, male    DOB: [birth year]  MRN: [id number]  HPI  33 yowm quit smoking 1978 p father died due to smoking referred by Dr Cleta Alberts for eval of lung nodule.   02/21/2012 1st pulmonary eval cc intermittent bronchitis x decades not more than once a year then abruptly x 3 weeks prior to OV  > cough rattling   and sense of chest tightness and doe all better p stop   better p mucinex dm with brown mucus production but only sob with cough and no hemoptysis or cp. eval suggested RUL nodule so CT chest done suggesting bronchiectasis and LLL nodule.  He has no risk factors for aspiration and no teeth or active sinus complaints or pleuritic cp. rec We need to postpone your PET scan and your Thoracic Surgery Augmentin 875 mg twice daily x 10days mucinex dm 1200mg  every 12 hours as needed for cough congestion GERD  deit  03/10/2012 f/u ov/Admire Bunnell cc all better in terms of symptoms, no cough or sob, no longer needing cough suppression  No obvious daytime variabilty or assoc sob, cp or chest tightness, subjective wheeze overt sinus or hb symptoms. No unusual exp hx or h/o childhood pna/ asthma or premature birth to his knowledge.     Sleeping ok without nocturnal  or early am exacerbation  of respiratory  c/o's or need for noct saba. Also denies any obvious fluctuation of symptoms with weather or environmental changes or other aggravating or alleviating factors except as outlined above  ROS  The following are not active complaints unless bolded sore throat, dysphagia, dental problems, itching, sneezing,  nasal congestion or excess/ purulent secretions, ear ache,   fever, chills, sweats, unintended wt loss, pleuritic or exertional cp, hemoptysis,  orthopnea pnd or leg swelling, presyncope, palpitations, heartburn, abdominal pain, anorexia, nausea, vomiting, diarrhea  or change in bowel or urinary habits, change in stools or urine, dysuria,hematuria,  rash, arthralgias, visual  complaints, headache, numbness weakness or ataxia or problems with walking or coordination,  change in mood/affect or memory.              Objective:   Physical Exam  [name] 03/10/2012 Wt  162  Wt Readings from Last 3 Encounters:  02/21/12 160 lb 12.8 oz (72.938 kg)  02/12/12 164 lb 6.4 oz (74.571 kg)  02/09/12 165 lb 4.8 oz (74.98 kg)   HEENT:  Edentulous, nl urbinates, and orophanx. Nl external ear canals without cough reflex   NECK :  without JVD/Nodes/TM/ nl carotid upstrokes bilaterally   LUNGS: no acc muscle use, clear to A and P bilaterally without cough on insp or exp maneuvers   CV:  RRR  no s3 or murmur or increase in P2, no edema   ABD:  soft and nontender with nl excursion in the supine position. No bruits or organomegaly, bowel sounds nl  MS:  warm without deformities, calf tenderness, cyanosis or clubbing  SKIN: warm and dry without lesions    NEURO:  alert, approp, no deficits      CXR  03/10/2012 :  1. Nodular opacity in the superior segment of the left lower lobe  appears more conspicuous than the prior radiograph dated  02/08/2012       Assessment & Plan:

## 2012-03-10 NOTE — Assessment & Plan Note (Signed)
-   cxr 03/10/2012   conspicuous on lateral whereas was not present 02/08/12  Plain cxr's suggest new nodular density - in setting of recent uri symptoms much more likely to be inflammatory and now post inflammatory density  than rapidly expanding mass though haven't excluded this - PET scan not really a good way to distinguish.  Discussed in detail all the  indications, usual  risks and alternatives  relative to the benefits with patient who agrees to proceed with conservative f/u with cxr in 6 weeks

## 2012-03-18 ENCOUNTER — Ambulatory Visit (HOSPITAL_COMMUNITY): Payer: Medicare Other

## 2012-03-19 ENCOUNTER — Encounter: Payer: Self-pay | Admitting: Neurosurgery

## 2012-03-20 ENCOUNTER — Ambulatory Visit (INDEPENDENT_AMBULATORY_CARE_PROVIDER_SITE_OTHER): Payer: Medicare Other | Admitting: Neurosurgery

## 2012-03-20 ENCOUNTER — Encounter: Payer: Self-pay | Admitting: Neurosurgery

## 2012-03-20 ENCOUNTER — Other Ambulatory Visit (INDEPENDENT_AMBULATORY_CARE_PROVIDER_SITE_OTHER): Payer: Medicare Other | Admitting: *Deleted

## 2012-03-20 DIAGNOSIS — I6529 Occlusion and stenosis of unspecified carotid artery: Secondary | ICD-10-CM | POA: Diagnosis not present

## 2012-03-20 NOTE — Progress Notes (Signed)
[facility] [place] Carotid Office Note  CC: Carotid surveillance Referring Physician: Early  History of Present Illness: 79 year old male patient of Dr. Arbie Cookey followed for known carotid stenosis. The patient denies any signs or symptoms of CVA, TIA, amaurosis fugax or any neural deficit. The patient denies any new medical diagnoses or recent surgery.  Past Medical History  Diagnosis Date  . Essential hypertension, benign   . Mixed hyperlipidemia   . Asthma   . Gout   . Type II or unspecified type diabetes mellitus without mention of complication, not stated as uncontrolled   . Skin cancer   . Abnormal prostate exam   . Carotid stenosis   . Cholelithiasis   . Renal insufficiency   . Renal artery stenosis     right  . PAC (premature atrial contraction)   . PVCs (premature ventricular contractions)   . Pneumonia 02/08/2012    ROS: [x]  Positive   [ ]  Denies    General: [ ]  Weight loss, [ ]  Fever, [ ]  chills Neurologic: [ ]  Dizziness, [ ]  Blackouts, [ ]  Seizure [ ]  Stroke, [ ]  "Mini stroke", [ ]  Slurred speech, [ ]  Temporary blindness; [ ]  weakness in arms or legs, [ ]  Hoarseness Cardiac: [ ]  Chest pain/pressure, [ ]  Shortness of breath at rest [ ]  Shortness of breath with exertion, [ ]  Atrial fibrillation or irregular heartbeat Vascular: [ ]  Pain in legs with walking, [ ]  Pain in legs at rest, [ ]  Pain in legs at night,  [ ]  Non-healing ulcer, [ ]  Blood clot in vein/DVT,   Pulmonary: [ ]  Home oxygen, [ ]  Productive cough, [ ]  Coughing up blood, [ ]  Asthma,  [ ]  Wheezing Musculoskeletal:  [ ]  Arthritis, [ ]  Low back pain, [ ]  Joint pain Hematologic: [ ]  Easy Bruising, [ ]  Anemia; [ ]  Hepatitis Gastrointestinal: [ ]  Blood in stool, [ ]  Gastroesophageal Reflux/heartburn, [ ]  Trouble swallowing Urinary: [ ]  chronic Kidney disease, [ ]  on HD - [ ]  MWF or [ ]  TTHS, [ ]  Burning with urination, [ ]  Difficulty urinating Skin: [ ]  Rashes, [ ]  Wounds Psychological: [  ] Anxiety, [ ]  Depression   Social History History  Substance Use Topics  . Smoking status: Former Smoker -- 1.50 packs/day for 30 years    Types: Cigarettes    Quit date: 01/16/1976  . Smokeless tobacco: Never Used  . Alcohol Use: No    Family History Family History  Problem Relation Age of Onset  . Heart disease Mother   . Cancer Father     Allergies  Allergen Reactions  . Ace Inhibitors Cough  . Codeine Other (See Comments)    GI Upset  . Indomethacin Other (See Comments)    Elevated BP    Current Outpatient Prescriptions  Medication Sig Dispense Refill  . aspirin EC 81 MG tablet Take 81 mg by mouth every evening.      . cloNIDine (CATAPRES) 0.2 MG tablet Take 0.2 mg by mouth 2 (two) times daily.      . hydrochlorothiazide (MICROZIDE) 12.5 MG capsule Take 12.5 mg by mouth daily.      Marland Kitchen losartan (COZAAR) 50 MG tablet TAKE 1 TABLET EVERY DAY  30 tablet  2  . metFORMIN (GLUCOPHAGE) 500 MG tablet Take 500 mg by mouth 2 (two) times daily with a meal.      . metoprolol succinate (TOPROL-XL) 100 MG 24 hr tablet TAKE 1 TABLET BY MOUTH  DAILY. TAKE WITH OR IMMEDIATELY FOLLOWING A MEAL.  30 tablet  3  . niacin (NIASPAN) 500 MG CR tablet Take 500 mg by mouth at bedtime.      . pravastatin (PRAVACHOL) 20 MG tablet Take 20 mg by mouth every evening.      Marland Kitchen albuterol (PROVENTIL HFA;VENTOLIN HFA) 108 (90 BASE) MCG/ACT inhaler Use 1 puff every 4 hours as needed for cough  1 Inhaler  1  . losartan (COZAAR) 25 MG tablet Take 25 mg by mouth every evening.       No current facility-administered medications for this visit.    Physical Examination  Filed Vitals:   03/20/12 1459  BP: 152/71  Pulse: 62  Resp:     Body mass index is 24.27 kg/(m^2).  General:  WDWN in NAD Gait: Normal HEENT: WNL Eyes: Pupils equal Pulmonary: normal non-labored breathing , without Rales, rhonchi,  wheezing Cardiac: RRR, without  Murmurs, rubs or gallops; Abdomen: soft, NT, no masses Skin: no  rashes, ulcers noted  Vascular Exam Pulses: 3+ radial pulses bilaterally Carotid bruits: Carotid pulses to auscultation Extremities without ischemic changes, no Gangrene , no cellulitis; no open wounds;  Musculoskeletal: no muscle wasting or atrophy   Neurologic: A&O X 3; Appropriate Affect ; SENSATION: normal; MOTOR FUNCTION:  moving all extremities equally. Speech is fluent/normal  Non-Invasive Vascular Imaging CAROTID DUPLEX 03/20/2012  Right ICA 20 - 39 % stenosis Left ICA 20 - 39 % stenosis   ASSESSMENT/PLAN: Asymptomatic patient with unchanged carotid duplex.. The patient will followup in one year with repeat carotid duplex. The patient's questions were encouraged and answered, he is in agreement with this plan.  Lauree Chandler ANP   Clinic MD: Darrick Penna

## 2012-03-24 NOTE — Addendum Note (Signed)
Addended by: Sharee Pimple on: 03/24/2012 01:24 PM   Modules accepted: Orders

## 2012-04-03 ENCOUNTER — Other Ambulatory Visit: Payer: Self-pay | Admitting: Emergency Medicine

## 2012-04-03 ENCOUNTER — Ambulatory Visit: Payer: Medicare Other | Admitting: Vascular Surgery

## 2012-04-03 ENCOUNTER — Other Ambulatory Visit: Payer: Self-pay | Admitting: Physician Assistant

## 2012-04-21 ENCOUNTER — Ambulatory Visit (INDEPENDENT_AMBULATORY_CARE_PROVIDER_SITE_OTHER): Payer: Medicare Other | Admitting: Internal Medicine

## 2012-04-21 ENCOUNTER — Encounter: Payer: Self-pay | Admitting: Internal Medicine

## 2012-04-21 ENCOUNTER — Ambulatory Visit (INDEPENDENT_AMBULATORY_CARE_PROVIDER_SITE_OTHER)
Admission: RE | Admit: 2012-04-21 | Discharge: 2012-04-21 | Disposition: A | Discharge status: Home / Self Care | Payer: Medicare Other | Source: Ambulatory Visit | Attending: Internal Medicine | Admitting: Internal Medicine

## 2012-04-21 VITALS — BP 122/72 | HR 65 | Temp 96.5°F | Ht 68.5 in | Wt 164.0 lb

## 2012-04-21 DIAGNOSIS — R911 Solitary pulmonary nodule: Secondary | ICD-10-CM

## 2012-04-21 DIAGNOSIS — J984 Other disorders of lung: Secondary | ICD-10-CM | POA: Diagnosis not present

## 2012-04-21 NOTE — Progress Notes (Signed)
Subjective:    Patient ID: Benjamin Patel Patel, male    DOB: 1927-09-24  MRN: [id number]  HPI  65 yowm quit smoking 1978 p father died due to smoking referred by Dr Cleta Alberts for eval of lung nodule.   02/21/2012 1st pulmonary eval cc intermittent bronchitis x decades not more than once a year then abruptly x 3 weeks prior to OV  > cough rattling   and sense of chest tightness and doe all better p stop   better p mucinex dm with brown mucus production but only sob with cough and no hemoptysis or cp. eval suggested RUL nodule so CT chest done suggesting bronchiectasis and LLL nodule.  He has no risk factors for aspiration and no teeth or active sinus complaints or pleuritic cp. rec We need to postpone your PET scan and your Thoracic Surgery Augmentin 875 mg twice daily x 10days mucinex dm 1200mg  every 12 hours as needed for cough congestion GERD  deit  03/10/2012 f/u ov/Shyniece Scripter cc all better in terms of symptoms, no cough or sob, no longer needing cough suppression rec F/u cxr, no further rx   04/21/2012 f/u ov/Shravan Salahuddin for f/u cx Chief Complaint  Patient presents with  . Follow-up    Pt states doing well today, cough has resolved completely   no cough no sob   No obvious daytime variabilty or assoc sob, cp or chest tightness, subjective wheeze overt sinus or hb symptoms. No unusual exp hx or h/o childhood pna/ asthma or premature birth to his knowledge.     Sleeping ok without nocturnal  or early am exacerbation  of respiratory  c/o's or need for noct saba. Also denies any obvious fluctuation of symptoms with weather or environmental changes or other aggravating or alleviating factors except as outlined above  ROS  The following are not active complaints unless bolded sore throat, dysphagia, dental problems, itching, sneezing,  nasal congestion or excess/ purulent secretions, ear ache,   fever, chills, sweats, unintended wt loss, pleuritic or exertional cp, hemoptysis,  orthopnea pnd or leg swelling,  presyncope, palpitations, heartburn, abdominal pain, anorexia, nausea, vomiting, diarrhea  or change in bowel or urinary habits, change in stools or urine, dysuria,hematuria,  rash, arthralgias, visual complaints, headache, numbness weakness or ataxia or problems with walking or coordination,  change in mood/affect or memory.              Objective:   Physical Exam  [name] 03/10/2012 Wt  162  Vs  04/21/2012 164 Wt Readings from Last 3 Encounters:  02/21/12 160 lb 12.8 oz (72.938 kg)  02/12/12 164 lb 6.4 oz (74.571 kg)  02/09/12 165 lb 4.8 oz (74.98 kg)   HEENT:  Edentulous, nl urbinates, and orophanx. Nl external ear canals without cough reflex   NECK :  without JVD/Nodes/TM/ nl carotid upstrokes bilaterally   LUNGS: no acc muscle use, clear to A and P bilaterally without cough on insp or exp maneuvers   CV:  RRR  no s3 or murmur or increase in P2, no edema   ABD:  soft and nontender with nl excursion in the supine position. No bruits or organomegaly, bowel sounds nl  MS:  warm without deformities, calf tenderness, cyanosis or clubbing  SKIN: warm and dry without lesions    NEURO:  alert, approp, no deficits      CXR  03/10/2012 :  1. Nodular opacity in the superior segment of the left lower lobe  appears more  conspicuous than the prior radiograph dated  02/08/2012  cxr 04/21/12 Persistent density in the superior segment of the left lower lobe,  suspicious for malignancy.       Assessment & Plan:

## 2012-04-21 NOTE — Patient Instructions (Addendum)
Please remember to go to the  x-ray department downstairs for your tests - we will call you with the results when they are available.     

## 2012-04-22 ENCOUNTER — Other Ambulatory Visit: Payer: Self-pay | Admitting: Internal Medicine

## 2012-04-22 ENCOUNTER — Telehealth: Payer: Self-pay

## 2012-04-22 DIAGNOSIS — R911 Solitary pulmonary nodule: Secondary | ICD-10-CM

## 2012-04-22 NOTE — Progress Notes (Signed)
Quick Note:  Spoke with pt and notified of results per Dr. Wert. Pt verbalized understanding and denied any questions.  ______ 

## 2012-04-22 NOTE — Assessment & Plan Note (Signed)
-   Chest CT 02/08/12  Bilateral upper lobe centrilobular emphysematous  changes are noted. Consistent with history there is a spiculated  nodule in the superior segment of the left lower lobe posteriorly  measures two by 1.7 cm. There is adjacent focal bronchiectasis or  a small emphysematous bulla - cxr 03/10/2012   conspicuous on lateral whereas was not present 02/08/12> no def change 04/21/12 > rec limited ct  This most likely was inflammatory so PET scan not a great option here  Discussed in detail all the  indications, usual  risks and alternatives  relative to the benefits with patient who agrees to proceed with limited CT and if shows growth consider L superior segmentectomy with PET first only if surgery requires it as he appears to be a very good surgical candidate and a pet scan false pos for ca high in pt with possible infectious cause of nodules

## 2012-04-22 NOTE — Telephone Encounter (Signed)
Pt has appt set up for 04/23/12.

## 2012-04-22 NOTE — Telephone Encounter (Signed)
I will have Benjamin Patel call him and get him an appointment to see me tomorrow

## 2012-04-22 NOTE — Telephone Encounter (Signed)
When do you want to see him, he is asking to be worked in.

## 2012-04-22 NOTE — Telephone Encounter (Signed)
Dr. Karsten Fells wants to know if he needs to schedule a follow up appointment with you. Please call him to discuss [phone]

## 2012-04-23 ENCOUNTER — Ambulatory Visit (INDEPENDENT_AMBULATORY_CARE_PROVIDER_SITE_OTHER): Payer: Medicare Other | Admitting: Emergency Medicine

## 2012-04-23 ENCOUNTER — Encounter: Payer: Self-pay | Admitting: Emergency Medicine

## 2012-04-23 VITALS — BP 120/58 | HR 60 | Temp 97.4°F | Resp 16 | Ht 67.5 in | Wt 160.2 lb

## 2012-04-23 DIAGNOSIS — E782 Mixed hyperlipidemia: Secondary | ICD-10-CM | POA: Diagnosis not present

## 2012-04-23 DIAGNOSIS — I10 Essential (primary) hypertension: Secondary | ICD-10-CM | POA: Diagnosis not present

## 2012-04-23 DIAGNOSIS — E119 Type 2 diabetes mellitus without complications: Secondary | ICD-10-CM | POA: Diagnosis not present

## 2012-04-23 LAB — POCT GLYCOSYLATED HEMOGLOBIN (HGB A1C): Hemoglobin A1C: 5.7

## 2012-04-23 NOTE — Progress Notes (Signed)
  Subjective:    Patient ID: Benjamin Patel, male    DOB: [birth year], 79 y.o.   MRN: [id number]  HPI patient in for recheck of his diabetes. He is being followed closely by Dr. Sherene Sires for a nodule in his lung. He is scheduled to have a PET scan done. His diabetes has been under fair control since he recovered from his respiratory illness.    Review of Systems     Objective:   Physical Exam patient is alert cooperative no distress. His neck is supple. His chest is clear to both auscultation and percussion. Cardiac exam is unremarkable. Examination of his feet reveals normal sensation pulses are symmetrical  Results for orders placed in visit on 04/23/12  POCT GLYCOSYLATED HEMOGLOBIN (HGB A1C)      Result Value Range   Hemoglobin A1C 5.7    GLUCOSE, POCT (MANUAL RESULT ENTRY)      Result Value Range   POC Glucose 133 (*) 70 - 99 mg/dl        Assessment & Plan:  We'll check his glucose and A1c today. Dr. Sherene Sires  to followup on his pulmonary nodule. He going to take Aleve at night for his toe pain

## 2012-04-29 ENCOUNTER — Encounter: Payer: Self-pay | Admitting: Internal Medicine

## 2012-04-29 ENCOUNTER — Ambulatory Visit (INDEPENDENT_AMBULATORY_CARE_PROVIDER_SITE_OTHER)
Admission: RE | Admit: 2012-04-29 | Discharge: 2012-04-29 | Disposition: A | Discharge status: Home / Self Care | Payer: Medicare Other | Source: Ambulatory Visit | Attending: Internal Medicine | Admitting: Internal Medicine

## 2012-04-29 DIAGNOSIS — R911 Solitary pulmonary nodule: Secondary | ICD-10-CM | POA: Diagnosis not present

## 2012-05-02 ENCOUNTER — Other Ambulatory Visit: Payer: Self-pay | Admitting: Physician Assistant

## 2012-05-05 ENCOUNTER — Other Ambulatory Visit: Payer: Self-pay | Admitting: Internal Medicine

## 2012-05-05 DIAGNOSIS — R911 Solitary pulmonary nodule: Secondary | ICD-10-CM

## 2012-05-05 NOTE — Progress Notes (Signed)
Quick Note:  Order was sent ______

## 2012-05-08 ENCOUNTER — Encounter (HOSPITAL_COMMUNITY)
Admission: RE | Admit: 2012-05-08 | Discharge: 2012-05-08 | Disposition: A | Discharge status: Home / Self Care | Payer: Medicare Other | Source: Ambulatory Visit | Attending: Internal Medicine | Admitting: Internal Medicine

## 2012-05-08 ENCOUNTER — Encounter (HOSPITAL_COMMUNITY): Payer: Self-pay

## 2012-05-08 DIAGNOSIS — K5730 Diverticulosis of large intestine without perforation or abscess without bleeding: Secondary | ICD-10-CM | POA: Diagnosis not present

## 2012-05-08 DIAGNOSIS — K8020 Calculus of gallbladder without cholecystitis without obstruction: Secondary | ICD-10-CM | POA: Insufficient documentation

## 2012-05-08 DIAGNOSIS — N281 Cyst of kidney, acquired: Secondary | ICD-10-CM | POA: Insufficient documentation

## 2012-05-08 DIAGNOSIS — I2584 Coronary atherosclerosis due to calcified coronary lesion: Secondary | ICD-10-CM | POA: Insufficient documentation

## 2012-05-08 DIAGNOSIS — N400 Enlarged prostate without lower urinary tract symptoms: Secondary | ICD-10-CM | POA: Diagnosis not present

## 2012-05-08 DIAGNOSIS — R911 Solitary pulmonary nodule: Secondary | ICD-10-CM | POA: Insufficient documentation

## 2012-05-08 DIAGNOSIS — I2510 Atherosclerotic heart disease of native coronary artery without angina pectoris: Secondary | ICD-10-CM | POA: Insufficient documentation

## 2012-05-08 LAB — GLUCOSE, CAPILLARY: Glucose-Capillary: 130 mg/dL — ABNORMAL HIGH (ref 70–99)

## 2012-05-08 MED ORDER — FLUDEOXYGLUCOSE F - 18 (FDG) INJECTION
18.7000 | Freq: Once | INTRAVENOUS | Status: AC | PRN
  Administered 2012-05-08: 18.7 via INTRAVENOUS

## 2012-05-09 ENCOUNTER — Other Ambulatory Visit: Payer: Self-pay | Admitting: Internal Medicine

## 2012-05-09 ENCOUNTER — Encounter: Payer: Self-pay | Admitting: Internal Medicine

## 2012-05-09 DIAGNOSIS — R918 Other nonspecific abnormal finding of lung field: Secondary | ICD-10-CM

## 2012-05-09 NOTE — Progress Notes (Signed)
Quick Note:  Orders sent to [facility] for PFT asap and CVTS referral ______

## 2012-05-12 ENCOUNTER — Ambulatory Visit (HOSPITAL_COMMUNITY)
Admission: RE | Admit: 2012-05-12 | Discharge: 2012-05-12 | Disposition: A | Discharge status: Home / Self Care | Payer: Medicare Other | Source: Ambulatory Visit | Attending: Internal Medicine | Admitting: Internal Medicine

## 2012-05-12 DIAGNOSIS — J988 Other specified respiratory disorders: Secondary | ICD-10-CM | POA: Insufficient documentation

## 2012-05-12 DIAGNOSIS — R918 Other nonspecific abnormal finding of lung field: Secondary | ICD-10-CM

## 2012-05-12 DIAGNOSIS — R222 Localized swelling, mass and lump, trunk: Secondary | ICD-10-CM | POA: Diagnosis not present

## 2012-05-12 LAB — PULMONARY FUNCTION TEST

## 2012-05-12 MED ORDER — ALBUTEROL SULFATE (5 MG/ML) 0.5% IN NEBU
2.5000 mg | INHALATION_SOLUTION | Freq: Once | RESPIRATORY_TRACT | Status: AC
  Administered 2012-05-12: 2.5 mg via RESPIRATORY_TRACT

## 2012-05-15 ENCOUNTER — Encounter: Payer: Self-pay | Admitting: *Deleted

## 2012-05-15 ENCOUNTER — Institutional Professional Consult (permissible substitution) (INDEPENDENT_AMBULATORY_CARE_PROVIDER_SITE_OTHER): Payer: Medicare Other | Admitting: Thoracic Surgery (Cardiothoracic Vascular Surgery)

## 2012-05-15 ENCOUNTER — Encounter: Payer: Self-pay | Admitting: Thoracic Surgery (Cardiothoracic Vascular Surgery)

## 2012-05-15 VITALS — BP 147/64 | HR 65 | Temp 98.0°F | Resp 20 | Ht 68.0 in | Wt 165.7 lb

## 2012-05-15 DIAGNOSIS — J449 Chronic obstructive pulmonary disease, unspecified: Secondary | ICD-10-CM | POA: Diagnosis not present

## 2012-05-15 DIAGNOSIS — R222 Localized swelling, mass and lump, trunk: Secondary | ICD-10-CM | POA: Diagnosis not present

## 2012-05-15 DIAGNOSIS — R918 Other nonspecific abnormal finding of lung field: Secondary | ICD-10-CM

## 2012-05-15 DIAGNOSIS — I2510 Atherosclerotic heart disease of native coronary artery without angina pectoris: Secondary | ICD-10-CM | POA: Diagnosis not present

## 2012-05-15 NOTE — Progress Notes (Signed)
PCP is DAUB, STEVE A, MD Referring Provider is Wert, Michael B, MD    Chief Complaint   Patient presents with   .  Lung Mass       surgical eval on lung mass, Chest CT 04/25/12, PET Scan 05/05/12        HPI: 79 yo man presents with a cc/o a lung mass.   [name] is an 79 yo WM with a history of tobacco abuse(quit in 1978) and newly diagnosed COPD. He was in his usual state of good health until January of this year. He developed a persistent cough. It was nonproductive initially but towards the end it was productive with yellow sputum. He had a CXR which showed a possible left lung mass. A CT of the chest was done which confirmed a mass in the LLL. He was treated with antibiotics and the nodule initially got a little smaller. He recently had a follow up CT which showed the mass had increased in size. A PET was done and the lesion was hypermetabolic. There was no evidence of regional or distant metastases.   He says that he is very active. He does not have a regular exercise program, but does something active everyday. He has 2 flights of stairs in the home and can walk up both without getting SOB. He denies any CP or SOB, wheezing, hemoptysis, wt loss, change in appetite.   ZUBROD score = 0    Past Medical History   Diagnosis  Date   .  Essential hypertension, benign     .  Mixed hyperlipidemia     .  Asthma     .  Gout     .  Type II or unspecified type diabetes mellitus without mention of complication, not stated as uncontrolled     .  Skin cancer     .  Abnormal prostate exam     .  Carotid stenosis     .  Cholelithiasis     .  Renal insufficiency     .  Renal artery stenosis         right   .  PAC (premature atrial contraction)     .  PVCs (premature ventricular contractions)     .  Pneumonia  02/08/2012         Past Surgical History   Procedure  Laterality  Date   .  No past surgeries             Family History   Problem  Relation  Age of Onset   .  Heart  disease  Mother     .  Cancer  Father          Social History History   Substance Use Topics   .  Smoking status:  Former Smoker -- 1.50 packs/day for 30 years       Types:  Cigarettes       Quit date:  01/16/1976   .  Smokeless tobacco:  Never Used   .  Alcohol Use:  No         Current Outpatient Prescriptions   Medication  Sig  Dispense  Refill   .  albuterol (PROVENTIL HFA;VENTOLIN HFA) 108 (90 BASE) MCG/ACT inhaler  Use 1 puff every 4 hours as needed for cough   1 Inhaler   1   .  aspirin EC 81 MG tablet  Take 81 mg by mouth every evening.         .    cloNIDine (CATAPRES) 0.2 MG tablet  Take 0.2 mg by mouth 2 (two) times daily.         .  cloNIDine (CATAPRES) 0.2 MG tablet  TAKE 1 TABLET BY MOUTH TWICE A DAY   60 tablet   5   .  hydrochlorothiazide (MICROZIDE) 12.5 MG capsule  Take 12.5 mg by mouth daily.         .  losartan (COZAAR) 50 MG tablet  TAKE 1 TABLET EVERY DAY   30 tablet   2   .  metFORMIN (GLUCOPHAGE) 500 MG tablet  Take 500 mg by mouth 2 (two) times daily with a meal.         .  metoprolol succinate (TOPROL-XL) 100 MG 24 hr tablet  TAKE 1 TABLET BY MOUTH DAILY. TAKE WITH OR IMMEDIATELY FOLLOWING A MEAL.   30 tablet   3   .  niacin (NIASPAN) 500 MG CR tablet  Take 500 mg by mouth at bedtime.         .  pravastatin (PRAVACHOL) 20 MG tablet  Take 20 mg by mouth every evening.         .  pravastatin (PRAVACHOL) 20 MG tablet  TAKE 1 TABLET BY MOUTH EVERY DAY   30 tablet   5       No current facility-administered medications for this visit.         Allergies   Allergen  Reactions   .  Ace Inhibitors  Cough   .  Codeine  Other (See Comments)       GI Upset   .  Indomethacin  Other (See Comments)       Elevated BP        Review of Systems  Constitutional: Negative for fever, chills, activity change and unexpected weight change.  Eyes: Negative.   Respiratory: Positive for cough. Negative for chest tightness, shortness of breath and wheezing.    Cardiovascular: Negative for chest pain, palpitations and leg swelling.  Gastrointestinal: Negative.   Endocrine: Negative.   Musculoskeletal: Positive for joint swelling (history of gout).  Neurological: Negative.   Hematological: Negative.       BP 147/64  Pulse 65  Temp(Src) 98 F (36.7 C)  Resp 20  Ht 5' 8" (1.727 m)  Wt 165 lb 11.2 oz (75.161 kg)  BMI 25.2 kg/m2  SpO2 % Physical Exam  Vitals reviewed. Constitutional: He is oriented to person, place, and time. He appears well-developed and well-nourished. No distress.  HENT:   Head: Normocephalic and atraumatic.  Eyes: EOM are normal. Pupils are equal, round, and reactive to light.  Neck: Neck supple. No thyromegaly present.  + bilateral bruits  Cardiovascular: Normal rate, regular rhythm, normal heart sounds and intact distal pulses.  Exam reveals no gallop and no friction rub.    No murmur heard. Pulmonary/Chest: Effort normal and breath sounds normal. He has no wheezes. He has no rales.  Abdominal: Soft. There is no tenderness.  Musculoskeletal: Normal range of motion. He exhibits no edema.  Lymphadenopathy:    He has no cervical adenopathy.  Neurological: He is alert and oriented to person, place, and time. No cranial nerve deficit.  Skin: Skin is warm and dry.  Psychiatric: He has a normal mood and affect.        Diagnostic Tests: CT CHEST WITHOUT CONTRAST 04/29/12 Technique: Multidetector CT imaging of the chest was performed   following the standard protocol without IV contrast.   Comparison: Plain films   including 04/21/2012 and CT of 02/08/2012.   Findings: Lungs/pleura: Incompletely imaged probable secretions in   the nondependent trachea on image 1/series 3. Moderate   centrilobular emphysema.   Minimal subpleural scarring in the right lower lobe on image   38/series 3.   Spiculated nodule within the superior segment left lower lobe   measures 2.0 x 1.6 cm on image 34/series 3. 1.5 x 1.4 cm at the    same level on the prior exam. On sagittal image 117, this measures   2.0 x 1.7 cm today versus similar on the prior. No pleural fluid.   Heart/Mediastinum: Aortic and coronary artery atherosclerosis.   Mild cardiomegaly, without pericardial effusion. No mediastinal or   definite hilar adenopathy, given limitations of unenhanced CT.   Upper abdomen: Cholelithiasis. Bilateral renal collecting system   calculi. Probable right renal atrophy. Minimal right adrenal   nodularity which is unchanged.   Bones/Musculoskeletal: Remote left posterior tenth rib trauma.   IMPRESSION:   1. Similar to minimal enlargement of a superior segment left lower   lobe lung nodule, highly suspicious for primary bronchogenic   carcinoma.   2. No evidence of metastatic disease.   3. Cholelithiasis and nephrolithiasis.       NUCLEAR MEDICINE PET SKULL BASE TO THIGH   Fasting Blood Glucose: 130   Technique: 18.7 mCi F-18 FDG was injected intravenously. CT data   was obtained and used for attenuation correction and anatomic   localization only. (This was not acquired as a diagnostic CT   examination.) Additional exam technical data entered on   technologist worksheet.   Comparison: CT 01/23/2022 2014, 04/29/2012   Findings:   Neck: No hypermetabolic nodes in the neck.   Chest: Within the left lower lobe, there is a round 21 x 20 mm   nodule with apparent increase in size from 17 x 15 mm on CT of   02/08/2012. This lesion is intensely hypermetabolic with SUV max =   14.6. There are no additional hypermetabolic pulmonary nodules.   There are no hypermetabolic mediastinal lymph nodes. Small   mediastinal lymph nodes are not pathologic by size criteria.   Coronary calcifications are noted. No pleural fluid.   Abdomen / Pelvis:No abnormal hypermetabolic activity within the   solid organs. No evidence of abdominal or pelvic hypermetabolic   nodes. Multiple gallstones noted. There is a simple cyst within   the  left kidney. Multiple diverticula of the sigmoid colon.   Prostate hypertrophy.   Skeleton:No focal hypermetabolic activity to suggest skeletal   metastasis.   IMPRESSION:   1. Hypermetabolic left lower lobe pulmonary nodule is most   concerning for bronchogenic carcinoma. If indeed the lesion is   carcinoma, FDG PET/CT staging T1b N0 M0   2. Cholelithiasis and diverticulosis.   PFTs   FEV1 0.86(34%), 0.91 after bronchodilators FEV1/FVC 48% DLCO 35%     Impression: 79 yo male with a history of smoking with a 2 cm mass in the superior segment of the left lower lobe. The lesion has grown over a relatively short period of time and is hypermetabolic on PET. It is highly suspicious for a primary lung cancer and has to be considered that until proven otherwise.   We discussed the differential diagnosis and the ways to go about making a diagnosis, including bronch, needle biopsy and surgical biopsy. We discussed the treatment options for early stage lung cancer- surgery v radiation.   He has marginal lung function with an FEV

## 2012-05-16 DIAGNOSIS — R222 Localized swelling, mass and lump, trunk: Secondary | ICD-10-CM | POA: Diagnosis not present

## 2012-05-16 DIAGNOSIS — I2510 Atherosclerotic heart disease of native coronary artery without angina pectoris: Secondary | ICD-10-CM | POA: Diagnosis not present

## 2012-05-16 DIAGNOSIS — I701 Atherosclerosis of renal artery: Secondary | ICD-10-CM | POA: Diagnosis not present

## 2012-05-16 DIAGNOSIS — E782 Mixed hyperlipidemia: Secondary | ICD-10-CM | POA: Diagnosis not present

## 2012-05-16 DIAGNOSIS — I10 Essential (primary) hypertension: Secondary | ICD-10-CM | POA: Diagnosis not present

## 2012-05-16 DIAGNOSIS — E119 Type 2 diabetes mellitus without complications: Secondary | ICD-10-CM | POA: Diagnosis not present

## 2012-05-16 DIAGNOSIS — Z01810 Encounter for preprocedural cardiovascular examination: Secondary | ICD-10-CM | POA: Diagnosis not present

## 2012-05-23 DIAGNOSIS — Z01810 Encounter for preprocedural cardiovascular examination: Secondary | ICD-10-CM | POA: Diagnosis not present

## 2012-05-23 DIAGNOSIS — E782 Mixed hyperlipidemia: Secondary | ICD-10-CM | POA: Diagnosis not present

## 2012-05-23 DIAGNOSIS — R222 Localized swelling, mass and lump, trunk: Secondary | ICD-10-CM | POA: Diagnosis not present

## 2012-05-23 DIAGNOSIS — I701 Atherosclerosis of renal artery: Secondary | ICD-10-CM | POA: Diagnosis not present

## 2012-05-23 DIAGNOSIS — E119 Type 2 diabetes mellitus without complications: Secondary | ICD-10-CM | POA: Diagnosis not present

## 2012-05-23 DIAGNOSIS — I2510 Atherosclerotic heart disease of native coronary artery without angina pectoris: Secondary | ICD-10-CM | POA: Diagnosis not present

## 2012-05-23 DIAGNOSIS — I10 Essential (primary) hypertension: Secondary | ICD-10-CM | POA: Diagnosis not present

## 2012-05-28 ENCOUNTER — Telehealth: Payer: Self-pay

## 2012-05-28 ENCOUNTER — Other Ambulatory Visit: Payer: Self-pay | Admitting: Physician Assistant

## 2012-05-28 NOTE — Telephone Encounter (Signed)
Received request for RFs of HCTZ 25 mg QD. Question which is correct strength, 12.5 or 25 mg. Our records at [facility] list HCTZ 25 mg through 02/08/12 [facility] and then changes to 12.5 mg on 02/12/12 [facility], but I don't see notes that this was intentionally changed. I called pharmacy and they stated that pt has been taking 25 mg since at least 02/2011 written by Dr Cleta Alberts and has not p/up any 12.5 mg. Dr Cleta Alberts, I just wanted to check w/you to make sure you want him on the 25 mg because at last [facility] the 12.5 mg was reported on med list.

## 2012-05-29 ENCOUNTER — Other Ambulatory Visit: Payer: Self-pay | Admitting: Physician Assistant

## 2012-05-29 NOTE — Telephone Encounter (Signed)
The dosage is 25 mg.

## 2012-05-30 NOTE — Telephone Encounter (Signed)
Rx sent in

## 2012-06-11 ENCOUNTER — Other Ambulatory Visit: Payer: Self-pay | Admitting: *Deleted

## 2012-06-11 ENCOUNTER — Encounter (HOSPITAL_COMMUNITY): Payer: Self-pay | Admitting: Pharmacy Technician

## 2012-06-11 DIAGNOSIS — R918 Other nonspecific abnormal finding of lung field: Secondary | ICD-10-CM

## 2012-06-11 NOTE — Pre-Procedure Instructions (Signed)
Benjamin Patel  06/11/2012   Your procedure is scheduled on:  Mon, June 2 @ 7:30 AM  Report to [facility] at 5:30 AM.  Call this number if you have problems the morning of surgery: [phone]   Remember:   Do not eat food or drink liquids after midnight.   Take these medicines the morning of surgery with A SIP OF WATER: Albuterol<Bring Your Inhaler With You>,Clonidine(Catapres),and Metoprolol(Toprol)               No Goody's,BC's,Aleve,Ibuprofen,Aspirin,or any Herbal Medications   Do not wear jewelry  Do not wear lotions, powders, or colognes. You may wear deodorant.  Men may shave face and neck.  Do not bring valuables to the hospital.  Contacts, dentures or bridgework may not be worn into surgery.  Leave suitcase in the car. After surgery it may be brought to your room.  For patients admitted to the hospital, checkout time is 11:00 AM the day of  discharge.   Patients discharged the day of surgery will not be allowed to drive  home.    Special Instructions: Shower using CHG 2 nights before surgery and the night before surgery.  If you shower the day of surgery use CHG.  Use special wash - you have one bottle of CHG for all showers.  You should use approximately 1/3 of the bottle for each shower.   Please read over the following fact sheets that you were given: Pain Booklet, Coughing and Deep Breathing, Blood Transfusion Information, MRSA Information and Surgical Site Infection Prevention

## 2012-06-12 ENCOUNTER — Encounter (HOSPITAL_COMMUNITY): Payer: Self-pay

## 2012-06-12 ENCOUNTER — Encounter (HOSPITAL_COMMUNITY)
Admission: RE | Admit: 2012-06-12 | Discharge: 2012-06-12 | Disposition: A | Discharge status: Home / Self Care | Payer: Medicare Other | Source: Ambulatory Visit | Attending: Thoracic Surgery (Cardiothoracic Vascular Surgery) | Admitting: Thoracic Surgery (Cardiothoracic Vascular Surgery)

## 2012-06-12 VITALS — BP 138/69 | HR 86 | Temp 97.9°F | Resp 20 | Ht 68.0 in | Wt 165.3 lb

## 2012-06-12 DIAGNOSIS — N289 Disorder of kidney and ureter, unspecified: Secondary | ICD-10-CM | POA: Diagnosis not present

## 2012-06-12 DIAGNOSIS — J9819 Other pulmonary collapse: Secondary | ICD-10-CM | POA: Diagnosis not present

## 2012-06-12 DIAGNOSIS — Z888 Allergy status to other drugs, medicaments and biological substances status: Secondary | ICD-10-CM | POA: Diagnosis not present

## 2012-06-12 DIAGNOSIS — Z01811 Encounter for preprocedural respiratory examination: Secondary | ICD-10-CM | POA: Diagnosis not present

## 2012-06-12 DIAGNOSIS — M109 Gout, unspecified: Secondary | ICD-10-CM | POA: Diagnosis present

## 2012-06-12 DIAGNOSIS — K8020 Calculus of gallbladder without cholecystitis without obstruction: Secondary | ICD-10-CM | POA: Diagnosis present

## 2012-06-12 DIAGNOSIS — Z452 Encounter for adjustment and management of vascular access device: Secondary | ICD-10-CM | POA: Diagnosis not present

## 2012-06-12 DIAGNOSIS — J449 Chronic obstructive pulmonary disease, unspecified: Secondary | ICD-10-CM | POA: Diagnosis not present

## 2012-06-12 DIAGNOSIS — Z7982 Long term (current) use of aspirin: Secondary | ICD-10-CM | POA: Diagnosis not present

## 2012-06-12 DIAGNOSIS — Z79899 Other long term (current) drug therapy: Secondary | ICD-10-CM | POA: Diagnosis not present

## 2012-06-12 DIAGNOSIS — I701 Atherosclerosis of renal artery: Secondary | ICD-10-CM | POA: Diagnosis present

## 2012-06-12 DIAGNOSIS — I519 Heart disease, unspecified: Secondary | ICD-10-CM | POA: Diagnosis not present

## 2012-06-12 DIAGNOSIS — D62 Acute posthemorrhagic anemia: Secondary | ICD-10-CM | POA: Diagnosis not present

## 2012-06-12 DIAGNOSIS — Z01812 Encounter for preprocedural laboratory examination: Secondary | ICD-10-CM | POA: Diagnosis not present

## 2012-06-12 DIAGNOSIS — R918 Other nonspecific abnormal finding of lung field: Secondary | ICD-10-CM

## 2012-06-12 DIAGNOSIS — I70209 Unspecified atherosclerosis of native arteries of extremities, unspecified extremity: Secondary | ICD-10-CM | POA: Diagnosis present

## 2012-06-12 DIAGNOSIS — I129 Hypertensive chronic kidney disease with stage 1 through stage 4 chronic kidney disease, or unspecified chronic kidney disease: Secondary | ICD-10-CM | POA: Diagnosis present

## 2012-06-12 DIAGNOSIS — C3490 Malignant neoplasm of unspecified part of unspecified bronchus or lung: Secondary | ICD-10-CM | POA: Diagnosis not present

## 2012-06-12 DIAGNOSIS — E1159 Type 2 diabetes mellitus with other circulatory complications: Secondary | ICD-10-CM | POA: Diagnosis present

## 2012-06-12 DIAGNOSIS — Z86010 Personal history of colonic polyps: Secondary | ICD-10-CM | POA: Diagnosis not present

## 2012-06-12 DIAGNOSIS — D381 Neoplasm of uncertain behavior of trachea, bronchus and lung: Secondary | ICD-10-CM | POA: Diagnosis not present

## 2012-06-12 DIAGNOSIS — E1129 Type 2 diabetes mellitus with other diabetic kidney complication: Secondary | ICD-10-CM | POA: Diagnosis not present

## 2012-06-12 DIAGNOSIS — N182 Chronic kidney disease, stage 2 (mild): Secondary | ICD-10-CM | POA: Diagnosis present

## 2012-06-12 DIAGNOSIS — C3430 Malignant neoplasm of lower lobe, unspecified bronchus or lung: Secondary | ICD-10-CM | POA: Diagnosis not present

## 2012-06-12 DIAGNOSIS — R911 Solitary pulmonary nodule: Secondary | ICD-10-CM | POA: Diagnosis not present

## 2012-06-12 DIAGNOSIS — J9383 Other pneumothorax: Secondary | ICD-10-CM | POA: Diagnosis not present

## 2012-06-12 DIAGNOSIS — E782 Mixed hyperlipidemia: Secondary | ICD-10-CM | POA: Diagnosis present

## 2012-06-12 DIAGNOSIS — I959 Hypotension, unspecified: Secondary | ICD-10-CM | POA: Diagnosis not present

## 2012-06-12 DIAGNOSIS — I4891 Unspecified atrial fibrillation: Secondary | ICD-10-CM | POA: Diagnosis not present

## 2012-06-12 DIAGNOSIS — J95812 Postprocedural air leak: Secondary | ICD-10-CM | POA: Diagnosis not present

## 2012-06-12 DIAGNOSIS — J438 Other emphysema: Secondary | ICD-10-CM | POA: Diagnosis not present

## 2012-06-12 DIAGNOSIS — I6529 Occlusion and stenosis of unspecified carotid artery: Secondary | ICD-10-CM | POA: Diagnosis present

## 2012-06-12 DIAGNOSIS — R222 Localized swelling, mass and lump, trunk: Secondary | ICD-10-CM | POA: Diagnosis not present

## 2012-06-12 DIAGNOSIS — Z87891 Personal history of nicotine dependence: Secondary | ICD-10-CM | POA: Diagnosis not present

## 2012-06-12 DIAGNOSIS — I2510 Atherosclerotic heart disease of native coronary artery without angina pectoris: Secondary | ICD-10-CM | POA: Diagnosis present

## 2012-06-12 HISTORY — DX: Personal history of colonic polyps: Z86.010

## 2012-06-12 HISTORY — DX: Personal history of colon polyps, unspecified: Z86.0100

## 2012-06-12 HISTORY — DX: Personal history of other diseases of the respiratory system: Z87.09

## 2012-06-12 HISTORY — DX: Chronic obstructive pulmonary disease, unspecified: J44.9

## 2012-06-12 HISTORY — DX: Personal history of other infectious and parasitic diseases: Z86.19

## 2012-06-12 HISTORY — DX: Benign prostatic hyperplasia without lower urinary tract symptoms: N40.0

## 2012-06-12 LAB — COMPREHENSIVE METABOLIC PANEL
ALT: 13 U/L (ref 0–53)
Albumin: 3.9 g/dL (ref 3.5–5.2)
Alkaline Phosphatase: 64 U/L (ref 39–117)
Potassium: 4.2 mEq/L (ref 3.5–5.1)
Sodium: 136 mEq/L (ref 135–145)
Total Protein: 7.3 g/dL (ref 6.0–8.3)

## 2012-06-12 LAB — URINALYSIS, ROUTINE W REFLEX MICROSCOPIC
Bilirubin Urine: NEGATIVE
Nitrite: NEGATIVE
Specific Gravity, Urine: 1.018 (ref 1.005–1.030)
Urobilinogen, UA: 0.2 mg/dL (ref 0.0–1.0)

## 2012-06-12 LAB — CBC
MCHC: 35.2 g/dL (ref 30.0–36.0)
Platelets: 200 10*3/uL (ref 150–400)
RDW: 12.5 % (ref 11.5–15.5)

## 2012-06-12 LAB — SURGICAL PCR SCREEN: Staphylococcus aureus: NEGATIVE

## 2012-06-12 LAB — BLOOD GAS, ARTERIAL
Acid-Base Excess: 3.6 mmol/L — ABNORMAL HIGH (ref 0.0–2.0)
Drawn by: 344381
O2 Saturation: 97.3 %
pO2, Arterial: 89.9 mmHg (ref 80.0–100.0)

## 2012-06-12 LAB — APTT: aPTT: 36 seconds (ref 24–37)

## 2012-06-12 LAB — TYPE AND SCREEN: Antibody Screen: NEGATIVE

## 2012-06-12 LAB — PROTIME-INR: Prothrombin Time: 13.1 seconds (ref 11.6–15.2)

## 2012-06-12 NOTE — Progress Notes (Signed)
Dr.Skains is cardiologist with [facility] to request last report  Stress test done-to request report as well as last echo report  Denies ever having a heart cath  Dr.Steven Daub with [facility] is Medical MD

## 2012-06-15 MED ORDER — DEXTROSE 5 % IV SOLN
1.5000 g | INTRAVENOUS | Status: AC
  Administered 2012-06-16: 1.5 g via INTRAVENOUS
  Filled 2012-06-15: qty 1.5

## 2012-06-16 ENCOUNTER — Ambulatory Visit (HOSPITAL_COMMUNITY): Payer: Medicare Other | Admitting: *Deleted

## 2012-06-16 ENCOUNTER — Inpatient Hospital Stay (HOSPITAL_COMMUNITY)
Admission: RE | Admit: 2012-06-16 | Discharge: 2012-06-23 | DRG: 164 | Disposition: A | Discharge status: Home / Self Care | Payer: Medicare Other | Source: Ambulatory Visit | Attending: Thoracic Surgery (Cardiothoracic Vascular Surgery) | Admitting: Thoracic Surgery (Cardiothoracic Vascular Surgery)

## 2012-06-16 ENCOUNTER — Telehealth: Payer: Self-pay | Admitting: Internal Medicine

## 2012-06-16 ENCOUNTER — Encounter (HOSPITAL_COMMUNITY): Payer: Self-pay | Admitting: *Deleted

## 2012-06-16 ENCOUNTER — Inpatient Hospital Stay (HOSPITAL_COMMUNITY): Payer: Medicare Other

## 2012-06-16 ENCOUNTER — Encounter (HOSPITAL_COMMUNITY)
Admission: RE | Disposition: A | Discharge status: Home / Self Care | Payer: Self-pay | Source: Ambulatory Visit | Attending: Thoracic Surgery (Cardiothoracic Vascular Surgery)

## 2012-06-16 ENCOUNTER — Ambulatory Visit (HOSPITAL_COMMUNITY): Payer: Medicare Other

## 2012-06-16 DIAGNOSIS — E782 Mixed hyperlipidemia: Secondary | ICD-10-CM | POA: Diagnosis present

## 2012-06-16 DIAGNOSIS — Z8249 Family history of ischemic heart disease and other diseases of the circulatory system: Secondary | ICD-10-CM

## 2012-06-16 DIAGNOSIS — Z01811 Encounter for preprocedural respiratory examination: Secondary | ICD-10-CM | POA: Diagnosis not present

## 2012-06-16 DIAGNOSIS — C3490 Malignant neoplasm of unspecified part of unspecified bronchus or lung: Secondary | ICD-10-CM | POA: Diagnosis present

## 2012-06-16 DIAGNOSIS — I4891 Unspecified atrial fibrillation: Secondary | ICD-10-CM | POA: Diagnosis not present

## 2012-06-16 DIAGNOSIS — I6529 Occlusion and stenosis of unspecified carotid artery: Secondary | ICD-10-CM | POA: Diagnosis present

## 2012-06-16 DIAGNOSIS — Y849 Medical procedure, unspecified as the cause of abnormal reaction of the patient, or of later complication, without mention of misadventure at the time of the procedure: Secondary | ICD-10-CM | POA: Diagnosis not present

## 2012-06-16 DIAGNOSIS — I129 Hypertensive chronic kidney disease with stage 1 through stage 4 chronic kidney disease, or unspecified chronic kidney disease: Secondary | ICD-10-CM | POA: Diagnosis present

## 2012-06-16 DIAGNOSIS — E1159 Type 2 diabetes mellitus with other circulatory complications: Secondary | ICD-10-CM | POA: Diagnosis present

## 2012-06-16 DIAGNOSIS — N182 Chronic kidney disease, stage 2 (mild): Secondary | ICD-10-CM | POA: Diagnosis present

## 2012-06-16 DIAGNOSIS — E1129 Type 2 diabetes mellitus with other diabetic kidney complication: Secondary | ICD-10-CM | POA: Diagnosis present

## 2012-06-16 DIAGNOSIS — I519 Heart disease, unspecified: Secondary | ICD-10-CM | POA: Diagnosis not present

## 2012-06-16 DIAGNOSIS — M109 Gout, unspecified: Secondary | ICD-10-CM | POA: Diagnosis present

## 2012-06-16 DIAGNOSIS — C3430 Malignant neoplasm of lower lobe, unspecified bronchus or lung: Principal | ICD-10-CM | POA: Diagnosis present

## 2012-06-16 DIAGNOSIS — J95812 Postprocedural air leak: Secondary | ICD-10-CM | POA: Diagnosis not present

## 2012-06-16 DIAGNOSIS — K8020 Calculus of gallbladder without cholecystitis without obstruction: Secondary | ICD-10-CM | POA: Diagnosis present

## 2012-06-16 DIAGNOSIS — I701 Atherosclerosis of renal artery: Secondary | ICD-10-CM | POA: Diagnosis present

## 2012-06-16 DIAGNOSIS — Z860100 Personal history of colon polyps, unspecified: Secondary | ICD-10-CM

## 2012-06-16 DIAGNOSIS — J4489 Other specified chronic obstructive pulmonary disease: Secondary | ICD-10-CM | POA: Diagnosis present

## 2012-06-16 DIAGNOSIS — Z79899 Other long term (current) drug therapy: Secondary | ICD-10-CM

## 2012-06-16 DIAGNOSIS — R918 Other nonspecific abnormal finding of lung field: Secondary | ICD-10-CM

## 2012-06-16 DIAGNOSIS — Z87891 Personal history of nicotine dependence: Secondary | ICD-10-CM

## 2012-06-16 DIAGNOSIS — D381 Neoplasm of uncertain behavior of trachea, bronchus and lung: Secondary | ICD-10-CM

## 2012-06-16 DIAGNOSIS — I959 Hypotension, unspecified: Secondary | ICD-10-CM | POA: Diagnosis not present

## 2012-06-16 DIAGNOSIS — I70209 Unspecified atherosclerosis of native arteries of extremities, unspecified extremity: Secondary | ICD-10-CM | POA: Diagnosis present

## 2012-06-16 DIAGNOSIS — Z7982 Long term (current) use of aspirin: Secondary | ICD-10-CM

## 2012-06-16 DIAGNOSIS — Z888 Allergy status to other drugs, medicaments and biological substances status: Secondary | ICD-10-CM

## 2012-06-16 DIAGNOSIS — Z01812 Encounter for preprocedural laboratory examination: Secondary | ICD-10-CM

## 2012-06-16 DIAGNOSIS — I10 Essential (primary) hypertension: Secondary | ICD-10-CM | POA: Diagnosis present

## 2012-06-16 DIAGNOSIS — R222 Localized swelling, mass and lump, trunk: Secondary | ICD-10-CM | POA: Diagnosis not present

## 2012-06-16 DIAGNOSIS — Y9210 Unspecified residential institution as the place of occurrence of the external cause: Secondary | ICD-10-CM | POA: Diagnosis not present

## 2012-06-16 DIAGNOSIS — E1151 Type 2 diabetes mellitus with diabetic peripheral angiopathy without gangrene: Secondary | ICD-10-CM | POA: Diagnosis present

## 2012-06-16 DIAGNOSIS — C3492 Malignant neoplasm of unspecified part of left bronchus or lung: Secondary | ICD-10-CM

## 2012-06-16 DIAGNOSIS — I2510 Atherosclerotic heart disease of native coronary artery without angina pectoris: Secondary | ICD-10-CM | POA: Diagnosis present

## 2012-06-16 DIAGNOSIS — N289 Disorder of kidney and ureter, unspecified: Secondary | ICD-10-CM | POA: Diagnosis present

## 2012-06-16 DIAGNOSIS — J449 Chronic obstructive pulmonary disease, unspecified: Secondary | ICD-10-CM | POA: Diagnosis present

## 2012-06-16 DIAGNOSIS — J439 Emphysema, unspecified: Secondary | ICD-10-CM | POA: Diagnosis present

## 2012-06-16 DIAGNOSIS — D62 Acute posthemorrhagic anemia: Secondary | ICD-10-CM | POA: Diagnosis not present

## 2012-06-16 HISTORY — PX: VIDEO ASSISTED THORACOSCOPY: SHX5073

## 2012-06-16 HISTORY — PX: SEGMENTECOMY: SHX5076

## 2012-06-16 LAB — GLUCOSE, CAPILLARY
Glucose-Capillary: 150 mg/dL — ABNORMAL HIGH (ref 70–99)
Glucose-Capillary: 150 mg/dL — ABNORMAL HIGH (ref 70–99)
Glucose-Capillary: 146 mg/dL — ABNORMAL HIGH (ref 70–99)

## 2012-06-16 SURGERY — VIDEO ASSISTED THORACOSCOPY
Anesthesia: General | Site: Chest | Laterality: Left | Wound class: Clean Contaminated

## 2012-06-16 MED ORDER — SODIUM CHLORIDE 0.9 % IV SOLN
10.0000 mg | INTRAVENOUS | Status: DC | PRN
  Administered 2012-06-16: 10 ug/min via INTRAVENOUS

## 2012-06-16 MED ORDER — OXYCODONE HCL 5 MG PO TABS: 5.0000 mg | ORAL_TABLET | Freq: Once | ORAL | Status: DC | PRN

## 2012-06-16 MED ORDER — LACTATED RINGERS IV SOLN
INTRAVENOUS | Status: DC | PRN
  Administered 2012-06-16: 07:00:00 via INTRAVENOUS

## 2012-06-16 MED ORDER — CLONIDINE HCL 0.2 MG PO TABS
0.2000 mg | ORAL_TABLET | Freq: Two times a day (BID) | ORAL | Status: DC
  Administered 2012-06-16 – 2012-06-17 (×2): 0.2 mg via ORAL
  Filled 2012-06-16 (×5): qty 1

## 2012-06-16 MED ORDER — HYDROMORPHONE HCL PF 1 MG/ML IJ SOLN
0.2500 mg | INTRAMUSCULAR | Status: DC | PRN
  Administered 2012-06-16: 0.5 mg via INTRAVENOUS

## 2012-06-16 MED ORDER — POTASSIUM CHLORIDE IN NACL 20-0.9 MEQ/L-% IV SOLN
INTRAVENOUS | Status: DC
  Administered 2012-06-16: 100 mL/h via INTRAVENOUS
  Filled 2012-06-16 (×4): qty 1000

## 2012-06-16 MED ORDER — ONDANSETRON HCL 4 MG/2ML IJ SOLN: 4.0000 mg | Freq: Four times a day (QID) | INTRAMUSCULAR | Status: DC | PRN

## 2012-06-16 MED ORDER — DIPHENHYDRAMINE HCL 50 MG/ML IJ SOLN
12.5000 mg | Freq: Four times a day (QID) | INTRAMUSCULAR | Status: DC | PRN
  Filled 2012-06-16: qty 0.25

## 2012-06-16 MED ORDER — OXYCODONE HCL 5 MG/5ML PO SOLN: 5.0000 mg | Freq: Once | ORAL | Status: DC | PRN

## 2012-06-16 MED ORDER — FENTANYL CITRATE 0.05 MG/ML IJ SOLN
INTRAMUSCULAR | Status: DC | PRN
  Administered 2012-06-16: 50 ug via INTRAVENOUS
  Administered 2012-06-16: 100 ug via INTRAVENOUS
  Administered 2012-06-16: 75 ug via INTRAVENOUS
  Administered 2012-06-16: 25 ug via INTRAVENOUS
  Administered 2012-06-16: 50 ug via INTRAVENOUS

## 2012-06-16 MED ORDER — ONDANSETRON HCL 4 MG/2ML IJ SOLN
4.0000 mg | Freq: Four times a day (QID) | INTRAMUSCULAR | Status: DC | PRN
  Filled 2012-06-16: qty 2

## 2012-06-16 MED ORDER — METOPROLOL SUCCINATE ER 50 MG PO TB24
50.0000 mg | ORAL_TABLET | Freq: Every day | ORAL | Status: DC
  Administered 2012-06-17 – 2012-06-23 (×6): 50 mg via ORAL
  Filled 2012-06-16 (×8): qty 1

## 2012-06-16 MED ORDER — LEVALBUTEROL HCL 0.63 MG/3ML IN NEBU
0.6300 mg | INHALATION_SOLUTION | Freq: Four times a day (QID) | RESPIRATORY_TRACT | Status: AC
Start: 2012-06-16 — End: 2012-06-18
  Administered 2012-06-16 – 2012-06-18 (×8): 0.63 mg via RESPIRATORY_TRACT
  Filled 2012-06-16 (×9): qty 3

## 2012-06-16 MED ORDER — PHENYLEPHRINE HCL 10 MG/ML IJ SOLN
INTRAMUSCULAR | Status: DC | PRN
  Administered 2012-06-16: 20 ug via INTRAVENOUS
  Administered 2012-06-16 (×2): 40 ug via INTRAVENOUS

## 2012-06-16 MED ORDER — BUPIVACAINE 0.5 % ON-Q PUMP SINGLE CATH 400 ML
400.0000 mL | INJECTION | Status: DC
  Administered 2012-06-16: 400 mL
  Filled 2012-06-16: qty 400

## 2012-06-16 MED ORDER — NALOXONE HCL 0.4 MG/ML IJ SOLN
0.4000 mg | INTRAMUSCULAR | Status: DC | PRN
  Filled 2012-06-16: qty 1

## 2012-06-16 MED ORDER — ONDANSETRON HCL 4 MG/2ML IJ SOLN
INTRAMUSCULAR | Status: DC | PRN
  Administered 2012-06-16: 4 mg via INTRAVENOUS

## 2012-06-16 MED ORDER — POTASSIUM CHLORIDE 10 MEQ/50ML IV SOLN: 10.0000 meq | Freq: Every day | INTRAVENOUS | Status: DC | PRN

## 2012-06-16 MED ORDER — ARTIFICIAL TEARS OP OINT
TOPICAL_OINTMENT | OPHTHALMIC | Status: DC | PRN
  Administered 2012-06-16: 1 via OPHTHALMIC

## 2012-06-16 MED ORDER — EPHEDRINE SULFATE 50 MG/ML IJ SOLN
INTRAMUSCULAR | Status: DC | PRN
  Administered 2012-06-16 (×3): 10 mg via INTRAVENOUS

## 2012-06-16 MED ORDER — BISACODYL 5 MG PO TBEC
10.0000 mg | DELAYED_RELEASE_TABLET | Freq: Every day | ORAL | Status: DC
  Administered 2012-06-17 – 2012-06-23 (×6): 10 mg via ORAL
  Filled 2012-06-16 (×7): qty 2

## 2012-06-16 MED ORDER — GLYCOPYRROLATE 0.2 MG/ML IJ SOLN
INTRAMUSCULAR | Status: DC | PRN
  Administered 2012-06-16: .6 mg via INTRAVENOUS
  Administered 2012-06-16: 0.2 mg via INTRAVENOUS

## 2012-06-16 MED ORDER — BUPIVACAINE HCL 0.5 % IJ SOLN
INTRAMUSCULAR | Status: DC | PRN
  Administered 2012-06-16: 30 mL

## 2012-06-16 MED ORDER — FENTANYL 10 MCG/ML IV SOLN
INTRAVENOUS | Status: DC
  Administered 2012-06-16: 10 ug via INTRAVENOUS
  Administered 2012-06-16: 13:00:00 via INTRAVENOUS
  Administered 2012-06-16 – 2012-06-17 (×2): 10 ug via INTRAVENOUS
  Administered 2012-06-17: 50 ug via INTRAVENOUS
  Administered 2012-06-17: 80 ug via INTRAVENOUS
  Administered 2012-06-17: 10 ug via INTRAVENOUS
  Administered 2012-06-17: 30 ug via INTRAVENOUS
  Administered 2012-06-18 (×2): 40 ug via INTRAVENOUS
  Administered 2012-06-18: 70 ug via INTRAVENOUS
  Administered 2012-06-18: 30 ug via INTRAVENOUS
  Administered 2012-06-18: 39.4 ug via INTRAVENOUS
  Administered 2012-06-18: 80 ug via INTRAVENOUS
  Administered 2012-06-19: 40 ug via INTRAVENOUS
  Administered 2012-06-19: 50 ug via INTRAVENOUS
  Administered 2012-06-19: 40 ug via INTRAVENOUS
  Administered 2012-06-19: 36 ug via INTRAVENOUS
  Administered 2012-06-19 (×2): 40 ug via INTRAVENOUS
  Administered 2012-06-20: 20 ug via INTRAVENOUS
  Administered 2012-06-20 (×2): 30 ug via INTRAVENOUS
  Administered 2012-06-20: 50 ug via INTRAVENOUS
  Administered 2012-06-20: 20 ug via INTRAVENOUS
  Administered 2012-06-20: 40 ug via INTRAVENOUS
  Administered 2012-06-21: 10 ug via INTRAVENOUS
  Administered 2012-06-21: 50 ug via INTRAVENOUS
  Administered 2012-06-21: 20 ug via INTRAVENOUS
  Administered 2012-06-21: 60 ug via INTRAVENOUS
  Administered 2012-06-21: 70 ug via INTRAVENOUS
  Administered 2012-06-21 – 2012-06-22 (×2): 20 ug via INTRAVENOUS
  Administered 2012-06-22: 90 ug via INTRAVENOUS
  Filled 2012-06-16 (×3): qty 50

## 2012-06-16 MED ORDER — NEOSTIGMINE METHYLSULFATE 1 MG/ML IJ SOLN
INTRAMUSCULAR | Status: DC | PRN
  Administered 2012-06-16: 3 mg via INTRAVENOUS

## 2012-06-16 MED ORDER — BUPIVACAINE HCL (PF) 0.5 % IJ SOLN
INTRAMUSCULAR | Status: AC
  Filled 2012-06-16: qty 30

## 2012-06-16 MED ORDER — SENNOSIDES-DOCUSATE SODIUM 8.6-50 MG PO TABS
1.0000 | ORAL_TABLET | Freq: Every evening | ORAL | Status: DC | PRN
  Filled 2012-06-16: qty 1

## 2012-06-16 MED ORDER — ONDANSETRON HCL 4 MG/2ML IJ SOLN: 4.0000 mg | Freq: Once | INTRAMUSCULAR | Status: DC | PRN

## 2012-06-16 MED ORDER — 0.9 % SODIUM CHLORIDE (POUR BTL) OPTIME
TOPICAL | Status: DC | PRN
  Administered 2012-06-16: 3000 mL

## 2012-06-16 MED ORDER — LOSARTAN POTASSIUM 50 MG PO TABS
50.0000 mg | ORAL_TABLET | Freq: Every day | ORAL | Status: DC
  Filled 2012-06-16: qty 1

## 2012-06-16 MED ORDER — CEFUROXIME SODIUM 1.5 G IJ SOLR
1.5000 g | Freq: Two times a day (BID) | INTRAMUSCULAR | Status: AC
  Administered 2012-06-16 – 2012-06-17 (×2): 1.5 g via INTRAVENOUS
  Filled 2012-06-16 (×2): qty 1.5

## 2012-06-16 MED ORDER — ROCURONIUM BROMIDE 100 MG/10ML IV SOLN
INTRAVENOUS | Status: DC | PRN
  Administered 2012-06-16: 10 mg via INTRAVENOUS
  Administered 2012-06-16: 50 mg via INTRAVENOUS
  Administered 2012-06-16 (×2): 10 mg via INTRAVENOUS

## 2012-06-16 MED ORDER — NIACIN ER 500 MG PO CPCR
500.0000 mg | ORAL_CAPSULE | Freq: Every day | ORAL | Status: DC
  Administered 2012-06-17 – 2012-06-22 (×7): 500 mg via ORAL
  Filled 2012-06-16 (×9): qty 1

## 2012-06-16 MED ORDER — PROPOFOL 10 MG/ML IV BOLUS
INTRAVENOUS | Status: DC | PRN
  Administered 2012-06-16: 200 mg via INTRAVENOUS

## 2012-06-16 MED ORDER — HEMOSTATIC AGENTS (NO CHARGE) OPTIME
TOPICAL | Status: DC | PRN
  Administered 2012-06-16: 1 via TOPICAL

## 2012-06-16 MED ORDER — DIPHENHYDRAMINE HCL 12.5 MG/5ML PO ELIX
12.5000 mg | ORAL_SOLUTION | Freq: Four times a day (QID) | ORAL | Status: DC | PRN
  Filled 2012-06-16: qty 5

## 2012-06-16 MED ORDER — MIDAZOLAM HCL 5 MG/5ML IJ SOLN
INTRAMUSCULAR | Status: DC | PRN
  Administered 2012-06-16: 1 mg via INTRAVENOUS

## 2012-06-16 MED ORDER — ACETAMINOPHEN 325 MG PO TABS: 650.0000 mg | ORAL_TABLET | Freq: Four times a day (QID) | ORAL | Status: DC | PRN

## 2012-06-16 MED ORDER — SIMVASTATIN 10 MG PO TABS
10.0000 mg | ORAL_TABLET | Freq: Every day | ORAL | Status: DC
  Administered 2012-06-16 – 2012-06-22 (×7): 10 mg via ORAL
  Filled 2012-06-16 (×8): qty 1

## 2012-06-16 MED ORDER — HYDROMORPHONE HCL PF 1 MG/ML IJ SOLN
INTRAMUSCULAR | Status: AC
  Administered 2012-06-16: 0.5 mg via INTRAVENOUS
  Filled 2012-06-16: qty 1

## 2012-06-16 MED ORDER — SODIUM CHLORIDE 0.9 % IJ SOLN
9.0000 mL | INTRAMUSCULAR | Status: DC | PRN
  Administered 2012-06-16: 9 mL via INTRAVENOUS

## 2012-06-16 MED ORDER — TRAMADOL HCL 50 MG PO TABS
50.0000 mg | ORAL_TABLET | Freq: Four times a day (QID) | ORAL | Status: DC | PRN
Start: 2012-06-16 — End: 2012-06-22
  Administered 2012-06-17: 50 mg via ORAL
  Filled 2012-06-16 (×2): qty 1

## 2012-06-16 MED ORDER — PANTOPRAZOLE SODIUM 40 MG PO TBEC
40.0000 mg | DELAYED_RELEASE_TABLET | Freq: Every day | ORAL | Status: DC
  Administered 2012-06-16 – 2012-06-23 (×8): 40 mg via ORAL
  Filled 2012-06-16 (×6): qty 1

## 2012-06-16 MED ORDER — MEPERIDINE HCL 25 MG/ML IJ SOLN: 6.2500 mg | INTRAMUSCULAR | Status: DC | PRN

## 2012-06-16 MED ORDER — NIACIN ER (ANTIHYPERLIPIDEMIC) 500 MG PO TBCR
500.0000 mg | EXTENDED_RELEASE_TABLET | Freq: Every day | ORAL | Status: DC
  Filled 2012-06-16 (×2): qty 1

## 2012-06-16 MED ORDER — ASPIRIN EC 81 MG PO TBEC
81.0000 mg | DELAYED_RELEASE_TABLET | Freq: Every evening | ORAL | Status: DC
  Administered 2012-06-17 – 2012-06-22 (×6): 81 mg via ORAL
  Filled 2012-06-16 (×7): qty 1

## 2012-06-16 MED ORDER — INSULIN ASPART 100 UNIT/ML ~~LOC~~ SOLN
0.0000 [IU] | SUBCUTANEOUS | Status: DC
  Administered 2012-06-16 – 2012-06-17 (×6): 2 [IU] via SUBCUTANEOUS

## 2012-06-16 SURGICAL SUPPLY — 90 items
ADH SKN CLS LQ APL DERMABOND (GAUZE/BANDAGES/DRESSINGS) ×2
APL SKNCLS STERI-STRIP NONHPOA (GAUZE/BANDAGES/DRESSINGS) ×2
APPLIER CLIP ROT 10 11.4 M/L (STAPLE)
APR CLP MED LRG 11.4X10 (STAPLE)
BAG SPEC RTRVL LRG 6X4 10 (ENDOMECHANICALS) ×2
BENZOIN TINCTURE PRP APPL 2/3 (GAUZE/BANDAGES/DRESSINGS) ×1 IMPLANT
CANISTER SUCTION 2500CC (MISCELLANEOUS) ×3 IMPLANT
CATH KIT ON Q 5IN SLV (PAIN MANAGEMENT) ×1 IMPLANT
CATH THORACIC 28FR (CATHETERS) ×1 IMPLANT
CATH THORACIC 28FR RT ANG (CATHETERS) IMPLANT
CATH THORACIC 36FR (CATHETERS) IMPLANT
CATH THORACIC 36FR RT ANG (CATHETERS) ×1 IMPLANT
CLIP APPLIE ROT 10 11.4 M/L (STAPLE) IMPLANT
CLIP TI MEDIUM 6 (CLIP) IMPLANT
CLOTH BEACON ORANGE TIMEOUT ST (SAFETY) ×3 IMPLANT
CONN Y 3/8X3/8X3/8  BEN (MISCELLANEOUS) ×1
CONN Y 3/8X3/8X3/8 BEN (MISCELLANEOUS) ×2 IMPLANT
CONT SPEC 4OZ CLIKSEAL STRL BL (MISCELLANEOUS) ×12 IMPLANT
DERMABOND ADHESIVE PROPEN (GAUZE/BANDAGES/DRESSINGS) ×1
DERMABOND ADVANCED .7 DNX6 (GAUZE/BANDAGES/DRESSINGS) IMPLANT
DRAPE LAPAROSCOPIC ABDOMINAL (DRAPES) ×3 IMPLANT
DRAPE WARM FLUID 44X44 (DRAPE) ×3 IMPLANT
DRSG OPSITE 4X5.5 SM (GAUZE/BANDAGES/DRESSINGS) ×1 IMPLANT
ELECT REM PT RETURN 9FT ADLT (ELECTROSURGICAL) ×3
ELECTRODE REM PT RTRN 9FT ADLT (ELECTROSURGICAL) ×2 IMPLANT
GLOVE BIO SURGEON STRL SZ 6.5 (GLOVE) ×3 IMPLANT
GLOVE BIOGEL M 6.5 STRL (GLOVE) ×1 IMPLANT
GLOVE BIOGEL PI IND STRL 6.5 (GLOVE) IMPLANT
GLOVE BIOGEL PI IND STRL 7.0 (GLOVE) IMPLANT
GLOVE BIOGEL PI INDICATOR 6.5 (GLOVE) ×3
GLOVE BIOGEL PI INDICATOR 7.0 (GLOVE) ×3
GLOVE EUDERMIC 7 POWDERFREE (GLOVE) ×6 IMPLANT
GOWN PREVENTION PLUS XLARGE (GOWN DISPOSABLE) ×3 IMPLANT
GOWN STRL NON-REIN LRG LVL3 (GOWN DISPOSABLE) ×6 IMPLANT
HANDLE STAPLE ENDO GIA SHORT (STAPLE) ×1
HEMOSTAT SURGICEL 2X14 (HEMOSTASIS) IMPLANT
KIT BASIN OR (CUSTOM PROCEDURE TRAY) ×3 IMPLANT
KIT ROOM TURNOVER OR (KITS) ×3 IMPLANT
KIT SUCTION CATH 14FR (SUCTIONS) ×3 IMPLANT
NS IRRIG 1000ML POUR BTL (IV SOLUTION) ×7 IMPLANT
PACK CHEST (CUSTOM PROCEDURE TRAY) ×3 IMPLANT
PAD ARMBOARD 7.5X6 YLW CONV (MISCELLANEOUS) ×6 IMPLANT
POUCH ENDO CATCH II 15MM (MISCELLANEOUS) IMPLANT
POUCH SPECIMEN RETRIEVAL 10MM (ENDOMECHANICALS) ×1 IMPLANT
RELOAD EGIA 45 TAN VASC (STAPLE) ×1 IMPLANT
RELOAD EGIA 60 MED/THCK PURPLE (STAPLE) ×15 IMPLANT
RELOAD EGIA BLACK ROTIC 45MM (STAPLE) ×3 IMPLANT
RELOAD EGIA TRIS TAN 45 CVD (STAPLE) ×12 IMPLANT
RELOAD ENDO GIA 30 3.5 (STAPLE) ×2 IMPLANT
RELOAD GIA II 30 3.5 (ENDOMECHANICALS) ×1 IMPLANT
RELOAD STAPLE 45 BLK XTHK (STAPLE) IMPLANT
RELOAD STAPLE 45 TAN MED CVD (STAPLE) IMPLANT
RELOAD STAPLE 60 BLK XTHK ART (STAPLE) IMPLANT
RELOAD STAPLE 60 MED/THCK ART (STAPLE) IMPLANT
RELOAD TRI 2.0 60 XTHK VAS SUL (STAPLE) ×6 IMPLANT
SEALANT PROGEL (MISCELLANEOUS) ×1 IMPLANT
SEALANT SURG COSEAL 4ML (VASCULAR PRODUCTS) IMPLANT
SEALANT SURG COSEAL 8ML (VASCULAR PRODUCTS) IMPLANT
SOLUTION ANTI FOG 6CC (MISCELLANEOUS) ×3 IMPLANT
SPECIMEN JAR MEDIUM (MISCELLANEOUS) ×3 IMPLANT
SPONGE GAUZE 4X4 12PLY (GAUZE/BANDAGES/DRESSINGS) ×3 IMPLANT
SPONGE INTESTINAL PEANUT (DISPOSABLE) ×1 IMPLANT
STAPLER ENDO GIA 12 SHRT THIN (STAPLE) IMPLANT
STAPLER ENDO GIA 12MM SHORT (STAPLE) ×2 IMPLANT
SUT PROLENE 4 0 RB 1 (SUTURE) ×3
SUT PROLENE 4-0 RB1 .5 CRCL 36 (SUTURE) IMPLANT
SUT SILK  1 MH (SUTURE) ×3
SUT SILK 1 MH (SUTURE) ×4 IMPLANT
SUT SILK 2 0SH CR/8 30 (SUTURE) IMPLANT
SUT SILK 3 0 SH CR/8 (SUTURE) ×1 IMPLANT
SUT SILK 3 0SH CR/8 30 (SUTURE) IMPLANT
SUT VIC AB 1 CTX 36 (SUTURE) ×3
SUT VIC AB 1 CTX36XBRD ANBCTR (SUTURE) IMPLANT
SUT VIC AB 2-0 CTX 36 (SUTURE) ×1 IMPLANT
SUT VIC AB 2-0 UR6 27 (SUTURE) IMPLANT
SUT VIC AB 3-0 MH 27 (SUTURE) IMPLANT
SUT VIC AB 3-0 X1 27 (SUTURE) ×1 IMPLANT
SUT VICRYL 2 TP 1 (SUTURE) IMPLANT
SWAB COLLECTION DEVICE MRSA (MISCELLANEOUS) IMPLANT
SYSTEM SAHARA CHEST DRAIN ATS (WOUND CARE) ×3 IMPLANT
TAPE CLOTH 4X10 WHT NS (GAUZE/BANDAGES/DRESSINGS) ×3 IMPLANT
TAPE CLOTH SURG 4X10 WHT LF (GAUZE/BANDAGES/DRESSINGS) ×1 IMPLANT
TIP APPLICATOR SPRAY EXTEND 16 (VASCULAR PRODUCTS) ×1 IMPLANT
TOWEL OR 17X24 6PK STRL BLUE (TOWEL DISPOSABLE) ×3 IMPLANT
TOWEL OR 17X26 10 PK STRL BLUE (TOWEL DISPOSABLE) ×6 IMPLANT
TRAP SPECIMEN MUCOUS 40CC (MISCELLANEOUS) IMPLANT
TRAY FOLEY CATH 14FRSI W/METER (CATHETERS) ×3 IMPLANT
TUBE ANAEROBIC SPECIMEN COL (MISCELLANEOUS) IMPLANT
TUNNELER SHEATH ON-Q 16GX12 DP (PAIN MANAGEMENT) ×1 IMPLANT
WATER STERILE IRR 1000ML POUR (IV SOLUTION) ×5 IMPLANT

## 2012-06-16 NOTE — Transfer of Care (Signed)
Immediate Anesthesia Transfer of Care Note  Patient: Benjamin Patel  Procedure(s) Performed: Procedure(s) with comments: VIDEO ASSISTED THORACOSCOPY (Left) left lower lobe superior SEGMENTECTOMY with node dissection (Left) - left superior  Patient Location: PACU  Anesthesia Type:General  Level of Consciousness: awake  Airway & Oxygen Therapy: Patient Spontanous Breathing and Patient connected to nasal cannula oxygen  Post-op Assessment: Report given to PACU RN and Post -op Vital signs reviewed and stable  Post vital signs: Reviewed and stable  Complications: No apparent anesthesia complications

## 2012-06-16 NOTE — Addendum Note (Signed)
Addendum created 06/16/12 1511 by Hessie Dibble, CRNA   Modules edited: Anesthesia Flowsheet, Anesthesia Medication Administration

## 2012-06-16 NOTE — Progress Notes (Signed)
[facility] BRIEF SICU PROGRESS NOTE  Day of Surgery  S/P Procedure(s) (LRB): VIDEO ASSISTED THORACOSCOPY (Left) left lower lobe superior SEGMENTECTOMY with node dissection (Left)   Breathing comfortably, pain well controlled NSR w/ stable BP + air leak UOP adequate  Plan: Continue routine early postop  OWEN,CLARENCE H 06/16/2012 8:05 PM

## 2012-06-16 NOTE — Interval H&P Note (Signed)
History and Physical Interval Note:  06/16/2012 7:32 AM  Benjamin Patel  has presented today for surgery, with the diagnosis of left lower lobe mass  The various methods of treatment have been discussed with the patient and family. After consideration of risks, benefits and other options for treatment, the patient has consented to  Procedure(s) with comments: VIDEO ASSISTED THORACOSCOPY (Left) SEGMENTECTOMY (Left) - left superior as a surgical intervention .  The patient's history has been reviewed, patient examined, no change in status, stable for surgery.  I have reviewed the patient's chart and labs.  Questions were answered to the patient's satisfaction.    Stress test low risk- will proceed with resection   Lois Slagel C

## 2012-06-16 NOTE — Progress Notes (Signed)
Utilization Review Completed. 06/16/2012

## 2012-06-16 NOTE — H&P (Signed)
PCP is DAUB, Stan Head, MD Referring Provider is Nyoka Cowden, MD    Chief Complaint   Patient presents with   .  Lung Mass       surgical eval on lung mass, Chest CT 04/25/12, PET Scan 05/05/12        HPI: 79 yo man presents with a cc/o a lung mass.   Benjamin Patel is an 79 yo WM with a history of tobacco abuse(quit in 1978) and newly diagnosed COPD. He was in his usual state of good health until January of this year. He developed a persistent cough. It was nonproductive initially but towards the end it was productive with yellow sputum. He had a CXR which showed a possible left lung mass. A CT of the chest was done which confirmed a mass in the LLL. He was treated with antibiotics and the nodule initially got a little smaller. He recently had a follow up CT which showed the mass had increased in size. A PET was done and the lesion was hypermetabolic. There was no evidence of regional or distant metastases.   He says that he is very active. He does not have a regular exercise program, but does something active everyday. He has 2 flights of stairs in the home and can walk up both without getting SOB. He denies any CP or SOB, wheezing, hemoptysis, wt loss, change in appetite.   ZUBROD score = 0    Past Medical History   Diagnosis  Date   .  Essential hypertension, benign     .  Mixed hyperlipidemia     .  Asthma     .  Gout     .  Type II or unspecified type diabetes mellitus without mention of complication, not stated as uncontrolled     .  Skin cancer     .  Abnormal prostate exam     .  Carotid stenosis     .  Cholelithiasis     .  Renal insufficiency     .  Renal artery stenosis         right   .  PAC (premature atrial contraction)     .  PVCs (premature ventricular contractions)     .  Pneumonia  02/08/2012         Past Surgical History   Procedure  Laterality  Date   .  No past surgeries             Family History   Problem  Relation  Age of Onset   .  Heart  disease  Mother     .  Cancer  Father          Social History History   Substance Use Topics   .  Smoking status:  Former Smoker -- 1.50 packs/day for 30 years       Types:  Cigarettes       Quit date:  01/16/1976   .  Smokeless tobacco:  Never Used   .  Alcohol Use:  No         Current Outpatient Prescriptions   Medication  Sig  Dispense  Refill   .  albuterol (PROVENTIL HFA;VENTOLIN HFA) 108 (90 BASE) MCG/ACT inhaler  Use 1 puff every 4 hours as needed for cough   1 Inhaler   1   .  aspirin EC 81 MG tablet  Take 81 mg by mouth every evening.         Marland Kitchen  cloNIDine (CATAPRES) 0.2 MG tablet  Take 0.2 mg by mouth 2 (two) times daily.         .  cloNIDine (CATAPRES) 0.2 MG tablet  TAKE 1 TABLET BY MOUTH TWICE A DAY   60 tablet   5   .  hydrochlorothiazide (MICROZIDE) 12.5 MG capsule  Take 12.5 mg by mouth daily.         Marland Kitchen  losartan (COZAAR) 50 MG tablet  TAKE 1 TABLET EVERY DAY   30 tablet   2   .  metFORMIN (GLUCOPHAGE) 500 MG tablet  Take 500 mg by mouth 2 (two) times daily with a meal.         .  metoprolol succinate (TOPROL-XL) 100 MG 24 hr tablet  TAKE 1 TABLET BY MOUTH DAILY. TAKE WITH OR IMMEDIATELY FOLLOWING A MEAL.   30 tablet   3   .  niacin (NIASPAN) 500 MG CR tablet  Take 500 mg by mouth at bedtime.         .  pravastatin (PRAVACHOL) 20 MG tablet  Take 20 mg by mouth every evening.         .  pravastatin (PRAVACHOL) 20 MG tablet  TAKE 1 TABLET BY MOUTH EVERY DAY   30 tablet   5       No current facility-administered medications for this visit.         Allergies   Allergen  Reactions   .  Ace Inhibitors  Cough   .  Codeine  Other (See Comments)       GI Upset   .  Indomethacin  Other (See Comments)       Elevated BP        Review of Systems  Constitutional: Negative for fever, chills, activity change and unexpected weight change.  Eyes: Negative.   Respiratory: Positive for cough. Negative for chest tightness, shortness of breath and wheezing.    Cardiovascular: Negative for chest pain, palpitations and leg swelling.  Gastrointestinal: Negative.   Endocrine: Negative.   Musculoskeletal: Positive for joint swelling (history of gout).  Neurological: Negative.   Hematological: Negative.       BP 147/64  Pulse 65  Temp(Src) 98 F (36.7 C)  Resp 20  Ht 5\' 8"  (1.727 m)  Wt 165 lb 11.2 oz (75.161 kg)  BMI 25.2 kg/m2  SpO2 % Physical Exam  Vitals reviewed. Constitutional: He is oriented to person, place, and time. He appears well-developed and well-nourished. No distress.  HENT:   Head: Normocephalic and atraumatic.  Eyes: EOM are normal. Pupils are equal, round, and reactive to light.  Neck: Neck supple. No thyromegaly present.  + bilateral bruits  Cardiovascular: Normal rate, regular rhythm, normal heart sounds and intact distal pulses.  Exam reveals no gallop and no friction rub.    No murmur heard. Pulmonary/Chest: Effort normal and breath sounds normal. He has no wheezes. He has no rales.  Abdominal: Soft. There is no tenderness.  Musculoskeletal: Normal range of motion. He exhibits no edema.  Lymphadenopathy:    He has no cervical adenopathy.  Neurological: He is alert and oriented to person, place, and time. No cranial nerve deficit.  Skin: Skin is warm and dry.  Psychiatric: He has a normal mood and affect.        Diagnostic Tests: CT CHEST WITHOUT CONTRAST 04/29/12 Technique: Multidetector CT imaging of the chest was performed   following the standard protocol without IV contrast.   Comparison: Plain films  including 04/21/2012 and CT of 02/08/2012.   Findings: Lungs/pleura: Incompletely imaged probable secretions in   the nondependent trachea on image 1/series 3. Moderate   centrilobular emphysema.   Minimal subpleural scarring in the right lower lobe on image   38/series 3.   Spiculated nodule within the superior segment left lower lobe   measures 2.0 x 1.6 cm on image 34/series 3. 1.5 x 1.4 cm at the    same level on the prior exam. On sagittal image 117, this measures   2.0 x 1.7 cm today versus similar on the prior. No pleural fluid.   Heart/Mediastinum: Aortic and coronary artery atherosclerosis.   Mild cardiomegaly, without pericardial effusion. No mediastinal or   definite hilar adenopathy, given limitations of unenhanced CT.   Upper abdomen: Cholelithiasis. Bilateral renal collecting system   calculi. Probable right renal atrophy. Minimal right adrenal   nodularity which is unchanged.   Bones/Musculoskeletal: Remote left posterior tenth rib trauma.   IMPRESSION:   1. Similar to minimal enlargement of a superior segment left lower   lobe lung nodule, highly suspicious for primary bronchogenic   carcinoma.   2. No evidence of metastatic disease.   3. Cholelithiasis and nephrolithiasis.       NUCLEAR MEDICINE PET SKULL BASE TO THIGH   Fasting Blood Glucose: 130   Technique: 18.7 mCi F-18 FDG was injected intravenously. CT data   was obtained and used for attenuation correction and anatomic   localization only. (This was not acquired as a diagnostic CT   examination.) Additional exam technical data entered on   technologist worksheet.   Comparison: CT 01/23/2022 2014, 04/29/2012   Findings:   Neck: No hypermetabolic nodes in the neck.   Chest: Within the left lower lobe, there is a round 21 x 20 mm   nodule with apparent increase in size from 17 x 15 mm on CT of   02/08/2012. This lesion is intensely hypermetabolic with SUV max =   14.6. There are no additional hypermetabolic pulmonary nodules.   There are no hypermetabolic mediastinal lymph nodes. Small   mediastinal lymph nodes are not pathologic by size criteria.   Coronary calcifications are noted. No pleural fluid.   Abdomen / Pelvis:No abnormal hypermetabolic activity within the   solid organs. No evidence of abdominal or pelvic hypermetabolic   nodes. Multiple gallstones noted. There is a simple cyst within   the  left kidney. Multiple diverticula of the sigmoid colon.   Prostate hypertrophy.   Skeleton:No focal hypermetabolic activity to suggest skeletal   metastasis.   IMPRESSION:   1. Hypermetabolic left lower lobe pulmonary nodule is most   concerning for bronchogenic carcinoma. If indeed the lesion is   carcinoma, FDG PET/CT staging T1b N0 M0   2. Cholelithiasis and diverticulosis.   PFTs   FEV1 0.86(34%), 0.91 after bronchodilators FEV1/FVC 48% DLCO 35%     Impression: 79 yo male with a history of smoking with a 2 cm mass in the superior segment of the left lower lobe. The lesion has grown over a relatively short period of time and is hypermetabolic on PET. It is highly suspicious for a primary lung cancer and has to be considered that until proven otherwise.   We discussed the differential diagnosis and the ways to go about making a diagnosis, including bronch, needle biopsy and surgical biopsy. We discussed the treatment options for early stage lung cancer- surgery v radiation.   He has marginal lung function with an FEV  of 0.9. He would not be a candidate for a lobectomy but given the size and location of the lesion he is a candidate for a segmentectomy. He looks much better in person than his numbers would indicate and has good exercise tolerance.    I recommended left VATS and superior segmentectomy, with the other option being SBRT.    I discussed with the patient and his wife the general nature of the procedure, need for general anesthesia, and incisions to be used. I have discussed the expected hospital stay, overall recovery and short and long term outcomes. They understand the risks include, but are not limited to death, stroke, MI, DVT/PE, bleeding, possible need for transfusion, infections, prolonged air leak, and other organ system dysfunction including respiratory, renal, or GI complications. They understand he is at significantly increased risk of pulmonary complications.  He accepts the risks and agrees to proceed.   He has severe coronary artery calcification on CT and needs Cardiology clearance prior to surgery. He has seen Dr. Anne Fu in the past.     Plan: Cardiology clearance with Dr. Anne Fu   Left VATS, superior segmentectomy

## 2012-06-16 NOTE — Preoperative (Signed)
Beta Blockers   Reason not to administer Beta Blockers:Not Applicable, took this AM 

## 2012-06-16 NOTE — Brief Op Note (Addendum)
06/16/2012  11:06 AM  PATIENT:  Emmaline Kluver Hitt  79 y.o. male  PRE-OPERATIVE DIAGNOSIS:  left lower lobe mass  POST-OPERATIVE DIAGNOSIS: Stage IA non-small cell lung cancer  PROCEDURE:  LEFT VIDEO ASSISTED THORACOSCOPY, LEFT LOWER LOBE SUPERIOR SEGMENTECTOMY, LYMPH NODE SAMPLING, and On Q placement  SURGEON:  Surgeon(s) and Role:    * Loreli Slot, MD - Primary  PHYSICIAN ASSISTANT: Doree Fudge PA-C   ANESTHESIA:   general  EBL:  Total I/O In: 1000 [I.V.:1000] Out: 350 [Urine:250; Blood:100]  BLOOD ADMINISTERED:none  DRAINS: One 91 French straight and one 36 French right angle Chest Tube(s) in the left pleural space   LOCAL MEDICATIONS USED:  BUPIVICAINE   SPECIMEN:  Source of Specimen:  Superior segmentectomy of left lower lobe  DISPOSITION OF SPECIMEN:  PATHOLOGY  COUNTS CORRECT:  YES  PLAN OF CARE: Admit to inpatient   PATIENT DISPOSITION:  PACU - hemodynamically stable.   Delay start of Pharmacological VTE agent (>24hrs) due to surgical blood loss or risk of bleeding: yes  Frozen + non-small cell lung cancer, margins negative

## 2012-06-16 NOTE — Anesthesia Procedure Notes (Signed)
Performed by: Everlene Balls TODD

## 2012-06-16 NOTE — Anesthesia Postprocedure Evaluation (Signed)
Anesthesia Post Note  Patient: Benjamin Patel  Procedure(s) Performed: Procedure(s) (LRB): VIDEO ASSISTED THORACOSCOPY (Left) left lower lobe superior SEGMENTECTOMY with node dissection (Left)  Anesthesia type: general  Patient location: PACU  Post pain: Pain level controlled  Post assessment: Patient's Cardiovascular Status Stable  Last Vitals:  Filed Vitals:   06/16/12 1315  BP: 152/65  Pulse: 70  Temp: 36.4 C  Resp: 16    Post vital signs: Reviewed and stable  Level of consciousness: sedated  Complications: No apparent anesthesia complications

## 2012-06-16 NOTE — Anesthesia Preprocedure Evaluation (Signed)
Anesthesia Evaluation  Patient identified by MRN, date of birth, ID band Patient awake    Reviewed: Allergy & Precautions, H&P , NPO status , Patient's Chart, lab work & pertinent test results  Airway Mallampati: I TM Distance: >3 FB Neck ROM: Full    Dental   Pulmonary pneumonia -, COPD         Cardiovascular hypertension, Pt. on medications + Peripheral Vascular Disease + dysrhythmias Atrial Fibrillation     Neuro/Psych    GI/Hepatic   Endo/Other  diabetes, Well Controlled, Type 2, Oral Hypoglycemic Agents  Renal/GU      Musculoskeletal   Abdominal   Peds  Hematology   Anesthesia Other Findings   Reproductive/Obstetrics                           Anesthesia Physical Anesthesia Plan  ASA: III  Anesthesia Plan: General   Post-op Pain Management:    Induction: Intravenous  Airway Management Planned: Double Lumen EBT  Additional Equipment: Arterial line, CVP and Ultrasound Guidance Line Placement  Intra-op Plan:   Post-operative Plan: Extubation in OR  Informed Consent: I have reviewed the patients History and Physical, chart, labs and discussed the procedure including the risks, benefits and alternatives for the proposed anesthesia with the patient or authorized representative who has indicated his/her understanding and acceptance.     Plan Discussed with: CRNA and Surgeon  Anesthesia Plan Comments:         Anesthesia Quick Evaluation

## 2012-06-17 ENCOUNTER — Inpatient Hospital Stay (HOSPITAL_COMMUNITY): Payer: Medicare Other

## 2012-06-17 ENCOUNTER — Encounter (HOSPITAL_COMMUNITY): Payer: Self-pay | Admitting: Thoracic Surgery (Cardiothoracic Vascular Surgery)

## 2012-06-17 DIAGNOSIS — J438 Other emphysema: Secondary | ICD-10-CM

## 2012-06-17 DIAGNOSIS — C3490 Malignant neoplasm of unspecified part of unspecified bronchus or lung: Secondary | ICD-10-CM

## 2012-06-17 DIAGNOSIS — E1129 Type 2 diabetes mellitus with other diabetic kidney complication: Secondary | ICD-10-CM | POA: Diagnosis present

## 2012-06-17 DIAGNOSIS — J439 Emphysema, unspecified: Secondary | ICD-10-CM | POA: Diagnosis present

## 2012-06-17 LAB — CBC
HCT: 32.1 % — ABNORMAL LOW (ref 39.0–52.0)
Platelets: 174 10*3/uL (ref 150–400)
RBC: 3.73 MIL/uL — ABNORMAL LOW (ref 4.22–5.81)
RDW: 12.5 % (ref 11.5–15.5)
WBC: 10.7 10*3/uL — ABNORMAL HIGH (ref 4.0–10.5)

## 2012-06-17 LAB — GLUCOSE, CAPILLARY
Glucose-Capillary: 138 mg/dL — ABNORMAL HIGH (ref 70–99)
Glucose-Capillary: 190 mg/dL — ABNORMAL HIGH (ref 70–99)

## 2012-06-17 LAB — POCT I-STAT 3, ART BLOOD GAS (G3+)
pCO2 arterial: 43 mmHg (ref 35.0–45.0)
pH, Arterial: 7.381 (ref 7.350–7.450)
pO2, Arterial: 68 mmHg — ABNORMAL LOW (ref 80.0–100.0)

## 2012-06-17 LAB — BASIC METABOLIC PANEL
BUN: 24 mg/dL — ABNORMAL HIGH (ref 6–23)
CO2: 26 mEq/L (ref 19–32)
Chloride: 102 mEq/L (ref 96–112)
GFR calc Af Amer: 67 mL/min — ABNORMAL LOW (ref >90)
Potassium: 4.5 mEq/L (ref 3.5–5.1)

## 2012-06-17 MED ORDER — INSULIN ASPART 100 UNIT/ML ~~LOC~~ SOLN
0.0000 [IU] | Freq: Three times a day (TID) | SUBCUTANEOUS | Status: DC
  Administered 2012-06-17: 3 [IU] via SUBCUTANEOUS
  Administered 2012-06-18 (×2): 2 [IU] via SUBCUTANEOUS
  Administered 2012-06-18 – 2012-06-19 (×2): 3 [IU] via SUBCUTANEOUS
  Administered 2012-06-19 – 2012-06-20 (×3): 2 [IU] via SUBCUTANEOUS
  Administered 2012-06-20: 3 [IU] via SUBCUTANEOUS
  Administered 2012-06-20 – 2012-06-21 (×2): 2 [IU] via SUBCUTANEOUS
  Administered 2012-06-21: 3 [IU] via SUBCUTANEOUS
  Administered 2012-06-21: 2 [IU] via SUBCUTANEOUS
  Administered 2012-06-21: 3 [IU] via SUBCUTANEOUS
  Administered 2012-06-22 (×3): 2 [IU] via SUBCUTANEOUS
  Administered 2012-06-22: 3 [IU] via SUBCUTANEOUS
  Administered 2012-06-23: 2 [IU] via SUBCUTANEOUS

## 2012-06-17 MED ORDER — ACETAMINOPHEN 325 MG PO TABS
650.0000 mg | ORAL_TABLET | Freq: Four times a day (QID) | ORAL | Status: AC
  Administered 2012-06-17 – 2012-06-22 (×17): 650 mg via ORAL
  Filled 2012-06-17 (×16): qty 2

## 2012-06-17 MED ORDER — ENOXAPARIN SODIUM 30 MG/0.3ML ~~LOC~~ SOLN
40.0000 mg | SUBCUTANEOUS | Status: DC
  Administered 2012-06-17: 40 mg via SUBCUTANEOUS
  Filled 2012-06-17 (×3): qty 0.4

## 2012-06-17 NOTE — Op Note (Signed)
NAMESHONTEZ, SERMON NO.:  [id number]  MEDICAL RECORD NO.:  [id number]  LOCATION:  2306                         FACILITY:  [facility]  PHYSICIAN:  Salvatore Decent. Dorris Fetch, M.D.DATE OF BIRTH:  [birth year]  DATE OF PROCEDURE:  06/16/2012 DATE OF DISCHARGE:                              OPERATIVE REPORT   PREOPERATIVE DIAGNOSIS:  Mass, superior segment, left lower lobe.  POSTOPERATIVE DIAGNOSIS:  Stage IA non-small-cell lung cancer.  PROCEDURE:  Left thoracoscopic superior segmentectomy of left lower lobe, lymph node dissection.  SURGEON:  Salvatore Decent. Dorris Fetch, M.D.  ASSISTANT:  Doree Fudge, PA  ANESTHESIA:  General.  FINDINGS:  A 2-cm mass in superior segment of left lower lobe.  Incomplete fissure, two separate pulmonary arterial branches to the superior segment, margins clear, mass positive for non-small-cell carcinoma on frozen.  CLINICAL NOTE:  Mr. Blodgett is an 79 year old gentleman with a history of tobacco abuse in the remote past, and significant COPD.  He recently developed persistent cough.  Chest x-ray showed a left lower lobe lung mass.  CT confirmed the mass and the PET was done, which showed the lesion to be hypermetabolic with no evidence of regional or distant metastases.  He was not felt to be a candidate for a lobectomy due to COPD, but was a candidate for a more limited resection and was recommended that he undergo left VATS and superior segmentectomy.  The indications, risks, benefits, and alternatives were discussed in detail with the patient.  He was seen by cardiology and had a normal stress test, and was felt to be a candidate for surgery. He accepted the risks of surgery and agreed to proceed.  OPERATIVE NOTE:  Mr. Grafton was brought to the preoperative holding area on June 16, 2012.  There, Anesthesia placed a central line and arterial blood pressure monitoring line.  He was taken to the operating Room.  Intravenous antibiotics  were administered.  He was anesthetized and intubated with a double-lumen endotracheal tube.  PAS hose were placed for DVT prophylaxis.  Foley catheter was placed.  He was placed in a right lateral decubitus position and the left chest was prepped and draped in usual sterile fashion.  Single lung ventilation of the right lung was carried out and was tolerated well throughout the procedure.  An incision was made in approximately the seventh intercostal space in the midaxillary line and was carried through the skin and subcutaneous tissue.  The chest was entered bluntly using a hemostat, a port was inserted and the thoracoscope was placed in the chest.  There was significant air trapping and significant emphysema noted.  The fissure was relatively incomplete.  A small utility incision was made more anteriorly in the fifth intercostal space. The lung was grasped and the inferior pulmonary ligament was divided with electrocautery up to the level of the inferior pulmonary vein.  Nodes that were encountered were taken and sent as separate specimens.  The pleural reflection was divided medially and then laterally.  The mass was palpated in the superior segment of the left lower lobe.  The dissection was started in the fissure, but it was noted that the fissure was relatively incomplete, and it  was difficult to identify the pulmonary artery in the fissure. The AP window was opened and level 5 node was taken as a separate specimen.  Dissection then was taken back more medially and the major fissure was completed between the lingula and the left lower lobe up to the hilum with sequential firings of an Endo-GIA stapler.  The pulmonary artery branches to the lingula and the basilar segments of the lower lobe then were identified.  The pulmonary artery was dissected out at this point and the dissection was carried out anterior to the pulmonary artery and the fissure was divided with sequential firings  of the stapler.  After completing this, the superior segmental arterial branch was noted and was dissected out and divided with an endoscopic GIA stapler with a tan cartridge.  After this was done, it was clear that there was a second superior segmental branch more posterior and inferior to the initial one that was noted.  This was likewise encircled and divided with endoscopic GIA vascular stapler.  Next, the superior segmental bronchus was identified, dissected out, and divided.  The superior segmental vein was taken at the same time of the bronchus.  The resection was then completed with sequential firings of an endoscopic GIA stapler using the black cartridges for thicker areas.  The specimen was placed into an endoscopic retrieval bag and removed through the incision and sent for frozen section.  The mass turned out to be non-small-cell carcinoma with negative margins.  An attempt was made to identify nodes in the subcarinal space, but there were no readily identifiable nodes in that area.  Given his advanced age and negative PET CT, it was not felt prudent to do extensive dissection in the area.  An On-Q local anesthetic catheter was passed through a separate stab incision posteriorly and directed into a subpleural location.  0.5% Marcaine was used to locally anesthetize the area.  The catheter was secured with a 3-0 silk suture at the exit site from the skin.  ProGEL was applied to the staple lines.  A 36-French chest tube was placed posteriorly and a 28-French chest tube was placed anteriorly.  Both were secured with #1 silk sutures.  The lung was reinflated and there was good reinflation of all remaining lower lobe segments as well as the upper lobe. The incision was closed with #1 Vicryl fascial suture followed by 2-0 Vicryl subcutaneous suture and a 3-0 Vicryl subcuticular suture.  All sponge, needle, and instrument counts at the end of the procedure.  The patient was taken  from the operating room to the postanesthetic care unit, extubated in good condition.     Salvatore Decent Dorris Fetch, M.D.     SCH/MEDQ  D:  06/16/2012  T:  06/17/2012  Job:  [id number]

## 2012-06-17 NOTE — Consult Note (Addendum)
PULMONARY  / CRITICAL CARE MEDICINE  Name: Benjamin Patel MRN: [id number] DOB: 02-25-1927    ADMISSION DATE:  06/16/2012 CONSULTATION DATE:  06/17/2012   REFERRING MD :  Dorris Fetch PRIMARY SERVICE:  TCTS  CHIEF COMPLAINT:  S/p L lower lobe segmentectomy  BRIEF PATIENT DESCRIPTION: 79 y.o.WM with mild Copd found to have nodule on LLL sup segment s/p VATS with LLL segmentectomy 6/2.  Postop doing well .  No smoking since 1978.  Seen preop by MWert. PMHx with DM2, CKD, ASPVD: carotid , Copd/asthma, HL, HTN Preop PFTs:  FeV1 36%  TLC 97%  RV 177%  DLCO 34%.    SIGNIFICANT EVENTS / STUDIES:  6/2 VATS L  LLL segmentectomy superior segment  LINES / TUBES: CT 6/2 IJ CVL R 6/2  Rad art lin 6/2  CULTURES: none  ANTIBIOTICS: none    PAST MEDICAL HISTORY :  Past Medical History  Diagnosis Date  . Mixed hyperlipidemia     takes Pravastatin daily  . Gout     no meds required  . Skin cancer   . Abnormal prostate exam   . Carotid stenosis   . Cholelithiasis   . Renal insufficiency   . Renal artery stenosis     right  . PAC (premature atrial contraction)   . PVCs (premature ventricular contractions)   . Essential hypertension, benign     takes Clonodine,Losartan,and Metoprolol and HCTZ  . Carotid stenosis right side    Dr.Early does carotid ultrasounds yrly-last one Mar 14-report in epic  . Asthma     as a child  . COPD (chronic obstructive pulmonary disease)     slight   . History of bronchitis   . Pneumonia 02/08/2012  . History of colon polyps   . Enlarged prostate     slightly   . Type II or unspecified type diabetes mellitus without mention of complication, not stated as uncontrolled     takes Metformin daily  . History of shingles    Past Surgical History  Procedure Laterality Date  . No past surgeries    . Colonoscopy     Prior to Admission medications   Medication Sig Start Date End Date Taking? Authorizing Provider  aspirin EC 81 MG tablet Take 81 mg by  mouth every evening.   Yes Historical Provider, MD  cloNIDine (CATAPRES) 0.2 MG tablet Take 0.2 mg by mouth 2 (two) times daily.   Yes Historical Provider, MD  hydrochlorothiazide (HYDRODIURIL) 25 MG tablet Take 25 mg by mouth daily.   Yes Historical Provider, MD  losartan (COZAAR) 50 MG tablet Take 50 mg by mouth daily.   Yes Historical Provider, MD  metFORMIN (GLUCOPHAGE) 500 MG tablet Take 500 mg by mouth 2 (two) times daily with a meal.   Yes Historical Provider, MD  metoprolol succinate (TOPROL-XL) 100 MG 24 hr tablet Take 100 mg by mouth daily. Take with or immediately following a meal.   Yes Historical Provider, MD  niacin (NIASPAN) 500 MG CR tablet Take 500 mg by mouth at bedtime.   Yes Historical Provider, MD  pravastatin (PRAVACHOL) 20 MG tablet Take 20 mg by mouth every evening.   Yes Historical Provider, MD  albuterol (PROVENTIL HFA;VENTOLIN HFA) 108 (90 BASE) MCG/ACT inhaler Inhale 1 puff into the lungs every 4 (four) hours as needed for wheezing or shortness of breath (and cough). 02/12/12   Collene Gobble, MD   Allergies  Allergen Reactions  . Ace Inhibitors Cough  . Codeine Other (  See Comments)    GI Upset  . Indomethacin Other (See Comments)    Elevated BP    FAMILY HISTORY:  Family History  Problem Relation Age of Onset  . Heart disease Mother   . Cancer Father    SOCIAL HISTORY:  reports that he quit smoking about 36 years ago. His smoking use included Cigarettes. He has a 45 pack-year smoking history. He has never used smokeless tobacco. He reports that he does not drink alcohol or use illicit drugs.  REVIEW OF SYSTEMS:   Positive in BOLD Constitutional: Negative for fever, chills, weight loss, malaise/fatigue and diaphoresis.  HENT: Negative for hearing loss, ear pain, nosebleeds, congestion, sore throat, neck pain, tinnitus and ear discharge.   Eyes: Negative for blurred vision, double vision, photophobia, pain, discharge and redness.  Respiratory:  cough,  hemoptysis, sputum production, shortness of breath, wheezing and stridor.   Cardiovascular:  chest pain(post op), palpitations, orthopnea, claudication, leg swelling and PND.  Gastrointestinal: Negative for heartburn, nausea, vomiting, abdominal pain, diarrhea, constipation, blood in stool and melena.  Genitourinary: Negative for dysuria, urgency, frequency, hematuria and flank pain.  Musculoskeletal: Negative for myalgias, back pain, joint pain and falls.  Skin: Negative for itching and rash.  Neurological: Negative for dizziness, tingling, tremors, sensory change, speech change, focal weakness, seizures, loss of consciousness, weakness and headaches.  Endo/Heme/Allergies: Negative for environmental allergies and polydipsia. Does not bruise/bleed easily.  SUBJECTIVE:  Doing well postop VITAL SIGNS: Temp:  [97.4 F (36.3 C)-98.5 F (36.9 C)] 98.3 F (36.8 C) (06/03 0738) Pulse Rate:  [64-86] 73 (06/03 0800) Resp:  [10-20] 15 (06/03 0800) BP: (93-169)/(38-75) 103/42 mmHg (06/03 0800) SpO2:  [89 %-100 %] 100 % (06/03 0800) Arterial Line BP: (86-200)/(33-68) 110/36 mmHg (06/03 0800) FiO2 (%):  [96 %] 96 % (06/02 1519) Weight:  [74.3 kg (163 lb 12.8 oz)] 74.3 kg (163 lb 12.8 oz) (06/03 0500)  PHYSICAL EXAMINATION: General:  In no distress on [place] oxygen Neuro:  Awake and alert, moves all 4s HEENT:  Moist mucus membranes, no jvd or tmg, R IJ CVL Cardiovascular:  RRR nl s1 s2 no s3 s4  Lungs:  Distant BS  Abdomen:  Soft nt  bsa  Musculoskeletal:  From, no joint deformity Skin:  clear   Recent Labs Lab 06/12/12 0929 06/17/12 0430  NA 136 137  K 4.2 4.5  CL 100 102  CO2 22 26  BUN 28* 24*  CREATININE 1.06 1.14  GLUCOSE 124* 128*    Recent Labs Lab 06/12/12 0929 06/17/12 0430  HGB 13.9 11.1*  HCT 39.5 32.1*  WBC 7.0 10.7*  PLT 200 174   Dg Chest 2 View  06/16/2012   *RADIOLOGY REPORT*  Clinical Data: Preoperative chest radiograph for lung mass.  CHEST - 2 VIEW   Comparison: Chest radiograph performed 04/21/2012, and PET / CT performed 05/08/2012  Findings: A 2.1 cm mass is again noted at the left lower lobe.  The lungs are otherwise grossly clear.  No pleural effusion or pneumothorax is seen.  The heart is normal in size.  Calcification is noted in the aortic arch.  No acute osseous abnormalities are identified.  IMPRESSION: 2.1 cm left lower lobe lung mass again noted; lungs otherwise grossly clear.   Original Report Authenticated By: Tonia Ghent, M.D.   Dg Chest Portable 1 View  06/16/2012   *RADIOLOGY REPORT*  Clinical Data: Status post left VATS.  PORTABLE CHEST - 1 VIEW  Comparison: PA and lateral chest 60 14.  Findings: The patient has two new left chest tubes after VATS.  No pneumothorax is identified.  Left lower lobe nodule seen on the prior study is no longer visualized.  There is volume loss in the left chest.  Right lung is expanded and clear.  Right IJ catheter tip projects over the lower superior vena cava.  IMPRESSION:  1.  Status post VATS in the left.  Negative for pneumothorax with two chest tubes in place. 2.  Right IJ catheter tip projects over the lower superior vena cava.   Original Report Authenticated By: Holley Dexter, M.D.    ASSESSMENT / PLAN: Principal Problem:   Non-small cell carcinoma of lung, stage 1 Active Problems:   Essential hypertension, benign   DM (diabetes mellitus), type 2 with peripheral vascular complications   Renal insufficiency   Emphysematous COPD, Gold C   DM (diabetes mellitus) type II controlled with renal manifestation   #1. Hx of Copd Gold C/asthma s/p LLL segmentectomy VATS 06/16/12 of LLL nodule Non Small Cell CA Stage IA .   Post op doing well. CXR stable. Pulm status stable postop. Note pt not on inhaled meds preop. Note pt with FeV1 36% preop c/w Gold C Copd.  Centrilobular emphysema on CT chest preop. . Pt only on albuterol prn preop.  #2 DM2 with ASPVD and CKD GFR 67 (vascular and renal  complications)  Plan:  1. Scheduled albuterol/atrovent neb meds  2. Flutter valve  3. IS, pulm toilet  4. prob will benefit from Spiriva upon d/c.  5. SSI   Dorcas Carrow [facility]  [phone]  Cell  [phone]  If no response or cell goes to voicemail, call beeper [phone]    06/17/2012, 9:03 AM

## 2012-06-17 NOTE — Progress Notes (Signed)
Feels well  Sitting up eating dinner  Pain well controlled  BP 108/51  Pulse 73  Temp(Src) 99 F (37.2 C) (Oral)  Resp 17  Ht 5' 8.11" (1.73 m)  Wt 163 lb 12.8 oz (74.3 kg)  BMI 24.83 kg/m2  SpO2 96%  Minimal air leak  Doing well early postop

## 2012-06-17 NOTE — Progress Notes (Signed)
1 Day Post-Op Procedure(s) (LRB): VIDEO ASSISTED THORACOSCOPY (Left) left lower lobe superior SEGMENTECTOMY with node dissection (Left) Subjective: Some incisional discomfort but overall feels well  Objective: Vital signs in last 24 hours: Temp:  [97.4 F (36.3 C)-98.5 F (36.9 C)] 98.3 F (36.8 C) (06/03 0738) Pulse Rate:  [64-86] 73 (06/03 0700) Cardiac Rhythm:  [-] Normal sinus rhythm (06/02 2300) Resp:  [10-20] 20 (06/03 0700) BP: (93-169)/(38-75) 93/42 mmHg (06/03 0700) SpO2:  [89 %-100 %] 96 % (06/03 0756) Arterial Line BP: (86-200)/(33-68) 99/33 mmHg (06/03 0700) FiO2 (%):  [96 %] 96 % (06/02 1519) Weight:  [163 lb 12.8 oz (74.3 kg)] 163 lb 12.8 oz (74.3 kg) (06/03 0500)  Hemodynamic parameters for last 24 hours:    Intake/Output from previous day: 06/02 0701 - 06/03 0700 In: 2764 [P.O.:60; I.V.:2604; IV Piggyback:100] Out: 2485 [Urine:1335; Blood:100; Chest Tube:1050] Intake/Output this shift:    General appearance: alert and no distress Neurologic: intact Heart: regular rate and rhythm Lungs: wheezes on left Abdomen: normal findings: soft, non-tender  Lab Results:  Recent Labs  06/17/12 0430  WBC 10.7*  HGB 11.1*  HCT 32.1*  PLT 174   BMET:  Recent Labs  06/17/12 0430  NA 137  K 4.5  CL 102  CO2 26  GLUCOSE 128*  BUN 24*  CREATININE 1.14  CALCIUM 8.8    PT/INR: No results found for this basename: LABPROT, INR,  in the last 72 hours ABG    Component Value Date/Time   PHART 7.381 06/17/2012 0448   HCO3 25.6* 06/17/2012 0448   TCO2 27 06/17/2012 0448   O2SAT 93.0 06/17/2012 0448   CBG (last 3)   Recent Labs  06/16/12 1945 06/17/12 0001 06/17/12 0256  GLUCAP 146* 91 124*    Assessment/Plan: S/P Procedure(s) (LRB): VIDEO ASSISTED THORACOSCOPY (Left) left lower lobe superior SEGMENTECTOMY with node dissection (Left) POD # 1 CV- stable  RESP- s/p segmentectomy- moderate air leak- keep CT to suction today  Nebs for COPD  RENAL- lytes,  creatinine OK  GI- good BS- liquids, advance as tolerated  DVT prophylaxis- SCD, add lovenox  Ambulate   LOS: 1 day    HENDRICKSON,STEVEN C 06/17/2012

## 2012-06-18 ENCOUNTER — Inpatient Hospital Stay (HOSPITAL_COMMUNITY): Payer: Medicare Other

## 2012-06-18 LAB — CBC
HCT: 33.5 % — ABNORMAL LOW (ref 39.0–52.0)
MCHC: 34 g/dL (ref 30.0–36.0)
MCV: 87.9 fL (ref 78.0–100.0)
RDW: 13 % (ref 11.5–15.5)
WBC: 14.8 10*3/uL — ABNORMAL HIGH (ref 4.0–10.5)

## 2012-06-18 LAB — COMPREHENSIVE METABOLIC PANEL
Albumin: 3.2 g/dL — ABNORMAL LOW (ref 3.5–5.2)
BUN: 31 mg/dL — ABNORMAL HIGH (ref 6–23)
Chloride: 105 mEq/L (ref 96–112)
Creatinine, Ser: 1.67 mg/dL — ABNORMAL HIGH (ref 0.50–1.35)
Total Bilirubin: 0.7 mg/dL (ref 0.3–1.2)

## 2012-06-18 LAB — GLUCOSE, CAPILLARY
Glucose-Capillary: 132 mg/dL — ABNORMAL HIGH (ref 70–99)
Glucose-Capillary: 107 mg/dL — ABNORMAL HIGH (ref 70–99)
Glucose-Capillary: 151 mg/dL — ABNORMAL HIGH (ref 70–99)

## 2012-06-18 MED ORDER — SODIUM CHLORIDE 0.45 % IV SOLN
INTRAVENOUS | Status: DC
  Administered 2012-06-18: 75 mL/h via INTRAVENOUS
  Administered 2012-06-19: 01:00:00 via INTRAVENOUS

## 2012-06-18 MED ORDER — INSULIN ASPART 100 UNIT/ML ~~LOC~~ SOLN
4.0000 [IU] | Freq: Three times a day (TID) | SUBCUTANEOUS | Status: DC
  Administered 2012-06-18 – 2012-06-19 (×5): 4 [IU] via SUBCUTANEOUS

## 2012-06-18 MED ORDER — METOPROLOL TARTRATE 25 MG PO TABS
25.0000 mg | ORAL_TABLET | Freq: Once | ORAL | Status: AC
  Administered 2012-06-18: 25 mg via ORAL
  Filled 2012-06-18 (×2): qty 1

## 2012-06-18 MED ORDER — ENOXAPARIN SODIUM 30 MG/0.3ML ~~LOC~~ SOLN
30.0000 mg | SUBCUTANEOUS | Status: DC
  Administered 2012-06-18 – 2012-06-23 (×6): 30 mg via SUBCUTANEOUS
  Filled 2012-06-18 (×6): qty 0.3

## 2012-06-18 MED ORDER — ENOXAPARIN SODIUM 40 MG/0.4ML ~~LOC~~ SOLN
40.0000 mg | SUBCUTANEOUS | Status: DC
  Filled 2012-06-18 (×2): qty 0.4

## 2012-06-18 NOTE — Progress Notes (Signed)
Patient ID: Benjamin Patel, male   DOB: 06-11-1927, 79 y.o.   MRN: [id number]  SICU Evening Rounds:  Hemodynamically stable tonight.  Up in chair. Urine output ok. CT output low.

## 2012-06-18 NOTE — Progress Notes (Signed)
2 Days Post-Op Procedure(s) (LRB): VIDEO ASSISTED THORACOSCOPY (Left) left lower lobe superior SEGMENTECTOMY with node dissection (Left) Subjective: Feels well, minimal pain No SOB or nausea  Objective: Vital signs in last 24 hours: Temp:  [97.6 F (36.4 C)-99 F (37.2 C)] 97.6 F (36.4 C) (06/04 0355) Pulse Rate:  [63-88] 71 (06/04 0700) Cardiac Rhythm:  [-] Normal sinus rhythm (06/04 0600) Resp:  [13-27] 15 (06/04 0700) BP: (80-135)/(35-62) 98/39 mmHg (06/04 0700) SpO2:  [93 %-100 %] 97 % (06/04 0700) Arterial Line BP: (98-171)/(34-60) 171/60 mmHg (06/03 1800) Weight:  [165 lb 12.6 oz (75.2 kg)] 165 lb 12.6 oz (75.2 kg) (06/04 0400)  Hemodynamic parameters for last 24 hours:    Intake/Output from previous day: 06/03 0701 - 06/04 0700 In: 2241 [P.O.:960; I.V.:1281] Out: 1220 [Urine:770; Chest Tube:450] Intake/Output this shift:    General appearance: alert and no distress Neurologic: intact Heart: regular rate and rhythm Lungs: diminished breath sounds bilaterally Abdomen: normal findings: soft, non-tender + air leak  Lab Results:  Recent Labs  06/17/12 0430 06/18/12 0340  WBC 10.7* 14.8*  HGB 11.1* 11.4*  HCT 32.1* 33.5*  PLT 174 187   BMET:  Recent Labs  06/17/12 0430 06/18/12 0340  NA 137 138  K 4.5 4.7  CL 102 105  CO2 26 26  GLUCOSE 128* 106*  BUN 24* 31*  CREATININE 1.14 1.67*  CALCIUM 8.8 9.3    PT/INR: No results found for this basename: LABPROT, INR,  in the last 72 hours ABG    Component Value Date/Time   PHART 7.381 06/17/2012 0448   HCO3 25.6* 06/17/2012 0448   TCO2 27 06/17/2012 0448   O2SAT 93.0 06/17/2012 0448   CBG (last 3)   Recent Labs  06/17/12 1151 06/17/12 1620 06/17/12 2144  GLUCAP 138* 144* 190*    Assessment/Plan: S/P Procedure(s) (LRB): VIDEO ASSISTED THORACOSCOPY (Left) left lower lobe superior SEGMENTECTOMY with node dissection (Left) POD # 2 LLL superior segmentectomy CV- BP a little low this AM- will dc  cozaar and clonidine for now  RESP- stable, no wheezing  + air leak, lung expanded on CXR  Will place CT to water seal and check another CXR in a couple of hours  RENAL- creatinine up today- no clear inciting factor- probably be due to hypotension in setting of renal artery stenosis  Adjust lovenox dose  Change IV to 1/2 NS and increase to 75  Keep foley to monitor UO  ENDO- CBG up- was on metformin preop- can't restart with renal issues  Will add meal coverage  Continue ambulation  Will keep in SICU to monitor BP and renal function   LOS: 2 days    Mazen Marcin C 06/18/2012

## 2012-06-18 NOTE — Telephone Encounter (Signed)
Will forward to MW as an FYI 

## 2012-06-18 NOTE — Clinical Documentation Improvement (Signed)
CKD DOCUMENTATION CLARIFICATION QUERY   THIS DOCUMENT IS NOT A PERMANENT PART OF THE MEDICAL RECORD  TO RESPOND TO THE THIS QUERY, FOLLOW THE INSTRUCTIONS BELOW:  1. If needed, update documentation for the patient's encounter via the notes activity.  2. Access this query again and click edit on the In [employer].  3. After updating, or not, click F2 to complete all highlighted (required) fields concerning your review. Select "additional documentation in the medical record" OR "no additional documentation provided".  4. Click Sign note button.  5. The deficiency will fall out of your In Basket *Please let us know if you are not able to complete this workflow by phone or e-mail (listed below).  Please update your documentation within the medical record to reflect your response to this query.                                                                                        06/18/12   Dear Dr.Kanesha Cadle:/Associates,  In a better effort to capture your patient's severity of illness, reflect appropriate length of stay and utilization of resources, a review of the patient medical record has revealed the following indicators.    Based on your clinical judgment, please clarify and document in a progress note and/or discharge summary the clinical condition associated with the following supporting information:  In responding to this query please exercise your independent judgment.  The fact that a query is asked, does not imply that any particular answer is desired or expected.   Possible Clinical Conditions?   _______CKD Stage III - GFR 30-59  _______CKD Stage IV - GFR 15-29  ____x___Acute Renal Insufficiency  _______Acute/Chronic Renal Insufficiency  _______Other condition  _______Cannot Clinically determine       Risk Factors: Patient with a history of Renal Insufficiency noted per H&P.    Diagnostics:  Lab:  Bun:  6/3:  24 Bun:  6/4:  31  Creat:  6/3:  1.14 Creat:  6/4: 1.67  GFR:  6/3:  58 GFR:  6/4:  37    You may use possible, probable, or suspect with inpatient documentation. possible, probable, suspected diagnoses MUST be documented at the time of discharge  Reviewed: additional documentation in the medical record   Thank You,  Marciano Sequin,  Clinical Documentation Specialist:  Phone: [phone]  Health Information Management [place]

## 2012-06-19 ENCOUNTER — Inpatient Hospital Stay (HOSPITAL_COMMUNITY): Payer: Medicare Other

## 2012-06-19 DIAGNOSIS — N182 Chronic kidney disease, stage 2 (mild): Secondary | ICD-10-CM | POA: Diagnosis present

## 2012-06-19 DIAGNOSIS — R222 Localized swelling, mass and lump, trunk: Secondary | ICD-10-CM

## 2012-06-19 LAB — BASIC METABOLIC PANEL
CO2: 25 mEq/L (ref 19–32)
Chloride: 100 mEq/L (ref 96–112)
Creatinine, Ser: 1.25 mg/dL (ref 0.50–1.35)
Potassium: 4.5 mEq/L (ref 3.5–5.1)

## 2012-06-19 LAB — GLUCOSE, CAPILLARY: Glucose-Capillary: 134 mg/dL — ABNORMAL HIGH (ref 70–99)

## 2012-06-19 MED ORDER — CLONIDINE HCL 0.2 MG PO TABS
0.2000 mg | ORAL_TABLET | Freq: Two times a day (BID) | ORAL | Status: DC
  Administered 2012-06-19 – 2012-06-23 (×8): 0.2 mg via ORAL
  Filled 2012-06-19 (×9): qty 1

## 2012-06-19 MED ORDER — MAGNESIUM HYDROXIDE 400 MG/5ML PO SUSP
30.0000 mL | Freq: Once | ORAL | Status: AC
  Administered 2012-06-19: 30 mL via ORAL
  Filled 2012-06-19: qty 30

## 2012-06-19 MED ORDER — TIOTROPIUM BROMIDE MONOHYDRATE 18 MCG IN CAPS
18.0000 ug | ORAL_CAPSULE | Freq: Every day | RESPIRATORY_TRACT | Status: DC
  Administered 2012-06-20 – 2012-06-23 (×4): 18 ug via RESPIRATORY_TRACT
  Filled 2012-06-19: qty 5

## 2012-06-19 MED ORDER — BISACODYL 10 MG RE SUPP: 10.0000 mg | Freq: Every day | RECTAL | Status: DC | PRN

## 2012-06-19 MED ORDER — TIOTROPIUM BROMIDE MONOHYDRATE 18 MCG IN CAPS: 18.0000 ug | ORAL_CAPSULE | Freq: Every day | RESPIRATORY_TRACT | Status: DC

## 2012-06-19 NOTE — Progress Notes (Signed)
3 Days Post-Op Procedure(s) (LRB): VIDEO ASSISTED THORACOSCOPY (Left) left lower lobe superior SEGMENTECTOMY with node dissection (Left) Subjective: No complaints this AM No BM since surgery  Objective: Vital signs in last 24 hours: Temp:  [97.1 F (36.2 C)-99.5 F (37.5 C)] 98.1 F (36.7 C) (06/05 0750) Pulse Rate:  [72-124] 91 (06/05 0700) Cardiac Rhythm:  [-] Normal sinus rhythm (06/05 0400) Resp:  [14-24] 16 (06/05 0800) BP: (99-168)/(41-104) 131/41 mmHg (06/05 0700) SpO2:  [93 %-99 %] 96 % (06/05 0800) Weight:  [168 lb 3.4 oz (76.3 kg)] 168 lb 3.4 oz (76.3 kg) (06/05 0600)  Hemodynamic parameters for last 24 hours:    Intake/Output from previous day: 06/04 0701 - 06/05 0700 In: 1303 [P.O.:390; I.V.:913] Out: 1270 [Urine:1070; Chest Tube:200] Intake/Output this shift:    General appearance: alert and no distress Neurologic: intact Heart: regular rate and rhythm Lungs: diminished breath sounds bilaterally minimal air leak  Lab Results:  Recent Labs  06/17/12 0430 06/18/12 0340  WBC 10.7* 14.8*  HGB 11.1* 11.4*  HCT 32.1* 33.5*  PLT 174 187   BMET:  Recent Labs  06/18/12 0340 06/19/12 0410  NA 138 134*  K 4.7 4.5  CL 105 100  CO2 26 25  GLUCOSE 106* 120*  BUN 31* 29*  CREATININE 1.67* 1.25  CALCIUM 9.3 9.3    PT/INR: No results found for this basename: LABPROT, INR,  in the last 72 hours ABG    Component Value Date/Time   PHART 7.381 06/17/2012 0448   HCO3 25.6* 06/17/2012 0448   TCO2 27 06/17/2012 0448   O2SAT 93.0 06/17/2012 0448   CBG (last 3)   Recent Labs  06/18/12 1739 06/18/12 2159 06/19/12 0752  GLUCAP 107* 151* 134*    Assessment/Plan: S/P Procedure(s) (LRB): VIDEO ASSISTED THORACOSCOPY (Left) left lower lobe superior SEGMENTECTOMY with node dissection (Left) Plan for transfer to step-down: see transfer orders CV- stable, BP better  Continue to hold cozaar and clonidine for now  RESP- s/p segmentectomy for stage IA lung  cancer  Continue pulmonary hygiene  Air leak markedly decreased- will keep both tubes to suction today- may be able to dc one or both tomorrow  RENAL- acute renal insufficiency resolved- likely hypotension in setting of RAS  Dc foley  GI - no BM- will try MOM +/- dulcolax suppository  ENDO- CBG well controlled  Transfer to 3300 when bed available   LOS: 3 days    Ltanya Bayley C 06/19/2012

## 2012-06-19 NOTE — Progress Notes (Addendum)
PULMONARY  / CRITICAL CARE MEDICINE  Name: Benjamin Patel MRN: [id number] DOB: 06/09/1927    ADMISSION DATE:  06/16/2012 CONSULTATION DATE:  06/19/2012   REFERRING MD :  Dorris Fetch PRIMARY SERVICE:  T[facility]  CHIEF COMPLAINT:  S/p L lower lobe segmentectomy  BRIEF PATIENT DESCRIPTION: 79 y.o.WM with mild Copd found to have nodule on LLL sup segment s/p VATS with LLL segmentectomy 6/2.  Postop doing well .  No smoking since 1978.  Seen preop by MWert.  PMHx with DM2, CKD, ASPVD: carotid , Copd/asthma, HL, HTN Preop PFTs:  FeV1 36%  TLC 97%  RV 177%  DLCO 34%.    SIGNIFICANT EVENTS / STUDIES:  6/2 VATS L  LLL segmentectomy superior segment Path:  ve moderately differentiated Squamous cell carcinoma. + Lymph and vasc invasion  LINES / TUBES: CT 6/2 IJ CVL R 6/2  Rad art lin 6/2  CULTURES: none  ANTIBIOTICS: none  SUBJECTIVE:  Doing well postop VITAL SIGNS: Temp:  [97.1 F (36.2 C)-99.5 F (37.5 C)] 98.1 F (36.7 C) (06/05 0750) Pulse Rate:  [72-124] 91 (06/05 0700) Resp:  [14-24] 16 (06/05 0800) BP: (99-168)/(41-104) 131/41 mmHg (06/05 0700) SpO2:  [93 %-99 %] 96 % (06/05 0800) Weight:  [76.3 kg (168 lb 3.4 oz)] 76.3 kg (168 lb 3.4 oz) (06/05 0600)  PHYSICAL EXAMINATION: General:  In no distress on [place] oxygen Neuro:  Awake and alert, moves all 4s HEENT:  Moist mucus membranes, no jvd or tmg, R IJ CVL Cardiovascular:  RRR nl s1 s2 no s3 s4  Lungs:  Distant BS , + airleak 1/7 w/ cough  Abdomen:  Soft nt  bsa  Musculoskeletal:  From, no joint deformity Skin:  clear   Recent Labs Lab 06/17/12 0430 06/18/12 0340 06/19/12 0410  NA 137 138 134*  K 4.5 4.7 4.5  CL 102 105 100  CO2 26 26 25   BUN 24* 31* 29*  CREATININE 1.14 1.67* 1.25  GLUCOSE 128* 106* 120*    Recent Labs Lab 06/12/12 0929 06/17/12 0430 06/18/12 0340  HGB 13.9 11.1* 11.4*  HCT 39.5 32.1* 33.5*  WBC 7.0 10.7* 14.8*  PLT 200 174 187   Dg Chest Port 1 View  06/18/2012   *RADIOLOGY REPORT*   Clinical Data: Chest tube in place  PORTABLE CHEST - 1 VIEW  Comparison: 06/18/2012  Findings: There is a right IJ catheter with tip in the distal SVC.  There are two left-sided chest tubes in place.  Persistent left- sided pneumothorax which measures approximately 10%.  This is increased when compared with previous exam.  Consolidation and atelectasis in the left base is similar to previous exam.  IMPRESSION:  1.  Slight increase in volume of left-sided pneumothorax. 2.  No change in aeration to the left base.   Original Report Authenticated By: Signa Kell, M.D.   Dg Chest Port 1 View  06/18/2012   *RADIOLOGY REPORT*  Clinical Data: VATS  PORTABLE CHEST - 1 VIEW  Comparison: Yesterday  Findings: Stable left chest tubes.  Stable tiny left apical pneumothorax.  Bibasilar atelectasis left greater than right is stable on the right but worse on the left with increasing shift of the mediastinum.  Right internal jugular central venous catheter is stable.  IMPRESSION: Increasing volume loss in the left lower lobe.  Stable tiny left apical pneumothorax.   Original Report Authenticated By: Jolaine Click, M.D.    ASSESSMENT / PLAN: Principal Problem:   Non-small cell carcinoma of lung, stage 1 Active  Problems:   Essential hypertension, benign   DM (diabetes mellitus), type 2 with peripheral vascular complications   Renal insufficiency   Emphysematous COPD, Gold C   DM (diabetes mellitus) type II controlled with renal manifestation 6/5: still has sm apical PTX. LLL vol loss.   #1. Hx of Copd Gold C/asthma s/p LLL segmentectomy VATS 06/16/12 of LLL nodule Non Small Cell CA Stage IA .   Post op doing well. CXR stable. Pulm status stable postop. Note pt not on inhaled meds preop. Note pt with FeV1 36% preop c/w Gold C Copd.  Centrilobular emphysema on CT chest preop. . Pt only on albuterol prn preop.  Still has small post-op air leak.  #2 DM2 with ASPVD and CKD st II GFR 67 (vascular and renal  complications) Plan:  1. Change neb meds to Spiriva  2. Flutter valve  3. IS, pulm toilet  4. prob will benefit from Spiriva upon d/c.  5. SSI              6. CT rx per cvts  BABCOCK,PETE  06/19/2012, 8:30 AM  Pt seen and examined with NP and I agree with the above note . Dorcas Carrow Beeper  [phone]  Cell  [phone]  If no response or cell goes to voicemail, call beeper [phone]

## 2012-06-19 NOTE — Progress Notes (Signed)
Patient ID: DEE MADAY, male   DOB: 04-15-1927, 79 y.o.   MRN: [id number] EVENING ROUNDS NOTE :     [address]       [place] [address]             [phone]                 3 Days Post-Op Procedure(s) (LRB): VIDEO ASSISTED THORACOSCOPY (Left) left lower lobe superior SEGMENTECTOMY with node dissection (Left)  Total Length of Stay:  LOS: 3 days  BP 172/86  Pulse 105  Temp(Src) 97.9 F (36.6 C) (Oral)  Resp 22  Ht 5' 7.99" (1.727 m)  Wt 168 lb 3.4 oz (76.3 kg)  BMI 25.58 kg/m2  SpO2 95%  .Intake/Output     06/05 0701 - 06/06 0700   P.O. 360   I.V. (mL/kg) 314.8 (4.1)   Total Intake(mL/kg) 674.8 (8.8)   Urine (mL/kg/hr) 505 (0.5)   Chest Tube 160 (0.2)   Total Output 665   Net +9.8         . sodium chloride 10 mL/hr at 06/19/12 1800     Lab Results  Component Value Date   WBC 14.8* 06/18/2012   HGB 11.4* 06/18/2012   HCT 33.5* 06/18/2012   PLT 187 06/18/2012   GLUCOSE 120* 06/19/2012   CHOL 158 10/16/2011   TRIG 210* 10/16/2011   HDL 36* 10/16/2011   LDLCALC 80 10/16/2011   ALT 12 06/18/2012   AST 24 06/18/2012   NA 134* 06/19/2012   K 4.5 06/19/2012   CL 100 06/19/2012   CREATININE 1.25 06/19/2012   BUN 29* 06/19/2012   CO2 25 06/19/2012   INR 1.00 06/12/2012   HGBA1C 5.7 04/23/2012   BP now  Elevated, will added home meds  Delight Ovens MD  Beeper [phone] Office [phone] 06/19/2012 7:04 PM

## 2012-06-20 ENCOUNTER — Inpatient Hospital Stay (HOSPITAL_COMMUNITY): Payer: Medicare Other

## 2012-06-20 LAB — GLUCOSE, CAPILLARY
Glucose-Capillary: 120 mg/dL — ABNORMAL HIGH (ref 70–99)
Glucose-Capillary: 185 mg/dL — ABNORMAL HIGH (ref 70–99)
Glucose-Capillary: 123 mg/dL — ABNORMAL HIGH (ref 70–99)
Glucose-Capillary: 150 mg/dL — ABNORMAL HIGH (ref 70–99)

## 2012-06-20 MED ORDER — LACTULOSE 10 GM/15ML PO SOLN
30.0000 g | Freq: Once | ORAL | Status: AC
  Administered 2012-06-20: 30 g via ORAL
  Filled 2012-06-20: qty 45

## 2012-06-20 MED ORDER — FLEET ENEMA 7-19 GM/118ML RE ENEM
1.0000 | ENEMA | Freq: Every day | RECTAL | Status: DC | PRN
  Filled 2012-06-20: qty 1

## 2012-06-20 MED ORDER — LOSARTAN POTASSIUM 50 MG PO TABS
50.0000 mg | ORAL_TABLET | Freq: Every day | ORAL | Status: DC
  Administered 2012-06-20 – 2012-06-23 (×4): 50 mg via ORAL
  Filled 2012-06-20 (×4): qty 1

## 2012-06-20 NOTE — Progress Notes (Signed)
Patient arrived to 3301 from 2306 in wheel chair, all vital signs WNL, patient alert and oriented, chest tube intact. Patient's family at bedside.Will continue to monitor.

## 2012-06-20 NOTE — Progress Notes (Signed)
4 Days Post-Op Procedure(s) (LRB): VIDEO ASSISTED THORACOSCOPY (Left) left lower lobe superior SEGMENTECTOMY with node dissection (Left) Subjective: No complaints this AM Pain well controlled No BM yet  Objective: Vital signs in last 24 hours: Temp:  [97.4 F (36.3 C)-98.3 F (36.8 C)] 97.4 F (36.3 C) (06/06 0757) Pulse Rate:  [63-111] 88 (06/06 0600) Cardiac Rhythm:  [-] Normal sinus rhythm (06/05 2000) Resp:  [15-24] 15 (06/06 0600) BP: (97-194)/(42-101) 128/53 mmHg (06/06 0600) SpO2:  [95 %-100 %] 99 % (06/06 0804) Weight:  [164 lb 3.9 oz (74.5 kg)] 164 lb 3.9 oz (74.5 kg) (06/06 0600)  Hemodynamic parameters for last 24 hours:    Intake/Output from previous day: 06/05 0701 - 06/06 0700 In: 1057.4 [P.O.:600; I.V.:457.4] Out: 965 [Urine:805; Chest Tube:160] Intake/Output this shift:    General appearance: alert and no distress Neurologic: intact Heart: regular rate and rhythm Lungs: diminished breath sounds bilaterally and no wheezing Abdomen: normal findings: soft, non-tender Wound: clean and dry no air leak  Lab Results:  Recent Labs  06/18/12 0340  WBC 14.8*  HGB 11.4*  HCT 33.5*  PLT 187   BMET:  Recent Labs  06/18/12 0340 06/19/12 0410  NA 138 134*  K 4.7 4.5  CL 105 100  CO2 26 25  GLUCOSE 106* 120*  BUN 31* 29*  CREATININE 1.67* 1.25  CALCIUM 9.3 9.3    PT/INR: No results found for this basename: LABPROT, INR,  in the last 72 hours ABG    Component Value Date/Time   PHART 7.381 06/17/2012 0448   HCO3 25.6* 06/17/2012 0448   TCO2 27 06/17/2012 0448   O2SAT 93.0 06/17/2012 0448   CBG (last 3)   Recent Labs  06/19/12 1148 06/19/12 1737 06/19/12 2134  GLUCAP 159* 113* 145*    Assessment/Plan: S/P Procedure(s) (LRB): VIDEO ASSISTED THORACOSCOPY (Left) left lower lobe superior SEGMENTECTOMY with node dissection (Left) Plan for transfer to step-down: see transfer orders Still awaiting bed on 3300  CV- BP meds restarted last  PM  RESP- pulmonary hygiene  COPD- spiriva = nebs  No air leak this AM, minimal drainage- dc posterior CT, leave anterior CT to water seal  Hopefully can dc anterior tube tomorrow  RENAL- UO good, recheck BMET in AM  CBG- well controlled, continue CBG SSI  SCD + lovenox for DVT prophylaxis   LOS: 4 days    Ijeoma Loor C 06/20/2012

## 2012-06-20 NOTE — Progress Notes (Signed)
Report given to receiving RN on 3300, pt was transferred to 3301, all belongings were sent with the patient and were carried by family members to the new room. At time of transfer pt's vss.

## 2012-06-20 NOTE — Progress Notes (Addendum)
PULMONARY  / CRITICAL CARE MEDICINE  Name: Benjamin Patel MRN: [id number] DOB: [birth year]    ADMISSION DATE:  06/16/2012 CONSULTATION DATE:  06/20/2012   REFERRING MD :  Dorris Fetch PRIMARY SERVICE:  T[facility]  CHIEF COMPLAINT:  S/p L lower lobe segmentectomy  BRIEF PATIENT DESCRIPTION: 79 y.o.WM with mild Copd found to have nodule on LLL sup segment s/p VATS with LLL segmentectomy 6/2.  Postop doing well .  No smoking since 1978.  Seen preop by MWert.  PMHx with DM2, CKD, ASPVD: carotid , Copd/asthma, HL, HTN Preop PFTs:  FeV1 36%  TLC 97%  RV 177%  DLCO 34%.    SIGNIFICANT EVENTS / STUDIES:  6/2 VATS L  LLL segmentectomy superior segment Path:  ve moderately differentiated Squamous cell carcinoma. + Lymph and vasc invasion  LINES / TUBES: CT x 2 6/2>>6/6 Posterior tube removed>> IJ CVL R 6/2  Rad art lin 6/2>>out   CULTURES: none  ANTIBIOTICS: none  SUBJECTIVE:  Doing well postop, one CT being removed VITAL SIGNS: Temp:  [97.4 F (36.3 C)-98.3 F (36.8 C)] 97.4 F (36.3 C) (06/06 0757) Pulse Rate:  [63-113] 113 (06/06 0900) Resp:  [15-25] 25 (06/06 0900) BP: (97-194)/(42-86) 175/68 mmHg (06/06 0900) SpO2:  [94 %-100 %] 94 % (06/06 0900) FiO2 (%):  [0 %] 0 % (06/06 0800) Weight:  [74.5 kg (164 lb 3.9 oz)] 74.5 kg (164 lb 3.9 oz) (06/06 0600)  PHYSICAL EXAMINATION: General:  In no distress on [place] oxygen Neuro:  Awake and alert, moves all 4s HEENT:  Moist mucus membranes, no jvd or tmg, R IJ CVL Cardiovascular:  RRR nl s1 s2 no s3 s4  Lungs:  Distant BS ,CT still present x 2, one being removed now , small air leak Abdomen:  Soft nt  bsa  Musculoskeletal:  From, no joint deformity Skin:  clear   Recent Labs Lab 06/17/12 0430 06/18/12 0340 06/19/12 0410  NA 137 138 134*  K 4.5 4.7 4.5  CL 102 105 100  CO2 26 26 25   BUN 24* 31* 29*  CREATININE 1.14 1.67* 1.25  GLUCOSE 128* 106* 120*    Recent Labs Lab 06/17/12 0430 06/18/12 0340  HGB 11.1* 11.4*  HCT  32.1* 33.5*  WBC 10.7* 14.8*  PLT 174 187   Dg Chest Port 1 View  06/20/2012   *RADIOLOGY REPORT*  Clinical Data: Postop video assisted thoracoscopy left lower lobe superior segmentectomy.  PORTABLE CHEST - 1 VIEW  Comparison: 06/19/2012  Findings: Two left-sided chest tubes remain in place.  A small left apical pneumothorax is identified and is stable in size. Postoperative changes at the left lung base with residual atelectasis is noted and this finding is stable as well.  The right internal jugular CVP is stable in position. The patient is rotated slightly towards the left.  Taking this into consideration heart and mediastinal contours are stable.  The remainder of the lungs appear clear with no new infiltrate or atelectasis identified.  No pleural fluid is seen.  A small amount of persistent subcutaneous emphysema is seen along the left lateral lower chest wall.  IMPRESSION: Stable postoperative appearance with small residual left apical pneumothorax, left lower lobe volume loss and subcutaneous emphysema.  No new findings   Original Report Authenticated By: Rhodia Albright, M.D.   Dg Chest Port 1 View  06/19/2012   *RADIOLOGY REPORT*  Clinical Data: Postop.  PORTABLE CHEST - 1 VIEW  Comparison: 06/18/2012  Findings: Left chest tubes remain in place.  Decreasing size of the left pneumothorax with small residual left apical pneumothorax. Small amount of subcutaneous emphysema.  Left lower lobe atelectasis.  Right lung is clear.  IMPRESSION: Decreasing size of the left pneumothorax.  Small residual left apical pneumothorax.  Left lower lobe atelectasis.   Original Report Authenticated By: Charlett Nose, M.D.   Dg Chest Port 1 View  06/18/2012   *RADIOLOGY REPORT*  Clinical Data: Chest tube in place  PORTABLE CHEST - 1 VIEW  Comparison: 06/18/2012  Findings: There is a right IJ catheter with tip in the distal SVC.  There are two left-sided chest tubes in place.  Persistent left- sided pneumothorax which  measures approximately 10%.  This is increased when compared with previous exam.  Consolidation and atelectasis in the left base is similar to previous exam.  IMPRESSION:  1.  Slight increase in volume of left-sided pneumothorax. 2.  No change in aeration to the left base.   Original Report Authenticated By: Signa Kell, M.D.    ASSESSMENT / PLAN: Principal Problem:   Non-small cell carcinoma of lung, stage 1 Active Problems:   Essential hypertension, benign   DM (diabetes mellitus), type 2 with peripheral vascular complications   Renal insufficiency   Emphysematous COPD, Gold C   DM (diabetes mellitus) type II controlled with renal manifestation   CKD (chronic kidney disease) stage 2, GFR 60-89 ml/min 6/5: still has sm apical PTX. LLL vol loss.   #1. Hx of Copd Gold C/asthma s/p LLL segmentectomy VATS 06/16/12 of LLL nodule Non Small Cell CA Stage IA .   Post op doing well. CXR stable. Pulm status stable postop.  Cont spiriva and prn nebs [place] oxygen  PCCM will see prn this weekend. We will check on pt again Monday Call with ?s prn this w/e.  Dorcas Carrow Beeper  [phone]  Cell  [phone]  If no response or cell goes to voicemail, call beeper [phone]

## 2012-06-21 ENCOUNTER — Inpatient Hospital Stay (HOSPITAL_COMMUNITY): Payer: Medicare Other

## 2012-06-21 LAB — CK TOTAL AND CKMB (NOT AT ARMC)
CK, MB: 3.3 ng/mL (ref 0.3–4.0)
Total CK: 81 U/L (ref 7–232)

## 2012-06-21 LAB — GLUCOSE, CAPILLARY
Glucose-Capillary: 155 mg/dL — ABNORMAL HIGH (ref 70–99)
Glucose-Capillary: 133 mg/dL — ABNORMAL HIGH (ref 70–99)

## 2012-06-21 LAB — BASIC METABOLIC PANEL
Chloride: 98 mEq/L (ref 96–112)
GFR calc Af Amer: 73 mL/min — ABNORMAL LOW (ref >90)
Potassium: 3.9 mEq/L (ref 3.5–5.1)
Sodium: 136 mEq/L (ref 135–145)

## 2012-06-21 LAB — CBC
HCT: 29.7 % — ABNORMAL LOW (ref 39.0–52.0)
Platelets: 197 10*3/uL (ref 150–400)
RDW: 12.6 % (ref 11.5–15.5)
WBC: 6.7 10*3/uL (ref 4.0–10.5)

## 2012-06-21 MED ORDER — AMIODARONE HCL IN DEXTROSE 360-4.14 MG/200ML-% IV SOLN
60.0000 mg/h | INTRAVENOUS | Status: AC
  Administered 2012-06-21 (×2): 60 mg/h via INTRAVENOUS
  Filled 2012-06-21: qty 200

## 2012-06-21 MED ORDER — AMIODARONE LOAD VIA INFUSION
150.0000 mg | Freq: Once | INTRAVENOUS | Status: AC
  Administered 2012-06-21: 150 mg via INTRAVENOUS
  Filled 2012-06-21: qty 83.34

## 2012-06-21 MED ORDER — METOPROLOL TARTRATE 1 MG/ML IV SOLN
INTRAVENOUS | Status: AC
  Administered 2012-06-21: 5 mg
  Filled 2012-06-21: qty 5

## 2012-06-21 MED ORDER — AMIODARONE HCL IN DEXTROSE 360-4.14 MG/200ML-% IV SOLN
30.0000 mg/h | INTRAVENOUS | Status: DC
  Administered 2012-06-21: 30 mg/h via INTRAVENOUS
  Filled 2012-06-21 (×4): qty 200

## 2012-06-21 MED ORDER — SODIUM CHLORIDE 0.9 % IJ SOLN
INTRAMUSCULAR | Status: AC
  Administered 2012-06-21: 13:00:00
  Filled 2012-06-21: qty 20

## 2012-06-21 NOTE — Progress Notes (Addendum)
[facility] DAILY ICU PROGRESS NOTE                   [address]            [place] [address]          [phone]   5 Days Post-Op Procedure(s) (LRB): VIDEO ASSISTED THORACOSCOPY (Left) left lower lobe superior SEGMENTECTOMY with node dissection (Left)  Total Length of Stay:  LOS: 5 days   Subjective: Has gone into rapid afib, now on IV amiodarone. Has coughed up some blood tinged sputum  Objective: Vital signs in last 24 hours: Temp:  [97.5 F (36.4 C)-99 F (37.2 C)] 97.6 F (36.4 C) (06/07 0700) Pulse Rate:  [86-120] 120 (06/07 0934) Cardiac Rhythm:  [-] Normal sinus rhythm (06/07 0510) Resp:  [16-21] 17 (06/07 0800) BP: (86-151)/(40-71) 134/63 mmHg (06/07 0934) SpO2:  [93 %-98 %] 98 % (06/07 0805) FiO2 (%):  [0 %-97 %] 97 % (06/07 0800)  Filed Weights   06/18/12 0400 06/19/12 0600 06/20/12 0600  Weight: 165 lb 12.6 oz (75.2 kg) 168 lb 3.4 oz (76.3 kg) 164 lb 3.9 oz (74.5 kg)    Weight change:    Hemodynamic parameters for last 24 hours:    Intake/Output from previous day: 06/06 0701 - 06/07 0700 In: 1230 [P.O.:750; I.V.:480] Out: 850 [Urine:800; Chest Tube:50]  Intake/Output this shift:    Current Meds: Scheduled Meds: . acetaminophen  650 mg Oral Q6H  . aspirin EC  81 mg Oral QPM  . bisacodyl  10 mg Oral Daily  . cloNIDine  0.2 mg Oral BID  . enoxaparin (LOVENOX) injection  30 mg Subcutaneous Q24H  . fentaNYL   Intravenous Q4H  . insulin aspart  0-15 Units Subcutaneous TID AC & HS  . losartan  50 mg Oral Daily  . metoprolol succinate  50 mg Oral Daily  . niacin  500 mg Oral QHS  . pantoprazole  40 mg Oral Daily  . simvastatin  10 mg Oral q1800  . tiotropium  18 mcg Inhalation Daily   Continuous Infusions: . sodium chloride 20 mL/hr at 06/20/12 0700  . amiodarone (NEXTERONE PREMIX) 360 mg/200 mL dextrose 60 mg/hr (06/21/12 0919)   Followed by  . amiodarone (NEXTERONE PREMIX) 360 mg/200 mL dextrose     PRN Meds:.bisacodyl,  diphenhydrAMINE, diphenhydrAMINE, naloxone, ondansetron (ZOFRAN) IV, ondansetron (ZOFRAN) IV, potassium chloride, senna-docusate, sodium chloride, sodium phosphate, traMADol  General appearance: alert, cooperative and no distress Heart: irregularly irregular rhythm and tachy Lungs: fair air exchange throughout Abdomen: benign Extremities: + LE edema Wound: incisions dressed  Lab Results: CBC: Recent Labs  06/21/12 0400  WBC 6.7  HGB 10.2*  HCT 29.7*  PLT 197   BMET:  Recent Labs  06/19/12 0410 06/21/12 0400  NA 134* 136  K 4.5 3.9  CL 100 98  CO2 25 27  GLUCOSE 120* 134*  BUN 29* 24*  CREATININE 1.25 1.06  CALCIUM 9.3 9.5    PT/INR: No results found for this basename: LABPROT, INR,  in the last 72 hours Radiology: Dg Chest Port 1 View  06/21/2012   *RADIOLOGY REPORT*  Clinical Data: Status post segmentectomy  PORTABLE CHEST - 1 VIEW  Comparison: 06/20/2012  Findings: Stable tiny left apical pneumothorax.  Indwelling left apical chest tube.  Postsurgical changes in the left hemithorax.  Stable left lower lobe opacity, likely atelectasis.  Adjacent subcutaneous emphysema along the left lower chest wall.  Underlying chronic interstitial markings/emphysematous changes.  Mild cardiomegaly.  Stable right IJ venous catheter.  IMPRESSION: Stable tiny left apical pneumothorax.  Indwelling left apical chest tube.  Postsurgical changes in the left hemithorax.   Original Report Authenticated By: Charline Bills, M.D.   Dg Chest Port 1 View  06/20/2012   *RADIOLOGY REPORT*  Clinical Data: Removal of chest tube.  PORTABLE CHEST - 1 VIEW  Comparison: 06/20/2012 6:21 a.m.  Findings: One of the two left-sided chest tubes has been removed. Tiny left apical pneumothorax is of similar size (less than 5%). Question tiny pneumothorax left costophrenic angle region versus overlying subcutaneous emphysema.  Left base subsegmental atelectatic changes.  Right central line tip proximal superior vena cava  level.  No gross right-sided pneumothorax.  Heart size within normal limits.  Mild mediastinal shift to the left.  IMPRESSION: One of the two left-sided chest tubes has been removed.  Tiny left apical pneumothorax is of similar size (less than 5%).  Question tiny pneumothorax left costophrenic angle region versus overlying subcutaneous emphysema.  Left base subsegmental atelectatic changes.   Original Report Authenticated By: Lacy Duverney, M.D.   Dg Chest Port 1 View  06/20/2012   *RADIOLOGY REPORT*  Clinical Data: Postop video assisted thoracoscopy left lower lobe superior segmentectomy.  PORTABLE CHEST - 1 VIEW  Comparison: 06/19/2012  Findings: Two left-sided chest tubes remain in place.  A small left apical pneumothorax is identified and is stable in size. Postoperative changes at the left lung base with residual atelectasis is noted and this finding is stable as well.  The right internal jugular CVP is stable in position. The patient is rotated slightly towards the left.  Taking this into consideration heart and mediastinal contours are stable.  The remainder of the lungs appear clear with no new infiltrate or atelectasis identified.  No pleural fluid is seen.  A small amount of persistent subcutaneous emphysema is seen along the left lateral lower chest wall.  IMPRESSION: Stable postoperative appearance with small residual left apical pneumothorax, left lower lobe volume loss and subcutaneous emphysema.  No new findings   Original Report Authenticated By: Rhodia Albright, M.D.     Assessment/Plan: S/P Procedure(s) (LRB): VIDEO ASSISTED THORACOSCOPY (Left) left lower lobe superior SEGMENTECTOMY with node dissection (Left)  1. Afib with RVR, now on amio IV, will check TSH and CPK's 2 keep chest tube  , no air leak on H2O seal 3 cbg's adeq control 4 H/H slow drop, cont to monitor 5 push pulm toilet/rehab as able 6 renal fxn ok 7 systolic BP a little high, observe on current rx for now GOLD,WAYNE  E 06/21/2012 9:44 AM  DC chest tube, CXR looks good Cont IV amio until tomorrow and keep stepdown status

## 2012-06-21 NOTE — Progress Notes (Addendum)
Sitting in chair eating breakfast, heart rate up to 160 resp rate 20's, BP 163/65, no complaints.  States he feels ok.  Back to bed, 12 lead ekg done, shows rapid atrial fib.  Dr. Morton Peters notified and orders given and completed.  Continue to monitor.

## 2012-06-21 NOTE — Progress Notes (Signed)
Patient complaining of crushing chest pain 6/10. EKG collected. Reported as NSR. Will continue to monitor.  Rochele Pages, RN

## 2012-06-21 NOTE — Progress Notes (Signed)
  Amiodarone Drug - Drug Interaction Consult Note  Recommendations: No specific drug related recommendations at this time. Will continue to follow patient for addition of other medications that may interact with amiodarone.  Amiodarone is metabolized by the cytochrome P450 system and therefore has the potential to cause many drug interactions. Amiodarone has an average plasma half-life of 50 days (range 20 to 100 days).   There is potential for drug interactions to occur several weeks or months after stopping treatment and the onset of drug interactions may be slow after initiating amiodarone.   [x]  Statins: Increased risk of myopathy. Simvastatin- restrict dose to 20mg  daily. Other statins: counsel patients to report any muscle pain or weakness immediately.  []  Anticoagulants: Amiodarone can increase anticoagulant effect. Consider warfarin dose reduction. Patients should be monitored closely and the dose of anticoagulant altered accordingly, remembering that amiodarone levels take several weeks to stabilize.  []  Antiepileptics: Amiodarone can increase plasma concentration of phenytoin, the dose should be reduced. Note that small changes in phenytoin dose can result in large changes in levels. Monitor patient and counsel on signs of toxicity.  [x]  Beta blockers: increased risk of bradycardia, AV block and myocardial depression. Sotalol - avoid concomitant use.  []   Calcium channel blockers (diltiazem and verapamil): increased risk of bradycardia, AV block and myocardial depression.  []   Cyclosporine: Amiodarone increases levels of cyclosporine. Reduced dose of cyclosporine is recommended.  []  Digoxin dose should be halved when amiodarone is started.  []  Diuretics: increased risk of cardiotoxicity if hypokalemia occurs.  []  Oral hypoglycemic agents (glyburide, glipizide, glimepiride): increased risk of hypoglycemia. Patient's glucose levels should be monitored closely when initiating  amiodarone therapy.   []  Drugs that prolong the QT interval:  Torsades de pointes risk may be increased with concurrent use - avoid if possible.  Monitor QTc, also keep magnesium/potassium WNL if concurrent therapy can't be avoided. Marland Kitchen Antibiotics: e.g. fluoroquinolones, erythromycin. . Antiarrhythmics: e.g. quinidine, procainamide, disopyramide, sotalol. . Antipsychotics: e.g. phenothiazines, haloperidol.  . Lithium, tricyclic antidepressants, and methadone.  Thank You,  Fredrik Rigger  06/21/2012 8:53 AM

## 2012-06-21 NOTE — Progress Notes (Signed)
PULMONARY  / CRITICAL CARE MEDICINE  Name: Benjamin Patel MRN: [id number] DOB: 07-25-1927    ADMISSION DATE:  06/16/2012   REFERRING MD :  Dorris Fetch PRIMARY SERVICE:  [facility]  CHIEF COMPLAINT:  S/p L lower lobe segmentectomy  BRIEF PATIENT DESCRIPTION: 79 y.o.WM with mild Copd found to have nodule on LLL sup segment s/p VATS with LLL segmentectomy 6/2.  Postop doing well .  No smoking since 1978.  Seen preop by MWert.  PMHx with DM2, CKD, ASPVD: carotid , Copd/asthma, HL, HTN Preop PFTs:  FeV1 36%  TLC 97%  RV 177%  DLCO 34%.    SIGNIFICANT EVENTS / STUDIES:  6/2 VATS L  LLL segmentectomy superior segment Path:  ve moderately differentiated Squamous cell carcinoma. + Lymph and vasc invasion 6/7 am  RAF > amio started by T surgery 6/7  LINES / TUBES: CT x 2 6/2>>6/6 Posterior tube removed>> IJ CVL R 6/2  Rad art lin 6/2>>out   CULTURES: none  ANTIBIOTICS: none  SUBJECTIVE:  Worse overnight with increase would pain and RAF  VITAL SIGNS: Temp:  [97.5 F (36.4 C)-99 F (37.2 C)] 97.6 F (36.4 C) (06/07 0700) Pulse Rate:  [86-147] 136 (06/07 1015) Resp:  [14-24] 18 (06/07 1015) BP: (86-177)/(40-85) 107/55 mmHg (06/07 1015) SpO2:  [93 %-99 %] 98 % (06/07 1015) FiO2 (%):  [0 %-97 %] 97 % (06/07 0800)  PHYSICAL EXAMINATION: General:  In no distress on [place] oxygen Neuro:  Awake and alert, moves all 4s HEENT:  Moist mucus membranes, no jvd or tmg, R IJ CVL Cardiovascular:  IRR at 140  nl s1 s2 no s3 s4  Lungs:  Distant BS ,CT still present x 2, one being removed now , small air leak Abdomen:  Soft nt  bsa  Musculoskeletal:  From, no joint deformity Skin:  clear   Recent Labs Lab 06/18/12 0340 06/19/12 0410 06/21/12 0400  NA 138 134* 136  K 4.7 4.5 3.9  CL 105 100 98  CO2 26 25 27   BUN 31* 29* 24*  CREATININE 1.67* 1.25 1.06  GLUCOSE 106* 120* 134*    Recent Labs Lab 06/17/12 0430 06/18/12 0340 06/21/12 0400  HGB 11.1* 11.4* 10.2*  HCT 32.1* 33.5* 29.7*   WBC 10.7* 14.8* 6.7  PLT 174 187 197   pcxr 6/7  Stable tiny left apical pneumothorax. Indwelling left apical chest  tube.  Postsurgical changes in the left hemithorax.    ASSESSMENT / PLAN: Principal Problem:   Non-small cell carcinoma of lung, stage 1 Active Problems:   Essential hypertension, benign   DM (diabetes mellitus), type 2 with peripheral vascular complications   Renal insufficiency   Emphysematous COPD, Gold C   DM (diabetes mellitus) type II controlled with renal manifestation   CKD (chronic kidney disease) stage 2, GFR 60-89 ml/min    #1. Hx of Copd GOLD III s/p LLL segmentectomy VATS 06/16/12 of LLL nodule Non Small Cell CA Stage IA .   Post op doing well. CXR stable. Pulm status stable postop.  Cont spiriva . No need for saba Wean oxygen as to     #2) Rapid afib - Skaines is cardiologist - TSH 6/7 >>> rx per T surgery       Sandrea Hughs, MD Pulmonary and Critical Care Medicine [facility] Cell [phone] After 5:30 PM or weekends, call [phone]

## 2012-06-21 NOTE — Progress Notes (Signed)
Patient converted to NSR 71, No complaints resting in high fowlers position.  Continue to watch.

## 2012-06-22 ENCOUNTER — Inpatient Hospital Stay (HOSPITAL_COMMUNITY): Payer: Medicare Other

## 2012-06-22 LAB — GLUCOSE, CAPILLARY: Glucose-Capillary: 130 mg/dL — ABNORMAL HIGH (ref 70–99)

## 2012-06-22 LAB — BASIC METABOLIC PANEL
BUN: 19 mg/dL (ref 6–23)
CO2: 24 mEq/L (ref 19–32)
Glucose, Bld: 127 mg/dL — ABNORMAL HIGH (ref 70–99)
Potassium: 3.9 mEq/L (ref 3.5–5.1)
Sodium: 136 mEq/L (ref 135–145)

## 2012-06-22 MED ORDER — AMIODARONE HCL 200 MG PO TABS
200.0000 mg | ORAL_TABLET | Freq: Two times a day (BID) | ORAL | Status: DC
  Administered 2012-06-22 – 2012-06-23 (×3): 200 mg via ORAL
  Filled 2012-06-22 (×4): qty 1

## 2012-06-22 MED ORDER — OXYCODONE-ACETAMINOPHEN 5-325 MG PO TABS: 1.0000 | ORAL_TABLET | ORAL | Status: DC | PRN

## 2012-06-22 MED ORDER — TRAMADOL HCL 50 MG PO TABS
50.0000 mg | ORAL_TABLET | Freq: Four times a day (QID) | ORAL | Status: DC | PRN
  Administered 2012-06-22: 50 mg via ORAL

## 2012-06-22 MED ORDER — HYDROCHLOROTHIAZIDE 25 MG PO TABS
25.0000 mg | ORAL_TABLET | Freq: Every day | ORAL | Status: DC
  Administered 2012-06-22 – 2012-06-23 (×2): 25 mg via ORAL
  Filled 2012-06-22 (×2): qty 1

## 2012-06-22 NOTE — Progress Notes (Signed)
Called to patients room Right IJ leaking and seem to have been dislodged from secernment device although divice was still sutured in. fluids  Stopped explained to pt procedure for removal of central line, line removed  Per protocol pt tolerated well, pt remained flat for 30 min per policy VSS with no obvious signs of distress.Pt had previously schuleded chest  Xray for this am   Piv placed  Via IV team. Checked with Veronda Rph fentanyl and amiodarone compatible Dr. Kendrick Fries of [facility] made aware during his rounds.

## 2012-06-22 NOTE — Progress Notes (Addendum)
[facility] DAILY ICU PROGRESS NOTE                   [address]            [place] [address]          [phone]   6 Days Post-Op Procedure(s) (LRB): VIDEO ASSISTED THORACOSCOPY (Left) left lower lobe superior SEGMENTECTOMY with node dissection (Left)  Total Length of Stay:  LOS: 6 days   Subjective: Afib has converted to sinus rhythm. He conts to feel stronger  Objective: Vital signs in last 24 hours: Temp:  [97.4 F (36.3 C)-98.6 F (37 C)] 98.6 F (37 C) (06/08 0340) Pulse Rate:  [65-147] 92 (06/08 0340) Cardiac Rhythm:  [-] Normal sinus rhythm (06/08 0340) Resp:  [14-22] 19 (06/08 0754) BP: (102-177)/(43-85) 165/56 mmHg (06/08 0340) SpO2:  [93 %-100 %] 98 % (06/08 0754) FiO2 (%):  [95 %] 95 % (06/07 1509)  Filed Weights   06/18/12 0400 06/19/12 0600 06/20/12 0600  Weight: 165 lb 12.6 oz (75.2 kg) 168 lb 3.4 oz (76.3 kg) 164 lb 3.9 oz (74.5 kg)    Weight change:    Hemodynamic parameters for last 24 hours:    Intake/Output from previous day: 06/07 0701 - 06/08 0700 In: 1867.8 [P.O.:720; I.V.:1147.8] Out: 1300 [Urine:1300]  Intake/Output this shift:    Current Meds: Scheduled Meds: . acetaminophen  650 mg Oral Q6H  . aspirin EC  81 mg Oral QPM  . bisacodyl  10 mg Oral Daily  . cloNIDine  0.2 mg Oral BID  . enoxaparin (LOVENOX) injection  30 mg Subcutaneous Q24H  . fentaNYL   Intravenous Q4H  . insulin aspart  0-15 Units Subcutaneous TID AC & HS  . losartan  50 mg Oral Daily  . metoprolol succinate  50 mg Oral Daily  . niacin  500 mg Oral QHS  . pantoprazole  40 mg Oral Daily  . simvastatin  10 mg Oral q1800  . tiotropium  18 mcg Inhalation Daily   Continuous Infusions: . sodium chloride 20 mL/hr at 06/20/12 0700  . amiodarone (NEXTERONE PREMIX) 360 mg/200 mL dextrose 30 mg/hr (06/21/12 1800)   PRN Meds:.bisacodyl, diphenhydrAMINE, diphenhydrAMINE, naloxone, ondansetron (ZOFRAN) IV, ondansetron (ZOFRAN) IV, potassium chloride,  senna-docusate, sodium chloride, sodium phosphate, traMADol  General appearance: alert, cooperative and no distress Heart: regular rate and rhythm Lungs: dim in bases Abdomen: soft, nontender Extremities: mild edema Wound: incis healing well  Lab Results: CBC: Recent Labs  06/21/12 0400  WBC 6.7  HGB 10.2*  HCT 29.7*  PLT 197   BMET:  Recent Labs  06/21/12 0400  NA 136  K 3.9  CL 98  CO2 27  GLUCOSE 134*  BUN 24*  CREATININE 1.06  CALCIUM 9.5    PT/INR: No results found for this basename: LABPROT, INR,  in the last 72 hours Radiology: Dg Chest 2 View  06/22/2012   *RADIOLOGY REPORT*  Clinical Data: Status post VATS  CHEST - 2 VIEW  Comparison: Prior chest x-ray 06/21/2012  Findings: Stable small left apical pneumothorax. Previously identified right apical pleural thickening and possible small pneumothorax not well seen on today's study.  Unchanged patchy opacities in the left lung base most likely reflecting atelectasis. Cardiac and mediastinal contours are unchanged.  The right IJ approach central venous catheters been removed.  Stable background COPD and emphysema.  No acute osseous abnormality.  IMPRESSION:  1.  Stable small left apical pneumothorax. 2.  Stable left basilar  atelectasis 3.  Interval removal of central venous catheter.   Original Report Authenticated By: Malachy Moan, M.D.   Dg Chest Port 1 View  06/21/2012   *RADIOLOGY REPORT*  Clinical Data: Chest tube removal.  PORTABLE CHEST - 1 VIEW  Comparison: 06/21/2012  Findings: Left-sided chest tube has been removed.  There is a small left apical pneumothorax, estimated to measure less than 5% lung volume.  There is left lung base volume loss/atelectasis.  There is an area of pleural scarring at the right lung apex.  An adjacent pleural line raises the question of small right pneumothorax. There is minimal right base atelectasis. Right IJ central line tip overlies the level of the superior vena cava.  IMPRESSION:   1.  Small left apical pneumothorax following chest tube removal. This appears stable compared to the film earlier today. 2.  A small right apical pleural thickening and possible small pneumothorax.   Original Report Authenticated By: Norva Pavlov, M.D.   Dg Chest Port 1 View  06/21/2012   *RADIOLOGY REPORT*  Clinical Data: Status post segmentectomy  PORTABLE CHEST - 1 VIEW  Comparison: 06/20/2012  Findings: Stable tiny left apical pneumothorax.  Indwelling left apical chest tube.  Postsurgical changes in the left hemithorax.  Stable left lower lobe opacity, likely atelectasis.  Adjacent subcutaneous emphysema along the left lower chest wall.  Underlying chronic interstitial markings/emphysematous changes.  Mild cardiomegaly.  Stable right IJ venous catheter.  IMPRESSION: Stable tiny left apical pneumothorax.  Indwelling left apical chest tube.  Postsurgical changes in the left hemithorax.   Original Report Authenticated By: Charline Bills, M.D.   Dg Chest Port 1 View  06/20/2012   *RADIOLOGY REPORT*  Clinical Data: Removal of chest tube.  PORTABLE CHEST - 1 VIEW  Comparison: 06/20/2012 6:21 a.m.  Findings: One of the two left-sided chest tubes has been removed. Tiny left apical pneumothorax is of similar size (less than 5%). Question tiny pneumothorax left costophrenic angle region versus overlying subcutaneous emphysema.  Left base subsegmental atelectatic changes.  Right central line tip proximal superior vena cava level.  No gross right-sided pneumothorax.  Heart size within normal limits.  Mild mediastinal shift to the left.  IMPRESSION: One of the two left-sided chest tubes has been removed.  Tiny left apical pneumothorax is of similar size (less than 5%).  Question tiny pneumothorax left costophrenic angle region versus overlying subcutaneous emphysema.  Left base subsegmental atelectatic changes.   Original Report Authenticated By: Lacy Duverney, M.D.     Assessment/Plan: S/P Procedure(s)  (LRB): VIDEO ASSISTED THORACOSCOPY (Left) left lower lobe superior SEGMENTECTOMY with node dissection (Left)  1. Doing well 2 BP elevated , will restart HCTZ and cont current meds 3 afib - converted to sinus. CPK-negative. TSH is low so will start po amio at 200 BID to try to prevent amiodarone induced thyroid toxicosis and d/c gtt 4 push pulm toilet/rehab 5 d/c pca    GOLD,WAYNE E 06/22/2012 8:01 AM Home tomorrow if no more a-fb-- coumadin not planned

## 2012-06-22 NOTE — Progress Notes (Signed)
Fentanyl PCA, 5 ml, wasted in sink. Witnessed by Angus Palms, RN. Renette Butters, Viona Gilmore

## 2012-06-22 NOTE — Progress Notes (Signed)
PULMONARY  / CRITICAL CARE MEDICINE  Name: Benjamin Patel MRN: [id number] DOB: 1927/11/04    ADMISSION DATE:  06/16/2012   REFERRING MD :  Dorris Fetch PRIMARY SERVICE:  T[facility]  CHIEF COMPLAINT:  S/p L lower lobe segmentectomy  BRIEF PATIENT DESCRIPTION: 79 y.o.WM with mild Copd found to have nodule on LLL sup segment s/p VATS with LLL segmentectomy 6/2.  Postop doing well .  No smoking since 1978.  Seen preop by MWert.  PMHx with DM2, CKD, ASPVD: carotid , Copd/asthma, HL, HTN Preop PFTs:  FeV1 36%  TLC 97%  RV 177%  DLCO 34%.    SIGNIFICANT EVENTS / STUDIES:  6/2 VATS L  LLL segmentectomy superior segment Path:  ve moderately differentiated Squamous cell carcinoma. + Lymph and vasc invasion 6/7 am  RAF > amio started by T surgery 6/7> converted back to SR 6/7 pm  LINES / TUBES: CT x 2 6/2>>6/6 Posterior tube removed/ 6/8 last ct out IJ CVL R 6/2 > 6/8 Rad art lin 6/2>>out      SUBJECTIVE:  Much better, sitting in chair nad RA  VITAL SIGNS: Temp:  [97.4 F (36.3 C)-98.6 F (37 C)] 98.3 F (36.8 C) (06/08 0754) Pulse Rate:  [65-114] 101 (06/08 0922) Resp:  [14-22] 19 (06/08 0754) BP: (102-173)/(43-62) 164/57 mmHg (06/08 0922) SpO2:  [93 %-100 %] 98 % (06/08 0754) FiO2 (%):  [95 %] 95 % (06/07 1509)  PHYSICAL EXAMINATION: General:  In no distress on [place] oxygen Neuro:  Awake and alert, moves all 4s HEENT:  Moist mucus membranes, no jvd or tmg, R IJ CVL Cardiovascular:  SR 75  nl s1 s2 no s3 s4  Lungs:  Distant BS  No wheeze Abdomen:  Soft nt  bsa  Musculoskeletal:  From, no joint deformity Skin:  clear   Recent Labs Lab 06/19/12 0410 06/21/12 0400 06/22/12 0606  NA 134* 136 136  K 4.5 3.9 3.9  CL 100 98 100  CO2 25 27 24   BUN 29* 24* 19  CREATININE 1.25 1.06 1.08  GLUCOSE 120* 134* 127*    Recent Labs Lab 06/17/12 0430 06/18/12 0340 06/21/12 0400  HGB 11.1* 11.4* 10.2*  HCT 32.1* 33.5* 29.7*  WBC 10.7* 14.8* 6.7  PLT 174 187 197   pcxr 6/8 1.  Stable small left apical pneumothorax.  2. Stable left basilar atelectasis  3. Interval removal of central venous catheter.       ASSESSMENT / PLAN: Principal Problem:   Non-small cell carcinoma of lung, stage 1 Active Problems:   COPD/ GOLD III   Essential hypertension, benign   DM (diabetes mellitus), type 2 with peripheral vascular complications   Renal insufficiency   DM (diabetes mellitus) type II controlled with renal manifestation   CKD (chronic kidney disease) stage 2, GFR 60-89 ml/min    #1. Hx of Copd GOLD III s/p LLL segmentectomy VATS 06/16/12 of LLL nodule Non Small Cell CA Stage IA .   Post op doing well. CXR stable. Pulm status stable postop.  Cont spiriva . No need for saba Weaned off 02 successfuly      #2) Rapid afib - Skaines is cardiologist - TSH 6/7 >>> wnl rx per T surgery     We can see him as inpt/ outpt prn - I explained spiriva is for doe which he denied having preop and could be stopped sev weeks post op to see what if any difference this med makes in his activity tolerance  Sandrea Hughs,  MD Pulmonary and Critical Care Medicine [facility] Cell [phone] After 5:30 PM or weekends, call [phone]

## 2012-06-22 NOTE — Discharge Summary (Signed)
[address]       [place] [address]             [phone]       Benjamin Patel [birth year] 79 y.o. [id number]  06/16/2012   Loreli Slot, MD  left lower lobe mass  HPI: at time of admission; 79 yo man presents with a cc/o a lung mass.  Benjamin Patel is an 79 yo WM with a history of tobacco abuse(quit in 1978) and newly diagnosed COPD. He was in his usual state of good health until January of this year. He developed a persistent cough. It was nonproductive initially but towards the end it was productive with yellow sputum. He had a CXR which showed a possible left lung mass. A CT of the chest was done which confirmed a mass in the LLL. He was treated with antibiotics and the nodule initially got a little smaller. He recently had a follow up CT which showed the mass had increased in size. A PET was done and the lesion was hypermetabolic. There was no evidence of regional or distant metastases.  He says that he is very active. He does not have a regular exercise program, but does something active everyday. He has 2 flights of stairs in the home and can walk up both without getting SOB. He denies any CP or SOB, wheezing, hemoptysis, wt loss, change in appetite.  He was seen and thoracic surgical consultation by Charlett Lango M.D. Who evaluated the patient and his studies and recommended resection. He was admitted this hospitalization for the procedure.  Past Medical History  Diagnosis Date  . Essential hypertension, benign  . Mixed hyperlipidemia  . Asthma  . Gout  . Type II or unspecified type diabetes mellitus without mention of complication, not stated as uncontrolled  . Skin cancer  . Abnormal prostate exam  . Carotid stenosis  . Cholelithiasis  . Renal insufficiency  . Renal artery stenosis  right  . PAC (premature atrial contraction)  . PVCs (premature ventricular contractions)  . Pneumonia 02/08/2012  Past Surgical History  Procedure  Laterality Date  . No past surgeries  Family History  Problem Relation Age of Onset  . Heart disease Mother  . Cancer Father  Social History  History  Substance Use Topics  . Smoking status: Former Smoker -- 1.50 packs/day for 30 years  Types: Cigarettes  Quit date: 01/16/1976  . Smokeless tobacco: Never Used  . Alcohol Use: No  Current Outpatient Prescriptions  Medication Sig Dispense Refill  . albuterol (PROVENTIL HFA;VENTOLIN HFA) 108 (90 BASE) MCG/ACT inhaler Use 1 puff every 4 hours as needed for cough 1 Inhaler 1  . aspirin EC 81 MG tablet Take 81 mg by mouth every evening.  . cloNIDine (CATAPRES) 0.2 MG tablet Take 0.2 mg by mouth 2 (two) times daily.  . cloNIDine (CATAPRES) 0.2 MG tablet TAKE 1 TABLET BY MOUTH TWICE A DAY 60 tablet 5  . hydrochlorothiazide (MICROZIDE) 12.5 MG capsule Take 12.5 mg by mouth daily.  Marland Kitchen losartan (COZAAR) 50 MG tablet TAKE 1 TABLET EVERY DAY 30 tablet 2  . metFORMIN (GLUCOPHAGE) 500 MG tablet Take 500 mg by mouth 2 (two) times daily with a meal.  . metoprolol succinate (TOPROL-XL) 100 MG 24 hr tablet TAKE 1 TABLET BY MOUTH DAILY. TAKE WITH OR IMMEDIATELY FOLLOWING A MEAL. 30 tablet 3  . niacin (NIASPAN) 500 MG CR tablet Take 500 mg by mouth at bedtime.  Marland Kitchen  pravastatin (PRAVACHOL) 20 MG tablet Take 20 mg by mouth every evening.  . pravastatin (PRAVACHOL) 20 MG tablet TAKE 1 TABLET BY MOUTH EVERY DAY 30 tablet 5  No current facility-administered medications for this visit.  Allergies  Allergen Reactions  . Ace Inhibitors Cough  . Codeine Other (See Comments)  GI Upset  . Indomethacin Other (See Comments)  Elevated BP     Hospital Course:  The patient was admitted on 06/16/2012 taken the operating room at which time he underwent the following procedure:  DATE OF PROCEDURE: 06/16/2012  DATE OF DISCHARGE:  OPERATIVE REPORT  PREOPERATIVE DIAGNOSIS: Mass, superior segment, left lower lobe.  POSTOPERATIVE DIAGNOSIS: Stage IA non-small-cell lung  cancer.  PROCEDURE: Left thoracoscopic superior segmentectomy of left lower  lobe, lymph node dissection.  SURGEON: Salvatore Decent. Dorris Fetch, M.D.  ASSISTANT: Doree Fudge, PA  ANESTHESIA: General.  FINDINGS: A 2-cm mass in superior segment of left lower lobe. Incomplete  fissure, two separate pulmonary arterial branches to the superior  segment, margins clear, mass positive for non-small-cell carcinoma on  frozen. The  patient was taken from the operating room to the postanesthetic care  unit, extubated in good condition.  Postoperative hospital course: Pathology has returned and reveals the following:   Diagnosis 1. Lung, resection (segmental or lobe), Left lower lobe - INVASIVE MODERATELY DIFFERENTIATED SQUAMOUS CELL CARCINOMA, SPANNING 2.8 CM IN GREATEST DIMENSION. - LYMPH/VASCULAR INVASION IS IDENTIFIED. - MARGINS ARE NEGATIVE. - SEE ONCOLOGY TEMPLATE. 2. Lymph node, biopsy, 9 L - ONE BENIGN LYMPH NODE WITH NO TUMOR SEEN (0/1). 3. Lymph node, biopsy, #2 - ONE BENIGN LYMPH NODE WITH NO TUMOR SEEN (0/1). 4. Lymph node, biopsy, 10 L - ONE BENIGN LYMPH NODE WITH NO TUMOR SEEN (0/1). 5. Lymph node, biopsy, 5 - ONE BENIGN LYMPH NODE WITH NO TUMOR SEEN (0/1). 6. Lymph node, biopsy, 10 L #2 - ONE BENIGN LYMPH NODE WITH NO TUMOR SEEN (0/1). 7. Lymph node, biopsy, 11 L - ONE BENIGN LYMPH NODE WITH NO TUMOR SEEN (0/1). 8. Lymph node, biopsy, 11 L #2 - ONE BENIGN LYMPH NODE WITH NO TUMOR SEEN (0/1).  Clinically the patient has progressed nicely. He did have an episode of atrial fibrillation with rapid ventricular response and has subsequently been chemically cardioverted to a normal sinus rhythm. The patient's cardiac enzymes were negative.TSH was measured at 0.288 and  the amiodarone was decreased.he does have an acute blood loss anemia and most recent hematocrit is 29.7. All routine lines, monitors and drainage devices have been discontinued in the standard fashion. He is  tolerating gradually increasing activities using standard postoperative protocols. Incisions are healing well without evidence of infection. Oxygen has been weaned and he maintains adequate saturations on room air. After morning round evaluation he was felt to be quite stable for discharge on today's date.    Recent Labs  06/21/12 0400 06/22/12 0606  NA 136 136  K 3.9 3.9  CL 98 100  CO2 27 24  GLUCOSE 134* 127*  BUN 24* 19  CALCIUM 9.5 9.1    Recent Labs  06/21/12 0400  WBC 6.7  HGB 10.2*  HCT 29.7*  PLT 197   No results found for this basename: INR,  in the last 72 hours   Discharge Instructions:  The patient is discharged to home with extensive instructions on wound care and progressive ambulation.  They are instructed not to drive or perform any heavy lifting until returning to see the physician in his office.  Discharge Diagnosis:  left lower lobe mass Invasive moderately differentiated squamous cell carcinoma. Please see pathology report for full details. Postoperative atrial fibrillation following left upper lobe superior segmentectomy. Secondary Diagnosis: Patient Active Problem List   Diagnosis Date Noted  . CKD (chronic kidney disease) stage 2, GFR 60-89 ml/min 06/19/2012  . COPD/ Kelson Queenan III 06/17/2012  . DM (diabetes mellitus) type II controlled with renal manifestation 06/17/2012  . Occlusion and stenosis of carotid artery without mention of cerebral infarction 03/20/2012  . Non-small cell carcinoma of lung, stage 1 02/10/2012  . Essential hypertension, benign   . Mixed hyperlipidemia   . Gout   . DM (diabetes mellitus), type 2 with peripheral vascular complications   . Carotid stenosis   . Cholelithiasis   . Renal insufficiency   . Renal artery stenosis    Past Medical History  Diagnosis Date  . Mixed hyperlipidemia     takes Pravastatin daily  . Gout     no meds required  . Skin cancer   . Abnormal prostate exam   . Carotid stenosis   .  Cholelithiasis   . Renal insufficiency   . Renal artery stenosis     right  . PAC (premature atrial contraction)   . PVCs (premature ventricular contractions)   . Essential hypertension, benign     takes Clonodine,Losartan,and Metoprolol and HCTZ  . Carotid stenosis right side    Dr.Early does carotid ultrasounds yrly-last one Mar 14-report in epic  . Asthma     as a child  . COPD (chronic obstructive pulmonary disease)     slight   . History of bronchitis   . Pneumonia 02/08/2012  . History of colon polyps   . Enlarged prostate     slightly   . Type II or unspecified type diabetes mellitus without mention of complication, not stated as uncontrolled     takes Metformin daily  . History of shingles    Follow-up Information   Follow up with HENDRICKSON,STEVEN C, MD. (2 weeks , office will contact you)    Contact information:   [address] [employer] [address] [place] [address] [phone]        Medication List    TAKE these medications       albuterol 108 (90 BASE) MCG/ACT inhaler  Commonly known as:  PROVENTIL HFA;VENTOLIN HFA  Inhale 1 puff into the lungs every 4 (four) hours as needed for wheezing or shortness of breath (and cough).     amiodarone 200 MG tablet  Commonly known as:  PACERONE  Take 1 tablet (200 mg total) by mouth 2 (two) times daily.     aspirin EC 81 MG tablet  Take 81 mg by mouth every evening.     cloNIDine 0.2 MG tablet  Commonly known as:  CATAPRES  Take 0.2 mg by mouth 2 (two) times daily.     hydrochlorothiazide 25 MG tablet  Commonly known as:  HYDRODIURIL  Take 25 mg by mouth daily.     losartan 50 MG tablet  Commonly known as:  COZAAR  Take 50 mg by mouth daily.     metFORMIN 500 MG tablet  Commonly known as:  GLUCOPHAGE  Take 500 mg by mouth 2 (two) times daily with a meal.     metoprolol succinate 100 MG 24 hr tablet  Commonly known as:  TOPROL-XL  Take 100 mg by mouth daily. Take with or immediately following a meal.       niacin 500 MG CR  tablet  Commonly known as:  NIASPAN  Take 500 mg by mouth at bedtime.     oxyCODONE-acetaminophen 5-325 MG per tablet  Commonly known as:  PERCOCET/ROXICET  Take 1-2 tablets by mouth every 4 (four) hours as needed.     pravastatin 20 MG tablet  Commonly known as:  PRAVACHOL  Take 20 mg by mouth every evening.          Disposition: discharged home  Patient's condition is Good  Gershon Crane, PA-C 06/22/2012  12:21 PM

## 2012-06-23 DIAGNOSIS — I4891 Unspecified atrial fibrillation: Secondary | ICD-10-CM | POA: Diagnosis not present

## 2012-06-23 LAB — GLUCOSE, CAPILLARY

## 2012-06-23 MED ORDER — OXYCODONE-ACETAMINOPHEN 5-325 MG PO TABS: 1.0000 | ORAL_TABLET | ORAL | Status: DC | PRN

## 2012-06-23 MED ORDER — AMIODARONE HCL 200 MG PO TABS: 200.0000 mg | ORAL_TABLET | Freq: Two times a day (BID) | ORAL | Status: DC

## 2012-06-23 NOTE — Progress Notes (Addendum)
[facility] DAILY ICU PROGRESS NOTE                   [address]            [place] [address]          [phone]   7 Days Post-Op Procedure(s) (LRB): VIDEO ASSISTED THORACOSCOPY (Left) left lower lobe superior SEGMENTECTOMY with node dissection (Left)  Total Length of Stay:  LOS: 7 days   Subjective: Feels ok, no new issues  Objective: Vital signs in last 24 hours: Temp:  [97.7 F (36.5 C)-98.7 F (37.1 C)] 97.9 F (36.6 C) (06/09 0716) Pulse Rate:  [83-101] 90 (06/09 0407) Cardiac Rhythm:  [-] Normal sinus rhythm (06/09 0407) Resp:  [12-30] 12 (06/09 0407) BP: (122-171)/(54-73) 165/73 mmHg (06/09 0407) SpO2:  [96 %-100 %] 97 % (06/09 0407)  Filed Weights   06/18/12 0400 06/19/12 0600 06/20/12 0600  Weight: 165 lb 12.6 oz (75.2 kg) 168 lb 3.4 oz (76.3 kg) 164 lb 3.9 oz (74.5 kg)    Weight change:    Hemodynamic parameters for last 24 hours:    Intake/Output from previous day: 06/08 0701 - 06/09 0700 In: 773.4 [P.O.:720; I.V.:53.4] Out: 800 [Urine:800]  Intake/Output this shift:    Current Meds: Scheduled Meds: . amiodarone  200 mg Oral BID  . aspirin EC  81 mg Oral QPM  . bisacodyl  10 mg Oral Daily  . cloNIDine  0.2 mg Oral BID  . enoxaparin (LOVENOX) injection  30 mg Subcutaneous Q24H  . hydrochlorothiazide  25 mg Oral Daily  . insulin aspart  0-15 Units Subcutaneous TID AC & HS  . losartan  50 mg Oral Daily  . metoprolol succinate  50 mg Oral Daily  . niacin  500 mg Oral QHS  . pantoprazole  40 mg Oral Daily  . simvastatin  10 mg Oral q1800  . tiotropium  18 mcg Inhalation Daily   Continuous Infusions: . sodium chloride 20 mL/hr at 06/20/12 0700   PRN Meds:.bisacodyl, ondansetron (ZOFRAN) IV, oxyCODONE-acetaminophen, potassium chloride, senna-docusate, sodium phosphate, traMADol  General appearance: alert, cooperative and no distress Heart: regular rate and rhythm Lungs: clear to auscultation bilaterally Abdomen:  benign Extremities: no edema Wound: oncisions healing well  Lab Results: CBC: Recent Labs  06/21/12 0400  WBC 6.7  HGB 10.2*  HCT 29.7*  PLT 197   BMET:  Recent Labs  06/21/12 0400 06/22/12 0606  NA 136 136  K 3.9 3.9  CL 98 100  CO2 27 24  GLUCOSE 134* 127*  BUN 24* 19  CREATININE 1.06 1.08  CALCIUM 9.5 9.1    PT/INR: No results found for this basename: LABPROT, INR,  in the last 72 hours Radiology: Dg Chest 2 View  06/22/2012   *RADIOLOGY REPORT*  Clinical Data: Status post VATS  CHEST - 2 VIEW  Comparison: Prior chest x-ray 06/21/2012  Findings: Stable small left apical pneumothorax. Previously identified right apical pleural thickening and possible small pneumothorax not well seen on today's study.  Unchanged patchy opacities in the left lung base most likely reflecting atelectasis. Cardiac and mediastinal contours are unchanged.  The right IJ approach central venous catheters been removed.  Stable background COPD and emphysema.  No acute osseous abnormality.  IMPRESSION:  1.  Stable small left apical pneumothorax. 2.  Stable left basilar atelectasis 3.  Interval removal of central venous catheter.   Original Report Authenticated By: Malachy Moan, M.D.   Dg Chest Port 1  View  06/21/2012   *RADIOLOGY REPORT*  Clinical Data: Chest tube removal.  PORTABLE CHEST - 1 VIEW  Comparison: 06/21/2012  Findings: Left-sided chest tube has been removed.  There is a small left apical pneumothorax, estimated to measure less than 5% lung volume.  There is left lung base volume loss/atelectasis.  There is an area of pleural scarring at the right lung apex.  An adjacent pleural line raises the question of small right pneumothorax. There is minimal right base atelectasis. Right IJ central line tip overlies the level of the superior vena cava.  IMPRESSION:  1.  Small left apical pneumothorax following chest tube removal. This appears stable compared to the film earlier today. 2.  A small right  apical pleural thickening and possible small pneumothorax.   Original Report Authenticated By: Norva Pavlov, M.D.   Dg Chest Port 1 View  06/21/2012   *RADIOLOGY REPORT*  Clinical Data: Status post segmentectomy  PORTABLE CHEST - 1 VIEW  Comparison: 06/20/2012  Findings: Stable tiny left apical pneumothorax.  Indwelling left apical chest tube.  Postsurgical changes in the left hemithorax.  Stable left lower lobe opacity, likely atelectasis.  Adjacent subcutaneous emphysema along the left lower chest wall.  Underlying chronic interstitial markings/emphysematous changes.  Mild cardiomegaly.  Stable right IJ venous catheter.  IMPRESSION: Stable tiny left apical pneumothorax.  Indwelling left apical chest tube.  Postsurgical changes in the left hemithorax.   Original Report Authenticated By: Charline Bills, M.D.     Assessment/Plan: S/P Procedure(s) (LRB): VIDEO ASSISTED THORACOSCOPY (Left) left lower lobe superior SEGMENTECTOMY with node dissection (Left) Plan for discharge: see discharge orders Increase beta blocker to home dose    Shatasia Cutshaw E 06/23/2012 7:25 AM  Patient seen and examined. Agree with above.  DC home

## 2012-06-23 NOTE — Progress Notes (Signed)
Patient being discharged per MD order, all discharge instructions given to patient and wife, verbalized understanding.

## 2012-06-23 NOTE — Progress Notes (Signed)
Utilization review completed.  

## 2012-06-25 ENCOUNTER — Other Ambulatory Visit: Payer: Self-pay

## 2012-06-25 ENCOUNTER — Other Ambulatory Visit: Payer: Self-pay | Admitting: Physician Assistant

## 2012-06-25 MED ORDER — METOPROLOL SUCCINATE ER 100 MG PO TB24: 100.0000 mg | ORAL_TABLET | Freq: Every day | ORAL | Status: DC

## 2012-06-26 ENCOUNTER — Other Ambulatory Visit: Payer: Self-pay

## 2012-06-26 ENCOUNTER — Ambulatory Visit
Admission: RE | Admit: 2012-06-26 | Discharge: 2012-06-26 | Disposition: A | Discharge status: Home / Self Care | Payer: Medicare Other | Source: Ambulatory Visit | Attending: Thoracic Surgery (Cardiothoracic Vascular Surgery) | Admitting: Thoracic Surgery (Cardiothoracic Vascular Surgery)

## 2012-06-26 ENCOUNTER — Ambulatory Visit (INDEPENDENT_AMBULATORY_CARE_PROVIDER_SITE_OTHER): Payer: Medicare Other | Admitting: Physician Assistant

## 2012-06-26 VITALS — BP 112/61 | HR 78 | Temp 96.8°F | Resp 20 | Ht 67.0 in | Wt 164.0 lb

## 2012-06-26 DIAGNOSIS — C3490 Malignant neoplasm of unspecified part of unspecified bronchus or lung: Secondary | ICD-10-CM

## 2012-06-26 DIAGNOSIS — R918 Other nonspecific abnormal finding of lung field: Secondary | ICD-10-CM

## 2012-06-26 DIAGNOSIS — J9383 Other pneumothorax: Secondary | ICD-10-CM | POA: Diagnosis not present

## 2012-06-26 DIAGNOSIS — T8131XA Disruption of external operation (surgical) wound, not elsewhere classified, initial encounter: Secondary | ICD-10-CM

## 2012-06-26 DIAGNOSIS — R222 Localized swelling, mass and lump, trunk: Secondary | ICD-10-CM

## 2012-06-26 NOTE — Progress Notes (Signed)
  HPI:  Patient returns for routine postoperative follow-up having undergone Left thoracoscopic superior segmentectomy of left lower lobe and lymph node dissection done on 06/16/2012.  The patient's hospital stay was uncomplicated and was just discharged home on 06/22/2012.  The patient presents to clinic today with a complaint of drainage from his chest tube incisions.  He states that he is saturating dressings a few times per day however, it is worse at night.  The fluid is clear with no evidence of purulence or odor.  He denies fevers and redness from chest tube sites.  Current Outpatient Prescriptions  Medication Sig Dispense Refill  . albuterol (PROVENTIL HFA;VENTOLIN HFA) 108 (90 BASE) MCG/ACT inhaler Inhale 1 puff into the lungs every 4 (four) hours as needed for wheezing or shortness of breath (and cough).      Marland Kitchen amiodarone (PACERONE) 200 MG tablet Take 1 tablet (200 mg total) by mouth 2 (two) times daily.  60 tablet  0  . aspirin EC 81 MG tablet Take 81 mg by mouth every evening.      . cloNIDine (CATAPRES) 0.2 MG tablet Take 0.2 mg by mouth 2 (two) times daily.      . hydrochlorothiazide (HYDRODIURIL) 25 MG tablet Take 25 mg by mouth daily.      Marland Kitchen losartan (COZAAR) 50 MG tablet Take 50 mg by mouth daily.      . metFORMIN (GLUCOPHAGE) 500 MG tablet Take 500 mg by mouth 2 (two) times daily with a meal.      . metoprolol succinate (TOPROL-XL) 100 MG 24 hr tablet Take 1 tablet (100 mg total) by mouth daily. Take with or immediately following a meal.  30 tablet  1  . niacin (NIASPAN) 500 MG CR tablet TAKE 1 TABLET (500 MG TOTAL) BY MOUTH AT BEDTIME. PATIENT NEEDS OFFICE VISIT FOR ADDITIONAL REFILLS  30 tablet  3  . oxyCODONE-acetaminophen (PERCOCET/ROXICET) 5-325 MG per tablet Take 1-2 tablets by mouth every 4 (four) hours as needed.  50 tablet  0  . pravastatin (PRAVACHOL) 20 MG tablet Take 20 mg by mouth every evening.       No current facility-administered medications for this visit.     Physical Exam:  BP 112/61  Pulse 78  Temp(Src) 96.8 F (36 C) (Oral)  Resp 20  Ht 5\' 7"  (1.702 m)  Wt 164 lb (74.39 kg)  BMI 25.68 kg/m2  SpO2 96%  Gen: no apparent distress Lungs: diminished breath sounds left base Heart: RRR Skin: chest tube incisions are healing nicely, however they are not completely closed.  There is evidence of serosanguinous drainage on the dressings however no frank drainage is present at the site.  There is no surrounding erythema present.   Diagnostic Tests:  CXR: small residual left pleural effusion, improvement of apical pneumothorax  1. Serosanquinous drainage from chest tube sites- no evidence of infection present- the chest tube sites have not fully closed yet and being the patient sleeps with his left side down I am no concerned about the leaking of fluid.  2. RTC on 07/08/2012 as scheduled with Dr. Dorris Fetch------- of note patient instructed to call our office should his chest tube sites become red, foul smelling or purulent drainage develops or he develops a fever.

## 2012-07-02 ENCOUNTER — Other Ambulatory Visit: Payer: Self-pay | Admitting: *Deleted

## 2012-07-02 DIAGNOSIS — C3490 Malignant neoplasm of unspecified part of unspecified bronchus or lung: Secondary | ICD-10-CM

## 2012-07-08 ENCOUNTER — Ambulatory Visit: Payer: Self-pay | Admitting: Thoracic Surgery (Cardiothoracic Vascular Surgery)

## 2012-07-11 ENCOUNTER — Ambulatory Visit (INDEPENDENT_AMBULATORY_CARE_PROVIDER_SITE_OTHER): Payer: Self-pay | Admitting: Thoracic Surgery (Cardiothoracic Vascular Surgery)

## 2012-07-11 ENCOUNTER — Encounter: Payer: Self-pay | Admitting: Thoracic Surgery (Cardiothoracic Vascular Surgery)

## 2012-07-11 ENCOUNTER — Ambulatory Visit
Admission: RE | Admit: 2012-07-11 | Discharge: 2012-07-11 | Disposition: A | Discharge status: Home / Self Care | Payer: Medicare Other | Source: Ambulatory Visit | Attending: Thoracic Surgery (Cardiothoracic Vascular Surgery) | Admitting: Thoracic Surgery (Cardiothoracic Vascular Surgery)

## 2012-07-11 VITALS — BP 130/59 | HR 50 | Resp 20 | Ht 68.0 in | Wt 160.0 lb

## 2012-07-11 DIAGNOSIS — C3490 Malignant neoplasm of unspecified part of unspecified bronchus or lung: Secondary | ICD-10-CM | POA: Diagnosis not present

## 2012-07-11 DIAGNOSIS — R918 Other nonspecific abnormal finding of lung field: Secondary | ICD-10-CM

## 2012-07-11 DIAGNOSIS — R222 Localized swelling, mass and lump, trunk: Secondary | ICD-10-CM

## 2012-07-11 NOTE — Progress Notes (Signed)
HPI:  Benjamin Patel is an 79 year old gentleman who underwent a left lower lobe superior segmentectomy on 06/16/12 for what turned out to be a stage IA non-small cell carcinoma.   Postoperative course was complicated by atrial fibrillation, but he was discharged home on postoperative day #7.   He has done well since the time of discharge. He's having minimal pain. He took a few pain pills when he first got home but has not taken any a couple of weeks now. He is not having any difficulty with his breathing. His Spiriva was stopped and he hasn't noticed any changes due to that. He had questions about how long he would need to be on amiodarone.  Past Medical History  Diagnosis Date  . Mixed hyperlipidemia     takes Pravastatin daily  . Gout     no meds required  . Skin cancer   . Abnormal prostate exam   . Carotid stenosis   . Cholelithiasis   . Renal insufficiency   . Renal artery stenosis     right  . PAC (premature atrial contraction)   . PVCs (premature ventricular contractions)   . Essential hypertension, benign     takes Clonodine,Losartan,and Metoprolol and HCTZ  . Carotid stenosis right side    Dr.Early does carotid ultrasounds yrly-last one Mar 14-report in epic  . Asthma     as a child  . COPD (chronic obstructive pulmonary disease)     slight   . History of bronchitis   . Pneumonia 02/08/2012  . History of colon polyps   . Enlarged prostate     slightly   . Type II or unspecified type diabetes mellitus without mention of complication, not stated as uncontrolled     takes Metformin daily  . History of shingles       Current Outpatient Prescriptions  Medication Sig Dispense Refill  . albuterol (PROVENTIL HFA;VENTOLIN HFA) 108 (90 BASE) MCG/ACT inhaler Inhale 1 puff into the lungs every 4 (four) hours as needed for wheezing or shortness of breath (and cough).      Marland Kitchen amiodarone (PACERONE) 200 MG tablet Take 1 tablet (200 mg total) by mouth 2 (two) times daily.  60  tablet  0  . aspirin EC 81 MG tablet Take 81 mg by mouth every evening.      . cloNIDine (CATAPRES) 0.2 MG tablet Take 0.2 mg by mouth 2 (two) times daily.      . hydrochlorothiazide (HYDRODIURIL) 25 MG tablet Take 25 mg by mouth daily.      Marland Kitchen losartan (COZAAR) 50 MG tablet Take 50 mg by mouth daily.      . metFORMIN (GLUCOPHAGE) 500 MG tablet Take 500 mg by mouth 2 (two) times daily with a meal.      . metoprolol succinate (TOPROL-XL) 100 MG 24 hr tablet Take 1 tablet (100 mg total) by mouth daily. Take with or immediately following a meal.  30 tablet  1  . niacin (NIASPAN) 500 MG CR tablet TAKE 1 TABLET (500 MG TOTAL) BY MOUTH AT BEDTIME. PATIENT NEEDS OFFICE VISIT FOR ADDITIONAL REFILLS  30 tablet  3  . oxyCODONE-acetaminophen (PERCOCET/ROXICET) 5-325 MG per tablet Take 1-2 tablets by mouth every 4 (four) hours as needed.  50 tablet  0  . pravastatin (PRAVACHOL) 20 MG tablet Take 20 mg by mouth every evening.       No current facility-administered medications for this visit.    Physical Exam BP 130/59  Pulse 50  Resp 20  Ht 5\' 8"  (1.727 m)  Wt 160 lb (72.576 kg)  BMI 24.33 kg/m2  SpO14 38% 79 year old male in no acute distress Lungs diminished but essentially equal bilaterally Incisions well healed No peripheral edema  Diagnostic Tests: Chest x-ray shows postoperative changes on the left  Impression: Benjamin Patel is an 79 year old gentleman who had a superior segmentectomy of the left lower lobe for a stage IA non-small cell carcinoma. He is doing very well at this point in time. He is having minimal discomfort. His respiratory status is stable.  He did have atrial fibrillation postoperatively and was sent home on amiodarone 200 mg by mouth twice a day. I advised him to finish out his prescription but not to refill that medication, and we will see how he does. He's probably far enough out at this point that he is unlikely to have atrial fibrillation again if it was just related to  his surgery.  He's having minimal discomfort and wishes to increase his activities. This point there are no restrictions on his activities although did caution him to build into new activities gradually. He may begin driving cautiously. Appropriate precautions were discussed.   Plan:  I will plan to see him back in 3 months for his 4 month followup visit. We will do a CT of the chest at that time.

## 2012-07-16 ENCOUNTER — Emergency Department (HOSPITAL_COMMUNITY): Payer: Medicare Other

## 2012-07-16 ENCOUNTER — Ambulatory Visit (INDEPENDENT_AMBULATORY_CARE_PROVIDER_SITE_OTHER): Payer: Medicare Other | Admitting: Emergency Medicine

## 2012-07-16 ENCOUNTER — Emergency Department (HOSPITAL_COMMUNITY)
Admission: EM | Admit: 2012-07-16 | Discharge: 2012-07-16 | Disposition: A | Discharge status: Home / Self Care | Payer: Medicare Other | Attending: Emergency Medicine | Admitting: Emergency Medicine

## 2012-07-16 VITALS — BP 206/80 | HR 76 | Temp 98.1°F | Resp 16 | Ht 67.5 in | Wt 162.0 lb

## 2012-07-16 DIAGNOSIS — Z7982 Long term (current) use of aspirin: Secondary | ICD-10-CM | POA: Insufficient documentation

## 2012-07-16 DIAGNOSIS — R6889 Other general symptoms and signs: Secondary | ICD-10-CM | POA: Diagnosis not present

## 2012-07-16 DIAGNOSIS — E782 Mixed hyperlipidemia: Secondary | ICD-10-CM | POA: Diagnosis not present

## 2012-07-16 DIAGNOSIS — T465X5A Adverse effect of other antihypertensive drugs, initial encounter: Secondary | ICD-10-CM | POA: Insufficient documentation

## 2012-07-16 DIAGNOSIS — T783XXA Angioneurotic edema, initial encounter: Secondary | ICD-10-CM | POA: Diagnosis not present

## 2012-07-16 DIAGNOSIS — C3490 Malignant neoplasm of unspecified part of unspecified bronchus or lung: Secondary | ICD-10-CM | POA: Diagnosis not present

## 2012-07-16 DIAGNOSIS — Z79899 Other long term (current) drug therapy: Secondary | ICD-10-CM | POA: Insufficient documentation

## 2012-07-16 DIAGNOSIS — J449 Chronic obstructive pulmonary disease, unspecified: Secondary | ICD-10-CM | POA: Insufficient documentation

## 2012-07-16 DIAGNOSIS — Z87891 Personal history of nicotine dependence: Secondary | ICD-10-CM | POA: Diagnosis not present

## 2012-07-16 DIAGNOSIS — Z8619 Personal history of other infectious and parasitic diseases: Secondary | ICD-10-CM | POA: Insufficient documentation

## 2012-07-16 DIAGNOSIS — Z8679 Personal history of other diseases of the circulatory system: Secondary | ICD-10-CM | POA: Insufficient documentation

## 2012-07-16 DIAGNOSIS — Z8719 Personal history of other diseases of the digestive system: Secondary | ICD-10-CM | POA: Insufficient documentation

## 2012-07-16 DIAGNOSIS — Z85828 Personal history of other malignant neoplasm of skin: Secondary | ICD-10-CM | POA: Diagnosis not present

## 2012-07-16 DIAGNOSIS — I10 Essential (primary) hypertension: Secondary | ICD-10-CM | POA: Diagnosis not present

## 2012-07-16 DIAGNOSIS — Z8701 Personal history of pneumonia (recurrent): Secondary | ICD-10-CM | POA: Diagnosis not present

## 2012-07-16 DIAGNOSIS — Z860100 Personal history of colon polyps, unspecified: Secondary | ICD-10-CM | POA: Insufficient documentation

## 2012-07-16 DIAGNOSIS — Z862 Personal history of diseases of the blood and blood-forming organs and certain disorders involving the immune mechanism: Secondary | ICD-10-CM | POA: Diagnosis not present

## 2012-07-16 DIAGNOSIS — E119 Type 2 diabetes mellitus without complications: Secondary | ICD-10-CM | POA: Insufficient documentation

## 2012-07-16 DIAGNOSIS — Z87448 Personal history of other diseases of urinary system: Secondary | ICD-10-CM | POA: Diagnosis not present

## 2012-07-16 DIAGNOSIS — Z8639 Personal history of other endocrine, nutritional and metabolic disease: Secondary | ICD-10-CM | POA: Insufficient documentation

## 2012-07-16 DIAGNOSIS — R220 Localized swelling, mass and lump, head: Secondary | ICD-10-CM | POA: Diagnosis not present

## 2012-07-16 DIAGNOSIS — R609 Edema, unspecified: Secondary | ICD-10-CM | POA: Diagnosis not present

## 2012-07-16 DIAGNOSIS — J4489 Other specified chronic obstructive pulmonary disease: Secondary | ICD-10-CM | POA: Insufficient documentation

## 2012-07-16 LAB — CBC WITH DIFFERENTIAL/PLATELET
Basophils Absolute: 0 10*3/uL (ref 0.0–0.1)
Basophils Relative: 1 % (ref 0–1)
Eosinophils Absolute: 0.2 10*3/uL (ref 0.0–0.7)
Eosinophils Relative: 2 % (ref 0–5)
HCT: 37.9 % — ABNORMAL LOW (ref 39.0–52.0)
MCH: 29.9 pg (ref 26.0–34.0)
MCHC: 33.2 g/dL (ref 30.0–36.0)
MCV: 90 fL (ref 78.0–100.0)
Monocytes Absolute: 0.6 10*3/uL (ref 0.1–1.0)
Neutro Abs: 6.4 10*3/uL (ref 1.7–7.7)
RDW: 13.9 % (ref 11.5–15.5)

## 2012-07-16 LAB — BASIC METABOLIC PANEL
Calcium: 10.2 mg/dL (ref 8.4–10.5)
Creatinine, Ser: 1.29 mg/dL (ref 0.50–1.35)
GFR calc Af Amer: 58 mL/min — ABNORMAL LOW (ref >90)

## 2012-07-16 MED ORDER — FAMOTIDINE 20 MG PO TABS: 20.0000 mg | ORAL_TABLET | Freq: Two times a day (BID) | ORAL | Status: DC

## 2012-07-16 MED ORDER — METHYLPREDNISOLONE SODIUM SUCC 125 MG IJ SOLR
125.0000 mg | Freq: Once | INTRAMUSCULAR | Status: AC
  Administered 2012-07-16: 125 mg via INTRAVENOUS
  Filled 2012-07-16: qty 2

## 2012-07-16 MED ORDER — DIPHENHYDRAMINE HCL 50 MG/ML IJ SOLN
12.5000 mg | Freq: Once | INTRAMUSCULAR | Status: AC
  Administered 2012-07-16: 12.5 mg via INTRAVENOUS
  Filled 2012-07-16: qty 1

## 2012-07-16 MED ORDER — FAMOTIDINE IN NACL 20-0.9 MG/50ML-% IV SOLN
20.0000 mg | Freq: Once | INTRAVENOUS | Status: AC
  Administered 2012-07-16: 20 mg via INTRAVENOUS
  Filled 2012-07-16: qty 50

## 2012-07-16 MED ORDER — DIPHENHYDRAMINE HCL 25 MG PO CAPS: 25.0000 mg | ORAL_CAPSULE | Freq: Four times a day (QID) | ORAL | Status: DC | PRN

## 2012-07-16 NOTE — ED Notes (Signed)
MD at bedside. Dr. Effie Shy at bedside speaking with pt.

## 2012-07-16 NOTE — ED Notes (Signed)
YNW:[id number]> Expected date:<BR> Expected time:<BR> Means of arrival:<BR> Comments:<BR> 79 y/o angioedema

## 2012-07-16 NOTE — ED Notes (Signed)
MD at bedside. Dr. Wentz at bedside.  

## 2012-07-16 NOTE — ED Notes (Signed)
Pt BIB EMS. Pt arrives from MDs office. Pt has swelling to his tongue since 1530. Pt is not taking any ACE inhibitors. Pt has no difficulty breathing, or swallowing per EMS. Pt with no acute distress. Breaths even/unlabored. Pt a/o x 4. Pt's wife at bedside.

## 2012-07-16 NOTE — ED Provider Notes (Signed)
Benjamin Patel is a 79 y.o. male who presents from his physician's office, by EMS, after an evaluation for tongue swelling. He does not know what caused it. There has been no new food or medication exposures. He has not had this previously. He does not use bronchodilator. His recent left lung resection for cancer.  Exam- frail, elderly, male. Mouth with mild lingual swelling. Left lateral and right anterior. No swelling of the posterior pharynx. Airway is intact. Lung- decreased air movement bilaterally, left greater than right, with scattered end expiratory wheezes. Poor air movement, bilaterally. Neurologic-  nonfocal  Assessment: Angioedema related to ARB. Possible component of bronchospasm and decreased air movement.  Comprehensive evaluation, and treatment, ordered.  8:00 PM Reevaluation with update and discussion. After initial assessment and treatment, an updated evaluation reveals Lungs- good air mvt. No wheezing. Decreased tongue edema. Benjamin Patel   Pt will call PCP in AM to get check up in 1-2 days.   Medical screening examination/treatment/procedure(s) were performed by non-physician practitioner and as supervising physician I was immediately available for consultation/collaboration.  Flint Melter, MD 07/16/12 2004

## 2012-07-16 NOTE — ED Notes (Signed)
Pt has some tongue swelling. Pt states he has no difficulty breathing or swallowing. Pt with no acute distress. Pt's wife at bedside.

## 2012-07-16 NOTE — ED Provider Notes (Signed)
Medical screening examination/treatment/procedure(s) were conducted as a shared visit with non-physician practitioner(s) and myself.  I personally evaluated the patient during the encounter  Flint Melter, MD 07/16/12 2236

## 2012-07-16 NOTE — ED Notes (Signed)
Pt states it feels like his tongue is not as swollen. Pt with no acute distress. Pt states he is feeling better. Family at bedside.

## 2012-07-16 NOTE — ED Provider Notes (Signed)
History    CSN: [id number] Arrival date & time 07/16/12  1704  First MD Initiated Contact with Patient 07/16/12 1710     No chief complaint on file.  (Consider location/radiation/quality/duration/timing/severity/associated sxs/prior Treatment) HPI  79 year old male with history of allergies to ACE inhibitor presents for evaluations of angioedema. Patient is currently taking an ARB (Losartan) blood pressure medication which he has been taking it for a long time without having any reaction. He was found to have marked swelling of the left side of his tongue 3 hrs ago.  No report of exposure to new medication, food, or allergens. No complaint of trouble breathing, hoarseness, difficulty swallowing, shortness of breath, itch, or rash. The last time he took his losartan was last night. Patient has no other complaint.  Wife gave him Claritin initially with no improvement. He was initially seen by his PCP for this complaint earlier today and was sent here to the ER for further management. Patient reports he had a lung surgery to remove a tumor last month which he was staying in hospital for nearly a week.  He has been home for the past 3 weeks.        Past Medical History  Diagnosis Date  . Mixed hyperlipidemia     takes Pravastatin daily  . Gout     no meds required  . Skin cancer   . Abnormal prostate exam   . Carotid stenosis   . Cholelithiasis   . Renal insufficiency   . Renal artery stenosis     right  . PAC (premature atrial contraction)   . PVCs (premature ventricular contractions)   . Essential hypertension, benign     takes Clonodine,Losartan,and Metoprolol and HCTZ  . Carotid stenosis right side    Dr.Early does carotid ultrasounds yrly-last one Mar 14-report in epic  . Asthma     as a child  . COPD (chronic obstructive pulmonary disease)     slight   . History of bronchitis   . Pneumonia 02/08/2012  . History of colon polyps   . Enlarged prostate     slightly   .  Type II or unspecified type diabetes mellitus without mention of complication, not stated as uncontrolled     takes Metformin daily  . History of shingles    Past Surgical History  Procedure Laterality Date  . No past surgeries    . Colonoscopy    . Video assisted thoracoscopy Left 06/16/2012    Procedure: VIDEO ASSISTED THORACOSCOPY;  Surgeon: Loreli Slot, MD;  Location: [facility] OR;  Service: Thoracic;  Laterality: Left;  . Segmentecomy Left 06/16/2012    Procedure: left lower lobe superior SEGMENTECTOMY with node dissection;  Surgeon: Loreli Slot, MD;  Location: [facility] OR;  Service: Thoracic;  Laterality: Left;  left superior   Family History  Problem Relation Age of Onset  . Heart disease Mother   . Cancer Father    History  Substance Use Topics  . Smoking status: Former Smoker -- 1.50 packs/day for 30 years    Types: Cigarettes    Quit date: 01/16/1976  . Smokeless tobacco: Never Used  . Alcohol Use: No    Review of Systems  All other systems reviewed and are negative.    Allergies  Ace inhibitors; Codeine; and Indomethacin  Home Medications   Current Outpatient Rx  Name  Route  Sig  Dispense  Refill  . albuterol (PROVENTIL HFA;VENTOLIN HFA) 108 (90 BASE) MCG/ACT inhaler   Inhalation  Inhale 1 puff into the lungs every 4 (four) hours as needed for wheezing or shortness of breath (and cough).         Marland Kitchen amiodarone (PACERONE) 200 MG tablet   Oral   Take 1 tablet (200 mg total) by mouth 2 (two) times daily.   60 tablet   0   . aspirin EC 81 MG tablet   Oral   Take 81 mg by mouth every evening.         . cloNIDine (CATAPRES) 0.2 MG tablet   Oral   Take 0.2 mg by mouth 2 (two) times daily.         . hydrochlorothiazide (HYDRODIURIL) 25 MG tablet   Oral   Take 25 mg by mouth daily.         Marland Kitchen losartan (COZAAR) 50 MG tablet   Oral   Take 50 mg by mouth daily.         . metFORMIN (GLUCOPHAGE) 500 MG tablet   Oral   Take 500 mg by mouth 2  (two) times daily with a meal.         . metoprolol succinate (TOPROL-XL) 100 MG 24 hr tablet   Oral   Take 1 tablet (100 mg total) by mouth daily. Take with or immediately following a meal.   30 tablet   1   . niacin (NIASPAN) 500 MG CR tablet      TAKE 1 TABLET (500 MG TOTAL) BY MOUTH AT BEDTIME. PATIENT NEEDS OFFICE VISIT FOR ADDITIONAL REFILLS   30 tablet   3   . oxyCODONE-acetaminophen (PERCOCET/ROXICET) 5-325 MG per tablet   Oral   Take 1-2 tablets by mouth every 4 (four) hours as needed.   50 tablet   0   . pravastatin (PRAVACHOL) 20 MG tablet   Oral   Take 20 mg by mouth every evening.          There were no vitals taken for this visit. Physical Exam  Nursing note and vitals reviewed. Constitutional: He is oriented to person, place, and time. He appears well-developed and well-nourished. No distress.  HENT:  Head: Atraumatic.  Mild edema noted to Left side of tongue without significant mucosal involvement.  No airway compromise  Eyes: Conjunctivae are normal.  Neck: Normal range of motion. Neck supple. No JVD present. No tracheal deviation present. No thyromegaly present.  Cardiovascular: Normal rate and regular rhythm.   Pulmonary/Chest: Effort normal. No stridor. He has no wheezes. He exhibits no tenderness.  Abdominal: Soft. There is no tenderness.  Lymphadenopathy:    He has no cervical adenopathy.  Neurological: He is alert and oriented to person, place, and time.  Skin: Skin is warm. No rash noted.  Psychiatric: He has a normal mood and affect.    ED Course  Procedures (including critical care time)   Date: 07/16/2012  Rate: 69  Rhythm: normal sinus rhythm  QRS Axis: normal Borderline Right Axis Deviation  Intervals: normal  ST/T Wave abnormalities: normal  Conduction Disutrbances:none  Narrative Interpretation:   Old EKG Reviewed: unchanged and normal ecg as compare to prior which shows afib.    5:32 PM Pt with angioedema involving left  side of tongue likely 2/2 to ARB.  It has been 3 hrs and pt felt that it's ipmrovement despite any specific treatment.  No evidence of airway compromise.  WIll start pt on prednisone, benadryl, pepcid.  Care discussed with attending.  Will continue to monitor.  Pt is made  aware to avoid taking his Losartan and to have his doctor change his medication.  Pt is currently hypertensive. Will continue to monitor.    7:45 PM Moderate improvement of lingular edema as compared to prior. Patient reports feeling better. Patient advised to avoid his medication for close followup. Otherwise patient is stable for discharge. Blood pressure normalized. Return precautions discussed.   Labs Reviewed  CBC WITH DIFFERENTIAL - Abnormal; Notable for the following:    RBC 4.21 (*)    Hemoglobin 12.6 (*)    HCT 37.9 (*)    All other components within normal limits  BASIC METABOLIC PANEL - Abnormal; Notable for the following:    Glucose, Bld 117 (*)    BUN 24 (*)    GFR calc non Af Amer 50 (*)    GFR calc Af Amer 58 (*)    All other components within normal limits   Dg Chest 2 View  07/16/2012   *RADIOLOGY REPORT*  Clinical Data: Ex-smoker.  Status post surgery for lung cancer 1 month ago.  Tongue swelling today.  CHEST - 2 VIEW  Comparison: 07/11/2012.  Findings: The heart remains normal in size.  Stable prominence of the interstitial markings and left basilar pleural and parenchymal scarring.  The lungs remain hyperexpanded.  Diffuse osteopenia.  No significant change in mild thoracic vertebral compression deformities.  IMPRESSION:  1.  No acute abnormality. 2.  Stable changes of COPD with left basilar scarring.   Original Report Authenticated By: Beckie Salts, M.D.   1. Allergic angioedema, initial encounter     MDM  BP 168/69  Pulse 65  Temp(Src) 97.8 F (36.6 C) (Oral)  Resp 16  SpO2 97%  I have reviewed nursing notes and vital signs. I personally reviewed the imaging tests through PACS system  I  reviewed available ER/hospitalization records thought the EMR     Fayrene Helper, [place] 07/16/12 1951

## 2012-07-16 NOTE — Progress Notes (Signed)
[facility] [address], [place] [address] [phone]  Date:  07/16/2012   Name:  Benjamin Patel   DOB:  December 08, 1927   MRN:  [id number]  PCP:  Lucilla Edin, MD    Chief Complaint: Allergic Reaction   History of Present Illness:  Benjamin Patel is a 79 y.o. very pleasant male patient who presents with the following:  The patient is an 79 year old man currently taking an ARB.  Has been taking the medication for a "long time" per patient uneventfully.  Noted to have marked swelling of the left side of his tongue about 2 hours ago in the absence of exposure to a new allergen, medication, or food.  He has no difficulty breathing, rash, shortness of breath, hoarseness, or difficulty swallowing.  The swelling in his tongue has worsened since the onset of the swelling.  His wife gave him a dose of allegra with no change in his symptoms.  No improvement with over the counter medications or other home remedies. Denies other complaint or health concern today.   He is one month post op from a lung resection for malignancy (non small cell CA).  The surgery was uncomplicated.  Patient Active Problem List   Diagnosis Date Noted  . Atrial fibrillation 06/23/2012  . CKD (chronic kidney disease) stage 2, GFR 60-89 ml/min 06/19/2012  . COPD/ GOLD III 06/17/2012  . DM (diabetes mellitus) type II controlled with renal manifestation 06/17/2012  . Occlusion and stenosis of carotid artery without mention of cerebral infarction 03/20/2012  . Non-small cell carcinoma of lung, stage 1 02/10/2012  . Essential hypertension, benign   . Mixed hyperlipidemia   . Gout   . DM (diabetes mellitus), type 2 with peripheral vascular complications   . Carotid stenosis   . Cholelithiasis   . Renal insufficiency   . Renal artery stenosis     Past Medical History  Diagnosis Date  . Mixed hyperlipidemia     takes Pravastatin daily  . Gout     no meds required  . Skin cancer   . Abnormal  prostate exam   . Carotid stenosis   . Cholelithiasis   . Renal insufficiency   . Renal artery stenosis     right  . PAC (premature atrial contraction)   . PVCs (premature ventricular contractions)   . Essential hypertension, benign     takes Clonodine,Losartan,and Metoprolol and HCTZ  . Carotid stenosis right side    Dr.Early does carotid ultrasounds yrly-last one Mar 14-report in epic  . Asthma     as a child  . COPD (chronic obstructive pulmonary disease)     slight   . History of bronchitis   . Pneumonia 02/08/2012  . History of colon polyps   . Enlarged prostate     slightly   . Type II or unspecified type diabetes mellitus without mention of complication, not stated as uncontrolled     takes Metformin daily  . History of shingles     Past Surgical History  Procedure Laterality Date  . No past surgeries    . Colonoscopy    . Video assisted thoracoscopy Left 06/16/2012    Procedure: VIDEO ASSISTED THORACOSCOPY;  Surgeon: Loreli Slot, MD;  Location: [facility] OR;  Service: Thoracic;  Laterality: Left;  . Segmentecomy Left 06/16/2012    Procedure: left lower lobe superior SEGMENTECTOMY with node dissection;  Surgeon: Loreli Slot, MD;  Location: [facility] OR;  Service: Thoracic;  Laterality: Left;  left superior    History  Substance Use Topics  . Smoking status: Former Smoker -- 1.50 packs/day for 30 years    Types: Cigarettes    Quit date: 01/16/1976  . Smokeless tobacco: Never Used  . Alcohol Use: No    Family History  Problem Relation Age of Onset  . Heart disease Mother   . Cancer Father     Allergies  Allergen Reactions  . Ace Inhibitors Cough  . Codeine Other (See Comments)    GI Upset  . Indomethacin Other (See Comments)    Elevated BP    Medication list has been reviewed and updated.  Current Outpatient Prescriptions on File Prior to Visit  Medication Sig Dispense Refill  . albuterol (PROVENTIL HFA;VENTOLIN HFA) 108 (90 BASE) MCG/ACT inhaler  Inhale 1 puff into the lungs every 4 (four) hours as needed for wheezing or shortness of breath (and cough).      Marland Kitchen amiodarone (PACERONE) 200 MG tablet Take 1 tablet (200 mg total) by mouth 2 (two) times daily.  60 tablet  0  . aspirin EC 81 MG tablet Take 81 mg by mouth every evening.      . cloNIDine (CATAPRES) 0.2 MG tablet Take 0.2 mg by mouth 2 (two) times daily.      . hydrochlorothiazide (HYDRODIURIL) 25 MG tablet Take 25 mg by mouth daily.      Marland Kitchen losartan (COZAAR) 50 MG tablet Take 50 mg by mouth daily.      . metFORMIN (GLUCOPHAGE) 500 MG tablet Take 500 mg by mouth 2 (two) times daily with a meal.      . metoprolol succinate (TOPROL-XL) 100 MG 24 hr tablet Take 1 tablet (100 mg total) by mouth daily. Take with or immediately following a meal.  30 tablet  1  . niacin (NIASPAN) 500 MG CR tablet TAKE 1 TABLET (500 MG TOTAL) BY MOUTH AT BEDTIME. PATIENT NEEDS OFFICE VISIT FOR ADDITIONAL REFILLS  30 tablet  3  . oxyCODONE-acetaminophen (PERCOCET/ROXICET) 5-325 MG per tablet Take 1-2 tablets by mouth every 4 (four) hours as needed.  50 tablet  0  . pravastatin (PRAVACHOL) 20 MG tablet Take 20 mg by mouth every evening.       No current facility-administered medications on file prior to visit.    Review of Systems:  As per HPI, otherwise negative.    Physical Examination: There were no vitals filed for this visit. There were no vitals filed for this visit. There is no weight on file to calculate BMI. Ideal Body Weight:    GEN: WDWN, NAD, Non-toxic, A & O x 3 HEENT: Atraumatic, Normocephalic. Neck supple. No masses, No LAD.  Tongue is markedly swollen on the LEFT side.  No evident difficulty swallowing or breathing Ears and Nose: No external deformity. CV: RRR, No M/G/R. No JVD. No thrill. No extra heart sounds. PULM: CTA B, no wheezes, crackles, rhonchi. No retractions. No resp. distress. No accessory muscle use. ABD: S, NT, ND, +BS. No rebound. No HSM. EXTR: No c/c/e NEURO Normal  gait.  PSYCH: Normally interactive. Conversant. Not depressed or anxious appearing.  Calm demeanor.    Assessment and Plan: Angioedema likely secondary to ARB IV  Transfer to ER.  Signed,  Phillips Odor, MD

## 2012-07-17 ENCOUNTER — Telehealth: Payer: Self-pay

## 2012-07-17 ENCOUNTER — Ambulatory Visit (INDEPENDENT_AMBULATORY_CARE_PROVIDER_SITE_OTHER): Payer: Medicare Other | Admitting: Emergency Medicine

## 2012-07-17 VITALS — BP 130/60 | HR 50 | Temp 98.1°F | Resp 16 | Ht 67.5 in | Wt 161.0 lb

## 2012-07-17 DIAGNOSIS — Z5189 Encounter for other specified aftercare: Secondary | ICD-10-CM

## 2012-07-17 DIAGNOSIS — T783XXD Angioneurotic edema, subsequent encounter: Secondary | ICD-10-CM

## 2012-07-17 NOTE — Telephone Encounter (Signed)
I called and Dr Cleta Alberts spoke to patient also

## 2012-07-17 NOTE — Telephone Encounter (Signed)
Dr. Cleta Alberts   Patient was sent to ER last night with swollen tongue.  Cause was his medications.  He wants to discuss this with you.     [phone]

## 2012-07-17 NOTE — Progress Notes (Signed)
  Subjective:    Patient ID: Benjamin Patel, male    DOB: Dec 26, 1927, 79 y.o.   MRN: [id number]  HPI  Presents for follow up after an episode of angioedema yesterday which required ED visit. Left side of tongue began to swell, denies throat or facial swelling. Denies SOB or swallowing difficulty. States his speech was slightly affected. He was seen at [facility] and transported to [facility], he received several hours of treatment, the edema dissipated and he was discharged. Suspected etiology is lorsartan which he has been taking for at least 4 years.   At present, he states he "feels fine". Denies swelling. No SOB, no swallowing difficulties. Denies insect bites or stings. Denies new medications.    Review of Systems Denies: headache, chest pain, SOB, abdominal pain, urinary symptoms    Objective:   Physical Exam  BP 130/60  Pulse 50  Temp(Src) 98.1 F (36.7 C)  Resp 16  Ht 5' 7.5" (1.715 m)  Wt 161 lb (73.029 kg)  BMI 24.83 kg/m2  SpO2 97%  General: WDWN male, appears stated age, NAD, very pleasant  HEENT: normocephalic, atraumatic, sclera/conjunctiva clear, no erythema edema in perioral area, moist mucous membranes, uvula midline, posterior pharynx without erythema edema or exudate, no palpable lymphadenopathy Resp: good air entry, CTA anterior & posterior fields bilaterally  Cardiac: RRR, no murmurs rubs gallops  Extremities: moves all limbs spontaneously  Neuro: A&O x 3, CN II-XII grossly intact  Skin: no rashes, lesions        Assessment & Plan:  We'll continue him off the losartan for now .

## 2012-07-29 ENCOUNTER — Encounter: Payer: Self-pay | Admitting: Emergency Medicine

## 2012-07-29 ENCOUNTER — Ambulatory Visit (INDEPENDENT_AMBULATORY_CARE_PROVIDER_SITE_OTHER): Payer: Medicare Other | Admitting: Emergency Medicine

## 2012-07-29 VITALS — BP 130/54 | HR 46 | Temp 97.6°F | Resp 16 | Ht 68.0 in | Wt 157.0 lb

## 2012-07-29 DIAGNOSIS — E119 Type 2 diabetes mellitus without complications: Secondary | ICD-10-CM

## 2012-07-29 DIAGNOSIS — T783XXA Angioneurotic edema, initial encounter: Secondary | ICD-10-CM

## 2012-07-29 DIAGNOSIS — N186 End stage renal disease: Secondary | ICD-10-CM

## 2012-07-29 DIAGNOSIS — I4891 Unspecified atrial fibrillation: Secondary | ICD-10-CM

## 2012-07-29 LAB — GLUCOSE, POCT (MANUAL RESULT ENTRY): POC Glucose: 127 mg/dl — AB (ref 70–99)

## 2012-07-29 LAB — BASIC METABOLIC PANEL
CO2: 29 mEq/L (ref 19–32)
Calcium: 10.2 mg/dL (ref 8.4–10.5)
Glucose, Bld: 124 mg/dL — ABNORMAL HIGH (ref 70–99)
Sodium: 137 mEq/L (ref 135–145)

## 2012-07-29 NOTE — Progress Notes (Signed)
  Subjective:    Patient ID: Benjamin Patel, male    DOB: 10/15/1927, 79 y.o.   MRN: [id number]  HPI the patient has been doing well since his lung surgery. Calcified an episode of angioedema present secondary to losartan and has had no recurrent episodes of this. He is staying active. He has not been suffering from depression. His breathing has been good and he has had no chest pain    Review of Systems     Objective:   Physical Exam patient is alert and cooperative today his neck is supple his chest is clear to auscultation and percussion today his cardiac exam is regular rate without murmurs. Abdomen is soft and nontender        Results for orders placed in visit on 07/29/12  GLUCOSE, POCT (MANUAL RESULT ENTRY)      Result Value Range   POC Glucose 127 (*) 70 - 99 mg/dl  POCT GLYCOSYLATED HEMOGLOBIN (HGB A1C)      Result Value Range   Hemoglobin A1C 5.8     Assessment & Plan:  Diabetes is under excellent control. He is doing well status post surgery for lung cancer. Routine labs were done today .

## 2012-08-19 ENCOUNTER — Other Ambulatory Visit: Payer: Self-pay | Admitting: Emergency Medicine

## 2012-09-16 ENCOUNTER — Emergency Department (HOSPITAL_COMMUNITY)
Admission: EM | Admit: 2012-09-16 | Discharge: 2012-09-17 | Disposition: A | Discharge status: Home / Self Care | Payer: Medicare Other | Attending: Emergency Medicine | Admitting: Emergency Medicine

## 2012-09-16 ENCOUNTER — Encounter (HOSPITAL_COMMUNITY): Payer: Self-pay | Admitting: Emergency Medicine

## 2012-09-16 DIAGNOSIS — Z85828 Personal history of other malignant neoplasm of skin: Secondary | ICD-10-CM | POA: Insufficient documentation

## 2012-09-16 DIAGNOSIS — Z8679 Personal history of other diseases of the circulatory system: Secondary | ICD-10-CM | POA: Insufficient documentation

## 2012-09-16 DIAGNOSIS — Z87891 Personal history of nicotine dependence: Secondary | ICD-10-CM | POA: Insufficient documentation

## 2012-09-16 DIAGNOSIS — Z862 Personal history of diseases of the blood and blood-forming organs and certain disorders involving the immune mechanism: Secondary | ICD-10-CM | POA: Insufficient documentation

## 2012-09-16 DIAGNOSIS — Z860100 Personal history of colon polyps, unspecified: Secondary | ICD-10-CM | POA: Insufficient documentation

## 2012-09-16 DIAGNOSIS — T783XXA Angioneurotic edema, initial encounter: Secondary | ICD-10-CM | POA: Diagnosis not present

## 2012-09-16 DIAGNOSIS — T465X5A Adverse effect of other antihypertensive drugs, initial encounter: Secondary | ICD-10-CM | POA: Insufficient documentation

## 2012-09-16 DIAGNOSIS — Z79899 Other long term (current) drug therapy: Secondary | ICD-10-CM | POA: Diagnosis not present

## 2012-09-16 DIAGNOSIS — Z7982 Long term (current) use of aspirin: Secondary | ICD-10-CM | POA: Diagnosis not present

## 2012-09-16 DIAGNOSIS — Z85118 Personal history of other malignant neoplasm of bronchus and lung: Secondary | ICD-10-CM | POA: Insufficient documentation

## 2012-09-16 DIAGNOSIS — Z8619 Personal history of other infectious and parasitic diseases: Secondary | ICD-10-CM | POA: Diagnosis not present

## 2012-09-16 DIAGNOSIS — J449 Chronic obstructive pulmonary disease, unspecified: Secondary | ICD-10-CM | POA: Diagnosis not present

## 2012-09-16 DIAGNOSIS — Z8701 Personal history of pneumonia (recurrent): Secondary | ICD-10-CM | POA: Insufficient documentation

## 2012-09-16 DIAGNOSIS — I10 Essential (primary) hypertension: Secondary | ICD-10-CM | POA: Diagnosis not present

## 2012-09-16 DIAGNOSIS — E782 Mixed hyperlipidemia: Secondary | ICD-10-CM | POA: Insufficient documentation

## 2012-09-16 DIAGNOSIS — Z8719 Personal history of other diseases of the digestive system: Secondary | ICD-10-CM | POA: Insufficient documentation

## 2012-09-16 DIAGNOSIS — E119 Type 2 diabetes mellitus without complications: Secondary | ICD-10-CM | POA: Diagnosis not present

## 2012-09-16 DIAGNOSIS — J4489 Other specified chronic obstructive pulmonary disease: Secondary | ICD-10-CM | POA: Insufficient documentation

## 2012-09-16 DIAGNOSIS — Z87448 Personal history of other diseases of urinary system: Secondary | ICD-10-CM | POA: Diagnosis not present

## 2012-09-16 DIAGNOSIS — Z8639 Personal history of other endocrine, nutritional and metabolic disease: Secondary | ICD-10-CM | POA: Insufficient documentation

## 2012-09-16 DIAGNOSIS — Z8709 Personal history of other diseases of the respiratory system: Secondary | ICD-10-CM | POA: Diagnosis not present

## 2012-09-16 MED ORDER — PREDNISONE 20 MG PO TABS: 40.0000 mg | ORAL_TABLET | Freq: Every day | ORAL | Status: DC

## 2012-09-16 MED ORDER — METHYLPREDNISOLONE SODIUM SUCC 125 MG IJ SOLR
125.0000 mg | Freq: Once | INTRAMUSCULAR | Status: AC
  Administered 2012-09-16: 125 mg via INTRAVENOUS
  Filled 2012-09-16: qty 2

## 2012-09-16 NOTE — ED Notes (Signed)
Pt arrived to Ed from home via POV with a complaint of an allergic reaction.  Pt tongue is swollen.  Pt is able to talk and relate events.  Pt is not short of breath and airway is patent.  Pt has no rash.  Pt had similar situatuion in May post lung cancer operation. Pt appears in no apparent distress at this time

## 2012-09-16 NOTE — ED Notes (Signed)
MD at bedside. 

## 2012-09-16 NOTE — ED Provider Notes (Signed)
CSN: [id number]     Arrival date & time 09/16/12  2020 History   First MD Initiated Contact with Patient 09/16/12 2129     Chief Complaint  Patient presents with  . Allergic Reaction   (Consider location/radiation/quality/duration/timing/severity/associated sxs/prior Treatment) Patient is a 79 y.o. male presenting with allergic reaction. The history is provided by the patient.  Allergic Reaction Presenting symptoms: no difficulty swallowing and no rash    patient presents with swollen tongue. Began around 6:30 today. He has had Benadryl. He has had a previous episode in the past that was attributed to losartan. No chest pain. No Difficulty breathing. No drooling. No trouble swallowing. he is no longer on the losartan. He states it is not severe as it was a few months ago when he had another episode. He states it has been stable over the last couple hours.  Past Medical History  Diagnosis Date  . Mixed hyperlipidemia     takes Pravastatin daily  . Gout     no meds required  . Skin cancer   . Abnormal prostate exam   . Carotid stenosis   . Cholelithiasis   . Renal insufficiency   . Renal artery stenosis     right  . PAC (premature atrial contraction)   . PVCs (premature ventricular contractions)   . Essential hypertension, benign     takes Clonodine,Losartan,and Metoprolol and HCTZ  . Carotid stenosis right side    Dr.Early does carotid ultrasounds yrly-last one Mar 14-report in epic  . Asthma     as a child  . COPD (chronic obstructive pulmonary disease)     slight   . History of bronchitis   . Pneumonia 02/08/2012  . History of colon polyps   . Enlarged prostate     slightly   . Type II or unspecified type diabetes mellitus without mention of complication, not stated as uncontrolled     takes Metformin daily  . History of shingles    Past Surgical History  Procedure Laterality Date  . No past surgeries    . Colonoscopy    . Video assisted thoracoscopy Left 06/16/2012     Procedure: VIDEO ASSISTED THORACOSCOPY;  Surgeon: Loreli Slot, MD;  Location: [facility] OR;  Service: Thoracic;  Laterality: Left;  . Segmentecomy Left 06/16/2012    Procedure: left lower lobe superior SEGMENTECTOMY with node dissection;  Surgeon: Loreli Slot, MD;  Location: [facility] OR;  Service: Thoracic;  Laterality: Left;  left superior   Family History  Problem Relation Age of Onset  . Heart disease Mother   . Cancer Father    History  Substance Use Topics  . Smoking status: Former Smoker -- 1.50 packs/day for 30 years    Types: Cigarettes    Quit date: 01/16/1976  . Smokeless tobacco: Never Used  . Alcohol Use: No    Review of Systems  Constitutional: Negative for activity change and appetite change.  HENT: Positive for facial swelling. Negative for sore throat, drooling, mouth sores, trouble swallowing, neck stiffness and voice change.   Eyes: Negative for pain.  Respiratory: Negative for chest tightness and shortness of breath.   Cardiovascular: Negative for chest pain and leg swelling.  Gastrointestinal: Negative for nausea, vomiting, abdominal pain and diarrhea.  Genitourinary: Negative for flank pain.  Musculoskeletal: Negative for back pain.  Skin: Negative for rash.  Allergic/Immunologic: Negative for environmental allergies, food allergies and immunocompromised state.  Neurological: Negative for weakness, numbness and headaches.  Psychiatric/Behavioral: Negative for  behavioral problems.    Allergies  Ace inhibitors; Codeine; Indomethacin; and Losartan  Home Medications   Current Outpatient Rx  Name  Route  Sig  Dispense  Refill  . albuterol (PROVENTIL HFA;VENTOLIN HFA) 108 (90 BASE) MCG/ACT inhaler   Inhalation   Inhale 1 puff into the lungs every 4 (four) hours as needed for wheezing or shortness of breath (and cough).         Marland Kitchen aspirin EC 81 MG tablet   Oral   Take 81 mg by mouth every evening.         . cloNIDine (CATAPRES) 0.2 MG tablet    Oral   Take 0.2 mg by mouth 2 (two) times daily.         . diphenhydrAMINE (BENADRYL) 25 mg capsule   Oral   Take 1 capsule (25 mg total) by mouth every 6 (six) hours as needed for itching.   10 capsule   0   . famotidine (PEPCID) 20 MG tablet   Oral   Take 1 tablet (20 mg total) by mouth 2 (two) times daily.   20 tablet   0   . hydrochlorothiazide (HYDRODIURIL) 25 MG tablet   Oral   Take 25 mg by mouth daily.         . metFORMIN (GLUCOPHAGE) 500 MG tablet   Oral   Take 500 mg by mouth 2 (two) times daily with a meal.         . metoprolol succinate (TOPROL-XL) 100 MG 24 hr tablet      TAKE 1 TABLET BY MOUTH EVERY DAY WITH OR IMMEDIATELY FOLLOWING A MEAL   30 tablet   2   . niacin (NIASPAN) 500 MG CR tablet      TAKE 1 TABLET (500 MG TOTAL) BY MOUTH AT BEDTIME. PATIENT NEEDS OFFICE VISIT FOR ADDITIONAL REFILLS   30 tablet   3   . pravastatin (PRAVACHOL) 20 MG tablet   Oral   Take 20 mg by mouth every evening.         . predniSONE (DELTASONE) 20 MG tablet   Oral   Take 2 tablets (40 mg total) by mouth daily.   4 tablet   0    BP 201/64  Pulse 56  Temp(Src) 98.6 F (37 C) (Oral)  Resp 18  SpO2 98% Physical Exam  Constitutional: He is oriented to person, place, and time. He appears well-developed and well-nourished.  HENT:  Head: Normocephalic.  Mild angioedema of the lower lip, worse on the right side. Angioedema of right-sided tongue. No stridor. No drooling. Possible very mild angioedema posterior pharynx.  Eyes: Pupils are equal, round, and reactive to light.  Neck: Normal range of motion.  Cardiovascular: Normal rate and regular rhythm.   Pulmonary/Chest: Effort normal and breath sounds normal.  Abdominal: Soft. Bowel sounds are normal.  Musculoskeletal: Normal range of motion. He exhibits no edema.  Neurological: He is oriented to person, place, and time.  Skin: Skin is warm.    ED Course  Procedures (including critical care time) Labs  Review Labs Reviewed - No data to display Imaging Review No results found.  MDM   1. Angioedema, initial encounter    Patient with angioedema of his lip and right side of tongue. His been stable over the 3 hours of the admission. He previously had angioedema that was presumed to be from losartan, however he is no longer on it. He will follow up with allergist.    Harrold Donath  Mills Koller, MD 09/16/12 [id number]

## 2012-10-02 ENCOUNTER — Other Ambulatory Visit: Payer: Self-pay | Admitting: *Deleted

## 2012-10-11 ENCOUNTER — Other Ambulatory Visit: Payer: Self-pay | Admitting: Emergency Medicine

## 2012-10-11 ENCOUNTER — Other Ambulatory Visit: Payer: Self-pay | Admitting: Physician Assistant

## 2012-10-13 ENCOUNTER — Other Ambulatory Visit: Payer: Self-pay | Admitting: Emergency Medicine

## 2012-10-20 ENCOUNTER — Other Ambulatory Visit: Payer: Self-pay | Admitting: Emergency Medicine

## 2012-10-21 ENCOUNTER — Ambulatory Visit
Admission: RE | Admit: 2012-10-21 | Discharge: 2012-10-21 | Disposition: A | Discharge status: Home / Self Care | Payer: Medicare Other | Source: Ambulatory Visit | Attending: Thoracic Surgery (Cardiothoracic Vascular Surgery) | Admitting: Thoracic Surgery (Cardiothoracic Vascular Surgery)

## 2012-10-21 ENCOUNTER — Encounter: Payer: Self-pay | Admitting: Thoracic Surgery (Cardiothoracic Vascular Surgery)

## 2012-10-21 ENCOUNTER — Ambulatory Visit (INDEPENDENT_AMBULATORY_CARE_PROVIDER_SITE_OTHER): Payer: Medicare Other | Admitting: Thoracic Surgery (Cardiothoracic Vascular Surgery)

## 2012-10-21 VITALS — BP 129/65 | HR 60 | Resp 20 | Ht 68.0 in | Wt 159.0 lb

## 2012-10-21 DIAGNOSIS — C3490 Malignant neoplasm of unspecified part of unspecified bronchus or lung: Secondary | ICD-10-CM

## 2012-10-21 DIAGNOSIS — J438 Other emphysema: Secondary | ICD-10-CM | POA: Diagnosis not present

## 2012-10-21 DIAGNOSIS — Z09 Encounter for follow-up examination after completed treatment for conditions other than malignant neoplasm: Secondary | ICD-10-CM | POA: Diagnosis not present

## 2012-10-21 NOTE — Progress Notes (Signed)
HPI:  Mr. Righi returns today for a scheduled for month followup visit.  He is an 79 year old gentleman who had a superior segmentectomy of the left lower lobe for a stage IA non-small cell cancer back in June. He had some atrial fibrillation postoperatively but otherwise did well.  Since his last visit he says he has been doing well. He did have an episode of angioedema secondary to olmesartan which has subsequently been discontinued. He's not had any problems with his breathing. He denies wheezing, shortness of breath, cough and hemoptysis. He doesn't have any incisional pain. His weight has been stable. He saw Dr. Cleta Alberts recently and his blood sugars were well-controlled.  Past Medical History  Diagnosis Date  . Mixed hyperlipidemia     takes Pravastatin daily  . Gout     no meds required  . Skin cancer   . Abnormal prostate exam   . Carotid stenosis   . Cholelithiasis   . Renal insufficiency   . Renal artery stenosis     right  . PAC (premature atrial contraction)   . PVCs (premature ventricular contractions)   . Essential hypertension, benign     takes Clonodine,Losartan,and Metoprolol and HCTZ  . Carotid stenosis right side    Dr.Early does carotid ultrasounds yrly-last one Mar 14-report in epic  . Asthma     as a child  . COPD (chronic obstructive pulmonary disease)     slight   . History of bronchitis   . Pneumonia 02/08/2012  . History of colon polyps   . Enlarged prostate     slightly   . Type II or unspecified type diabetes mellitus without mention of complication, not stated as uncontrolled     takes Metformin daily  . History of shingles       Current Outpatient Prescriptions  Medication Sig Dispense Refill  . aspirin EC 81 MG tablet Take 81 mg by mouth every evening.      . cloNIDine (CATAPRES) 0.2 MG tablet Take 0.2 mg by mouth 2 (two) times daily.      . hydrochlorothiazide (HYDRODIURIL) 25 MG tablet Take 25 mg by mouth daily.      .  hydrochlorothiazide (HYDRODIURIL) 25 MG tablet TAKE 1 TABLET EVERY DAY  30 tablet  2  . metFORMIN (GLUCOPHAGE) 500 MG tablet Take 500 mg by mouth 2 (two) times daily with a meal.      . metoprolol succinate (TOPROL-XL) 100 MG 24 hr tablet TAKE 1 TABLET BY MOUTH EVERY DAY WITH OR IMMEDIATELY FOLLOWING A MEAL  30 tablet  2  . metoprolol succinate (TOPROL-XL) 100 MG 24 hr tablet TAKE 1 TABLET BY MOUTH EVERY DAY WITH OR IMMEDIATELY FOLLOWING A MEAL  30 tablet  1  . niacin (NIASPAN) 500 MG CR tablet TAKE 1 TABLET (500 MG TOTAL) BY MOUTH AT BEDTIME. PATIENT NEEDS OFFICE VISIT FOR ADDITIONAL REFILLS  30 tablet  3  . pravastatin (PRAVACHOL) 20 MG tablet Take 20 mg by mouth every evening.      . pravastatin (PRAVACHOL) 20 MG tablet TAKE 1 TABLET BY MOUTH EVERY DAY  30 tablet  5   No current facility-administered medications for this visit.    Physical Exam BP 129/65  Pulse 60  Resp 20  Ht 5\' 8"  (1.727 m)  Wt 159 lb (72.122 kg)  BMI 24.18 kg/m2  SpO2 39% 79 year old male in no acute distress Well-developed well-nourished Alert and oriented x3 with no focal neurologic deficits Cardiac regular rate and rhythm  Lungs clear with equal breath sounds bilaterally No cervical or suprapubic or adenopathy Incisions well healed  Diagnostic Tests: CT of chest 10/21/12 CLINICAL DATA: Superior segmentectomy of the left lower lobe for  resection of lung carcinoma, follow-up, former smoking history  EXAM:  CT CHEST WITHOUT CONTRAST  TECHNIQUE:  Multidetector CT imaging of the chest was performed following the  standard protocol without IV contrast.  COMPARISON: Chest x-ray of 07/16/2012 at CT chest 04/29/2012  FINDINGS:  Diffuse moderately severe changes of centrilobular emphysema again  are noted. The left lower lobe superior segment lesion has been  resected in the interval with postoperative changes. There is a  small nodular opacity within the anteromedial left upper lobe of 5  mm diameter which  could represent scarring, retracted somewhat due  to the segmentectomy of the superior segment of the left lower lobe,  but attention to this small nodular opacity on followup CT of the  chest is recommended. No additional pulmonary nodule is seen. No  pleural effusion is noted. The central airway is patent.  On soft tissue window images, the thyroid gland is unremarkable. On  this unenhanced study, no definite mediastinal or hilar adenopathy  is seen. Atheromatous changes are present within the aortic arch and  descending thoracic aorta. Coronary artery calcifications are noted  diffusely and cardiomegaly is stable. Multiple gallstones layer  within the gallbladder, and a nonobstructing left renal calculus is  again noted. There is a thoracic kyphosis is present with mild  diffuse degenerative change present.  IMPRESSION:  1. Resection of the superior segment left lower lobe nodule by  segmentectomy. Postoperative scarring.  2. 5 mm nodular opacity within the anteromedial left upper lobe most  likely represents scarring but attention to this area on followup CT  of the chest is recommended.  3. Diffuse centrilobular emphysema.  4. Gallstones.  5. Nonobstructing left renal calculus.  Electronically Signed  By: Dwyane Dee M.D.  On: 10/21/2012 11:48  Impression: 79 year old gentleman who is now 4 months out from a thoracoscopic superior segmentectomy for stage IA non-small cell carcinoma. He really is getting along quite well. He doesn't have any residual pain from the surgery. His exercise tolerance is good. He has not had any issues with his breathing recently.  His CT today showed a 4.8 mm nodule in the left upper lobe. He had some scarring in the left lung previously and I suspect it may have just moved positionally due to his segmentectomy, but it does bear followup. I will plan to see him back in 4 months do another CT at that time.   Plan: Return 4 months with CT of chest

## 2012-10-28 ENCOUNTER — Encounter: Payer: Self-pay | Admitting: Emergency Medicine

## 2012-10-28 ENCOUNTER — Ambulatory Visit (INDEPENDENT_AMBULATORY_CARE_PROVIDER_SITE_OTHER): Payer: Medicare Other | Admitting: Emergency Medicine

## 2012-10-28 VITALS — BP 148/68 | HR 61 | Temp 97.9°F | Resp 16 | Ht 68.5 in | Wt 163.6 lb

## 2012-10-28 DIAGNOSIS — I2510 Atherosclerotic heart disease of native coronary artery without angina pectoris: Secondary | ICD-10-CM

## 2012-10-28 DIAGNOSIS — C3491 Malignant neoplasm of unspecified part of right bronchus or lung: Secondary | ICD-10-CM

## 2012-10-28 DIAGNOSIS — E7800 Pure hypercholesterolemia, unspecified: Secondary | ICD-10-CM

## 2012-10-28 DIAGNOSIS — E119 Type 2 diabetes mellitus without complications: Secondary | ICD-10-CM

## 2012-10-28 DIAGNOSIS — Z23 Encounter for immunization: Secondary | ICD-10-CM | POA: Diagnosis not present

## 2012-10-28 DIAGNOSIS — C3490 Malignant neoplasm of unspecified part of unspecified bronchus or lung: Secondary | ICD-10-CM

## 2012-10-28 NOTE — Progress Notes (Signed)
  Subjective:    Patient ID: Benjamin Patel, male    DOB: [birth year], 79 y.o.   MRN: [id number]  HPI patient enters for his three-month followup. Of note he recently saw his thoracic surgeon. CT showed a 5 mm lesion at the margin of the surgery. His surgery was for non-small cell carcinoma. Overall he feels well he's had no chest pain or shortness of breath. He has had one episode of angioedema since he was last year. This resolved without incident. He has been to the allergist in the past but no specific allergens were found.    Review of Systems     Objective:   Physical Exam HEENT exam is unremarkable. Neck is supple. Chest is clear. Heart regular rate no murmurs. Extremity exam reveals normal sensation and pulses Results for orders placed in visit on 10/28/12  GLUCOSE, POCT (MANUAL RESULT ENTRY)      Result Value Range   POC Glucose 135 (*) 70 - 99 mg/dl  POCT GLYCOSYLATED HEMOGLOBIN (HGB A1C)      Result Value Range   Hemoglobin A1C 6.2            Assessment & Plan:   Diabetes under excellent control. To ER if angioedema recurrence

## 2012-11-08 ENCOUNTER — Other Ambulatory Visit: Payer: Self-pay | Admitting: Emergency Medicine

## 2012-12-22 ENCOUNTER — Encounter: Payer: Self-pay | Admitting: Cardiology

## 2012-12-22 ENCOUNTER — Ambulatory Visit (INDEPENDENT_AMBULATORY_CARE_PROVIDER_SITE_OTHER): Payer: Medicare Other | Admitting: Cardiology

## 2012-12-22 VITALS — BP 138/68 | HR 55 | Ht 68.5 in | Wt 168.0 lb

## 2012-12-22 DIAGNOSIS — I2510 Atherosclerotic heart disease of native coronary artery without angina pectoris: Secondary | ICD-10-CM | POA: Diagnosis not present

## 2012-12-22 DIAGNOSIS — C3490 Malignant neoplasm of unspecified part of unspecified bronchus or lung: Secondary | ICD-10-CM | POA: Diagnosis not present

## 2012-12-22 DIAGNOSIS — C3492 Malignant neoplasm of unspecified part of left bronchus or lung: Secondary | ICD-10-CM

## 2012-12-22 DIAGNOSIS — I10 Essential (primary) hypertension: Secondary | ICD-10-CM

## 2012-12-22 DIAGNOSIS — I4891 Unspecified atrial fibrillation: Secondary | ICD-10-CM

## 2012-12-22 DIAGNOSIS — E782 Mixed hyperlipidemia: Secondary | ICD-10-CM

## 2012-12-22 DIAGNOSIS — I2584 Coronary atherosclerosis due to calcified coronary lesion: Secondary | ICD-10-CM | POA: Diagnosis not present

## 2012-12-22 LAB — LIPID PANEL
Cholesterol: 181 mg/dL (ref 0–200)
Total CHOL/HDL Ratio: 5
Triglycerides: 317 mg/dL — ABNORMAL HIGH (ref 0.0–149.0)
VLDL: 63.4 mg/dL — ABNORMAL HIGH (ref 0.0–40.0)

## 2012-12-22 NOTE — Patient Instructions (Addendum)
Your physician recommends that you continue on your current medications as directed. Please refer to the Current Medication list given to you today.  Your physician recommends that you labs today: Lipid  Your physician wants you to follow-up in: 1 year with Dr. Anne Fu. You will receive a reminder letter in the mail two months in advance. If you don't receive a letter, please call our office to schedule the follow-up appointment.

## 2012-12-22 NOTE — Progress Notes (Signed)
[address], [address] [place], [place]  [address] Phone: [phone] Fax:  [phone]  Date:  12/22/2012   ID:  [name], DOB [birth year], MRN [id number]  PCP:  Lucilla Edin, MD   History of Present Illness: Benjamin Patel is a 79 y.o. male with comorbidities include hypertension, peripheral vascular disease (Dr. Arbie Cookey) including both carotid artery disease as well as renal artery stenosis (moderate), hyperlipidemia. He had low risk nuclear stress test on 05/16/12 prior to Dr. Dorris Fetch lung mass removal surgery.   He denies any significant chest discomfort, orthopnea, syncope, bleeding, stroke like symptoms. He quit tobacco abuse and 1978, newly diagnosed COPD. In January of this year developed a persistent cough. Nonproductive at first but then became productive of yellow sputum. Chest x-ray showed a possible left lung mass. CT of the chest was done and confirmed mass in the left lower lobe. Initially treated with antibiotics and the nodule decreased in size. However, he had a followup CT which showed mass has increased in size and a PET scan was done and showed hypermetabolic activity. There was no evidence of regional or distant metastasis. He remains quite active. He has 2 flights of stairs at home and couldn't walk without getting short of breath. No chest pain. No hemoptysis, + 10 pound weight loss, change in exercise. His mother had heart disease and father had cancer. The lung mass is highly suspicious for primary bronchogenic carcinoma. Approximately 2 cm. DLCO 35%. FEV1 one of 0.9. Severe coronary artery calcification was noted on CT scan. Left VATS procedure with superior segmentectomy planned    Wt Readings from Last 3 Encounters:  12/22/12 168 lb (76.204 kg)  10/28/12 163 lb 9.6 oz (74.208 kg)  10/21/12 159 lb (72.122 kg)     Past Medical History  Diagnosis Date  . Mixed hyperlipidemia     takes Pravastatin daily  . Gout     no meds required  . Skin  cancer   . Abnormal prostate exam   . Carotid stenosis   . Cholelithiasis   . Renal insufficiency   . Renal artery stenosis     right  . PAC (premature atrial contraction)   . PVCs (premature ventricular contractions)   . Essential hypertension, benign     takes Clonodine,Losartan,and Metoprolol and HCTZ  . Carotid stenosis right side    Dr.Early does carotid ultrasounds yrly-last one Mar 14-report in epic  . Asthma     as a child  . COPD (chronic obstructive pulmonary disease)     slight   . History of bronchitis   . Pneumonia 02/08/2012  . History of colon polyps   . Enlarged prostate     slightly   . Type II or unspecified type diabetes mellitus without mention of complication, not stated as uncontrolled     takes Metformin daily  . History of shingles     Past Surgical History  Procedure Laterality Date  . No past surgeries    . Colonoscopy    . Video assisted thoracoscopy Left 06/16/2012    Procedure: VIDEO ASSISTED THORACOSCOPY;  Surgeon: Loreli Slot, MD;  Location: [facility] OR;  Service: Thoracic;  Laterality: Left;  . Segmentecomy Left 06/16/2012    Procedure: left lower lobe superior SEGMENTECTOMY with node dissection;  Surgeon: Loreli Slot, MD;  Location: [facility] OR;  Service: Thoracic;  Laterality: Left;  left superior    Current Outpatient Prescriptions  Medication Sig Dispense  Refill  . aspirin EC 81 MG tablet Take 81 mg by mouth every evening.      . cloNIDine (CATAPRES) 0.2 MG tablet Take 0.2 mg by mouth 2 (two) times daily.      . cloNIDine (CATAPRES) 0.2 MG tablet TAKE 1 TABLET BY MOUTH TWICE A DAY  60 tablet  5  . hydrochlorothiazide (HYDRODIURIL) 25 MG tablet Take 25 mg by mouth daily.      . metFORMIN (GLUCOPHAGE) 500 MG tablet Take 500 mg by mouth 2 (two) times daily with a meal.      . metoprolol succinate (TOPROL-XL) 100 MG 24 hr tablet TAKE 1 TABLET BY MOUTH EVERY DAY WITH OR IMMEDIATELY FOLLOWING A MEAL  30 tablet  1  . metoprolol succinate  (TOPROL-XL) 100 MG 24 hr tablet TAKE 1 TABLET BY MOUTH EVERY DAY WITH OR IMMEDIATELY FOLLOWING A MEAL  30 tablet  2  . niacin (NIASPAN) 500 MG CR tablet TAKE 1 TABLET AT BEDTIME  30 tablet  5  . pravastatin (PRAVACHOL) 20 MG tablet Take 20 mg by mouth every evening.      . pravastatin (PRAVACHOL) 20 MG tablet TAKE 1 TABLET BY MOUTH EVERY DAY  30 tablet  5   No current facility-administered medications for this visit.    Allergies:    Allergies  Allergen Reactions  . Ace Inhibitors Cough  . Codeine Other (See Comments)    GI Upset  . Indomethacin Other (See Comments)    Elevated BP  . Losartan Swelling    ANGIOEDEMA/     Social History:  The patient  reports that he quit smoking about 36 years ago. His smoking use included Cigarettes. He has a 45 pack-year smoking history. He has never used smokeless tobacco. He reports that he does not drink alcohol or use illicit drugs.   ROS:  Please see the history of present illness.   Denies any bleeding, syncope, orthopnea, PND   PHYSICAL EXAM: VS:  BP 150/76  Pulse 55  Ht 5' 8.5" (1.74 m)  Wt 168 lb (76.204 kg)  BMI 25.17 kg/m2 Well nourished, well developed, in no acute distress HEENT: normalsoft bruit Bilateral.  Neck: no JVD Cardiac:  normal S1, S2; RRR; no murmur Lungs:  clear to auscultation bilaterally, no wheezing, rhonchi or rales Abd: soft, nontender, no hepatomegaly Ext: no edema Skin: warm and dry Neuro: no focal abnormalities noted  EKG:  Sinus bradycardia rate 55, rightward axis, borderline LVH   PAC  ASSESSMENT AND PLAN:  1. Coronary artery calcifications-continue with aggressive secondary prevention. Nuclear stress test was reassuring with low risk, no ischemia, normal EF. He has not had any active anginal symptoms. 2. Stage IA non-small cell carcinoma-Dr. Dorris Fetch. CT scan, 4.8 mm nodule left upper lobe. They will be monitoring with another CT scan in 4 months. 3. Hyperlipidemia-continue current cholesterol  regimen. Since it has been since October of 2013 last check, I will go ahead and repeat today. I would like to see his LDL certainly less than 100, less than 70 would be great given his coronary artery calcification. 4. Carotid artery disease-bruits appreciated on exam. Followed by Dr. Arbie Cookey.  Signed, Donato Schultz, MD [facility]  12/22/2012 2:28 PM

## 2012-12-24 NOTE — Progress Notes (Signed)
Labs faxed to patient.

## 2013-01-14 ENCOUNTER — Ambulatory Visit (INDEPENDENT_AMBULATORY_CARE_PROVIDER_SITE_OTHER): Payer: Medicare Other | Admitting: Emergency Medicine

## 2013-01-14 VITALS — BP 118/72 | HR 87 | Temp 98.5°F | Resp 17 | Ht 68.5 in | Wt 166.0 lb

## 2013-01-14 DIAGNOSIS — R0982 Postnasal drip: Secondary | ICD-10-CM

## 2013-01-14 DIAGNOSIS — R05 Cough: Secondary | ICD-10-CM

## 2013-01-14 LAB — POCT INFLUENZA A/B: Influenza B, POC: NEGATIVE

## 2013-01-14 MED ORDER — AMOXICILLIN 875 MG PO TABS: 875.0000 mg | ORAL_TABLET | Freq: Two times a day (BID) | ORAL | Status: DC

## 2013-01-14 NOTE — Progress Notes (Signed)
Subjective:    Patient ID: Benjamin Patel, male    DOB: 1927-11-21, 79 y.o.   MRN: [id number]  HPI Scribed for Lesle Chris MD, the patient was seen in room 11. This chart was scribed by Lewanda Rife, ED scribe. Patient's care was started at 3:51 PM  HPI Comments: Benjamin Patel is a 79 y.o. male who presents to the [facility] with PMHx of lung cancer remission complaining of worsening rhinorrhea onset 4 days. Reports associated post-nasal drip, and moderate non-productive cough. Reports taking Mucinex today with mild relief of symptoms. Denies any aggravating factors. Denies associated sinus tenderness, sore throat, shortness of breath, and fever.  Past Medical History  Diagnosis Date  . Mixed hyperlipidemia     takes Pravastatin daily  . Gout     no meds required  . Skin cancer   . Abnormal prostate exam   . Carotid stenosis   . Cholelithiasis   . Renal insufficiency   . Renal artery stenosis     right  . PAC (premature atrial contraction)   . PVCs (premature ventricular contractions)   . Essential hypertension, benign     takes Clonodine,Losartan,and Metoprolol and HCTZ  . Carotid stenosis right side    Dr.Early does carotid ultrasounds yrly-last one Mar 14-report in epic  . Asthma     as a child  . COPD (chronic obstructive pulmonary disease)     slight   . History of bronchitis   . Pneumonia 02/08/2012  . History of colon polyps   . Enlarged prostate     slightly   . Type II or unspecified type diabetes mellitus without mention of complication, not stated as uncontrolled     takes Metformin daily  . History of shingles     Past Surgical History  Procedure Laterality Date  . No past surgeries    . Colonoscopy    . Video assisted thoracoscopy Left 06/16/2012    Procedure: VIDEO ASSISTED THORACOSCOPY;  Surgeon: Loreli Slot, MD;  Location: [facility] OR;  Service: Thoracic;  Laterality: Left;  . Segmentecomy Left 06/16/2012    Procedure: left  lower lobe superior SEGMENTECTOMY with node dissection;  Surgeon: Loreli Slot, MD;  Location: [facility] OR;  Service: Thoracic;  Laterality: Left;  left superior    Family History  Problem Relation Age of Onset  . Heart disease Mother   . Cancer Father     History   Social History  . Marital Status: Married    Spouse Name: N/A    Number of Children: 3  . Years of Education: N/A   Occupational History  . retired     Systems developer   Social History Main Topics  . Smoking status: Former Smoker -- 1.50 packs/day for 30 years    Types: Cigarettes    Quit date: 01/16/1976  . Smokeless tobacco: Never Used  . Alcohol Use: No  . Drug Use: No  . Sexual Activity: Not on file   Other Topics Concern  . Not on file   Social History Narrative  . No narrative on file    Allergies  Allergen Reactions  . Ace Inhibitors Cough  . Codeine Other (See Comments)    GI Upset  . Indomethacin Other (See Comments)    Elevated BP  . Losartan Swelling    ANGIOEDEMA/     Patient Active Problem List   Diagnosis Date Noted  . Coronary artery calcification 12/22/2012  . Atrial  fibrillation 06/23/2012  . CKD (chronic kidney disease) stage 2, GFR 60-89 ml/min 06/19/2012  . COPD/ GOLD III 06/17/2012  . DM (diabetes mellitus) type II controlled with renal manifestation 06/17/2012  . Occlusion and stenosis of carotid artery without mention of cerebral infarction 03/20/2012  . Non-small cell carcinoma of lung, stage 1 02/10/2012  . Essential hypertension, benign   . Mixed hyperlipidemia   . Gout   . DM (diabetes mellitus), type 2 with peripheral vascular complications   . Carotid stenosis   . Cholelithiasis   . Renal insufficiency   . Renal artery stenosis       Review of Systems  Constitutional: Negative for fever.  HENT: Positive for postnasal drip and rhinorrhea. Negative for sinus pressure.   Respiratory: Positive for cough.        Objective:   Physical Exam  Physical  Exam  Nursing note and vitals reviewed. Constitutional: He is oriented to person, place, and time. He appears well-developed and well-nourished. No distress.  HENT: No frontal or maxillary sinus tenderness. Clear nasal drainage noted, with mild mucosal edema. Oropharynx is clear. Post oropharynx is without exudates, erythema, or edema. Uvula is midline. No uvula swelling. Mucus membranes are moist.  Head: Normocephalic and atraumatic.  Eyes: EOM are normal.  Neck: Neck supple. No tracheal deviation present. No cervical lymphadenopathy.  Cardiovascular: Normal rate.   Pulmonary/Chest: Effort normal. No respiratory distress. Lung fields clear. No rales, rhonchi, or wheezes. Musculoskeletal: Normal range of motion.  Neurological: He is alert and oriented to person, place, and time.  Skin: Skin is warm and dry.  Psychiatric: He has a normal mood and affect. His behavior is normal.   DIAGNOSTIC STUDIES: Oxygen Saturation is 97% on room air, normal by my interpretation.    COORDINATION OF CARE:  Nursing notes reviewed. Vital signs reviewed. Initial pt interview and examination performed.   3:57 PM-Discussed work up plan with pt at bedside, which includes Flu nasal swab. Pt agrees with plan.  Results for orders placed in visit on 01/14/13  POCT INFLUENZA A/B      Result Value Range   Influenza A, POC Negative     Influenza B, POC Negative     Treatment plan initiated:Medications - No data to display   Initial diagnostic testing ordered.         Assessment & Plan:  She will be on Mucinex twice a day. He will force fluids. We'll cover him with amoxicillin twice a day.

## 2013-01-19 ENCOUNTER — Other Ambulatory Visit: Payer: Self-pay | Admitting: Physician Assistant

## 2013-02-10 ENCOUNTER — Ambulatory Visit: Payer: Medicare Other | Admitting: Emergency Medicine

## 2013-02-10 ENCOUNTER — Ambulatory Visit (INDEPENDENT_AMBULATORY_CARE_PROVIDER_SITE_OTHER): Payer: Medicare Other | Admitting: Emergency Medicine

## 2013-02-10 ENCOUNTER — Encounter: Payer: Self-pay | Admitting: Emergency Medicine

## 2013-02-10 VITALS — BP 110/56 | HR 57 | Temp 97.7°F | Resp 16 | Ht 68.0 in | Wt 163.2 lb

## 2013-02-10 DIAGNOSIS — C3490 Malignant neoplasm of unspecified part of unspecified bronchus or lung: Secondary | ICD-10-CM

## 2013-02-10 DIAGNOSIS — E1129 Type 2 diabetes mellitus with other diabetic kidney complication: Secondary | ICD-10-CM

## 2013-02-10 DIAGNOSIS — I779 Disorder of arteries and arterioles, unspecified: Secondary | ICD-10-CM | POA: Diagnosis not present

## 2013-02-10 DIAGNOSIS — E119 Type 2 diabetes mellitus without complications: Secondary | ICD-10-CM | POA: Insufficient documentation

## 2013-02-10 DIAGNOSIS — I739 Peripheral vascular disease, unspecified: Secondary | ICD-10-CM

## 2013-02-10 LAB — POCT GLYCOSYLATED HEMOGLOBIN (HGB A1C): HEMOGLOBIN A1C: 6.2

## 2013-02-10 LAB — GLUCOSE, POCT (MANUAL RESULT ENTRY): POC Glucose: 157 mg/dl — AB (ref 70–99)

## 2013-02-10 NOTE — Progress Notes (Addendum)
Subjective:  This chart was scribed for Arlyss Queen, MD by Roxan Diesel, ED scribe.  This patient was seen in Room 22 and the patient's care was started at 8:35 AM.   Patient ID: Benjamin Patel, male    DOB: 07/18/1927, 80 y.o.   MRN: [id number]   Chief Complaint  Patient presents with  . Diabetes  . Hyperlipidemia    HPI   HPI Comments: Benjamin Patel is a 80 y.o. male who presents to [facility] for a regular checkup.    Pt states he is feeling well.  He received a segmentectomy in June 2014.  He states his breathing has been good.  He has a follow-up with his surgeon every 4 months and next f/u is next month.  He has neck looked at once/year and his neck appointment is in either March or May.  He receives an ultrasound at each f/u appointment.  He states his sugar has been running from the 130s-140s and on one occasion down to 120.  He denies numbness or other new issues in his feet.  He states he has been trying to eat well although he does sometimes eat white potatoes and carbs.  His BP has been well-controlled.     Patient Active Problem List   Diagnosis Date Noted  . Coronary artery calcification 12/22/2012  . Atrial fibrillation 06/23/2012  . CKD (chronic kidney disease) stage 2, GFR 60-89 ml/min 06/19/2012  . COPD/ GOLD III 06/17/2012  . DM (diabetes mellitus) type II controlled with renal manifestation 06/17/2012  . Occlusion and stenosis of carotid artery without mention of cerebral infarction 03/20/2012  . Non-small cell carcinoma of lung, stage 1 02/10/2012  . Essential hypertension, benign   . Mixed hyperlipidemia   . Gout   . DM (diabetes mellitus), type 2 with peripheral vascular complications   . Carotid stenosis   . Cholelithiasis   . Renal insufficiency   . Renal artery stenosis     Past Medical History  Diagnosis Date  . Mixed hyperlipidemia     takes Pravastatin daily  . Gout     no meds required  . Skin cancer   . Abnormal prostate exam     . Carotid stenosis   . Cholelithiasis   . Renal insufficiency   . Renal artery stenosis     right  . PAC (premature atrial contraction)   . PVCs (premature ventricular contractions)   . Essential hypertension, benign     takes Clonodine,Losartan,and Metoprolol and HCTZ  . Carotid stenosis right side    Dr.Early does carotid ultrasounds yrly-last one Mar 14-report in epic  . Asthma     as a child  . COPD (chronic obstructive pulmonary disease)     slight   . History of bronchitis   . Pneumonia 02/08/2012  . History of colon polyps   . Enlarged prostate     slightly   . Type II or unspecified type diabetes mellitus without mention of complication, not stated as uncontrolled     takes Metformin daily  . History of shingles     Past Surgical History  Procedure Laterality Date  . No past surgeries    . Colonoscopy    . Video assisted thoracoscopy Left 06/16/2012    Procedure: VIDEO ASSISTED THORACOSCOPY;  Surgeon: [name], MD;  Location: [place];  Service: Thoracic;  Laterality: Left;  . Segmentecomy Left 06/16/2012    Procedure: left lower lobe superior SEGMENTECTOMY with node dissection;  Surgeon: [name], MD;  Location: [place];  Service: Thoracic;  Laterality: Left;  left superior    Prior to Admission medications   Medication Sig Start Date End Date Taking? Authorizing Provider  amoxicillin (AMOXIL) 875 MG tablet Take 1 tablet (875 mg total) by mouth 2 (two) times daily. 01/14/13   Darlyne Russian, MD  aspirin EC 81 MG tablet Take 81 mg by mouth every evening.    Historical Provider, MD  cloNIDine (CATAPRES) 0.2 MG tablet Take 0.2 mg by mouth 2 (two) times daily.    Historical Provider, MD  cloNIDine (CATAPRES) 0.2 MG tablet TAKE 1 TABLET BY MOUTH TWICE A DAY 11/08/12   Darlyne Russian, MD  hydrochlorothiazide (HYDRODIURIL) 25 MG tablet Take 25 mg by mouth daily.    Historical Provider, MD  hydrochlorothiazide (HYDRODIURIL) 25 MG tablet TAKE 1 TABLET BY MOUTH  EVERY DAY 01/19/13   Theda Sers, PA-C  metFORMIN (GLUCOPHAGE) 500 MG tablet Take 500 mg by mouth 2 (two) times daily with a meal.    Historical Provider, MD  metoprolol succinate (TOPROL-XL) 100 MG 24 hr tablet TAKE 1 TABLET BY MOUTH EVERY DAY WITH OR IMMEDIATELY FOLLOWING A MEAL 10/11/12   Mancel Bale, PA-C  metoprolol succinate (TOPROL-XL) 100 MG 24 hr tablet TAKE 1 TABLET BY MOUTH EVERY DAY WITH OR IMMEDIATELY FOLLOWING A MEAL 11/08/12   Darlyne Russian, MD  niacin (NIASPAN) 500 MG CR tablet TAKE 1 TABLET AT BEDTIME 11/08/12   Darlyne Russian, MD  pravastatin (PRAVACHOL) 20 MG tablet Take 20 mg by mouth every evening.    Historical Provider, MD  pravastatin (PRAVACHOL) 20 MG tablet TAKE 1 TABLET BY MOUTH EVERY DAY 10/11/12   Mancel Bale, PA-C      Review of Systems  Constitutional: Negative for fever and appetite change.  Respiratory: Negative for shortness of breath.         Objective:   Physical Exam CONSTITUTIONAL: Well developed/well nourished HEAD: Normocephalic/atraumatic EYES: EOMI/PERRL ENMT: Mucous membranes moist NECK: Patient has bilateral carotid bruits SPINE:entire spine nontender CV: S1/S2 noted, no murmurs/rubs/gallops noted LUNGS: Lungs are clear to auscultation bilaterally, no apparent distress ABDOMEN: soft, nontender, no rebound or guarding GU:no cva tenderness NEURO: Pt is awake/alert, moves all extremitiesx4 EXTREMITIES: pulses normal, full ROM. Pulses are 2+ he has normal sensation. SKIN: warm, color normal PSYCH: no abnormalities of mood noted  Results for orders placed in visit on 02/10/13  GLUCOSE, POCT (MANUAL RESULT ENTRY)      Result Value Range   POC Glucose 157 (*) 70 - 99 mg/dl  POCT GLYCOSYLATED HEMOGLOBIN (HGB A1C)      Result Value Range   Hemoglobin A1C 6.2          Assessment & Plan:   Sugars perfect. We'll recheck in 3 months and do his lipid panel at that time see if he is improving with regards to his triglyceride. He will  continue his follow up appointments with his long surgeon, vascular surgeon, and cardiology   I personally performed the services described in this documentation, which was scribed in my presence. The recorded information has been reviewed and is accurate.

## 2013-02-13 ENCOUNTER — Other Ambulatory Visit: Payer: Self-pay | Admitting: Neurosurgery

## 2013-02-13 DIAGNOSIS — I6529 Occlusion and stenosis of unspecified carotid artery: Secondary | ICD-10-CM

## 2013-02-16 ENCOUNTER — Other Ambulatory Visit: Payer: Self-pay

## 2013-02-16 DIAGNOSIS — D381 Neoplasm of uncertain behavior of trachea, bronchus and lung: Secondary | ICD-10-CM

## 2013-02-18 ENCOUNTER — Other Ambulatory Visit: Payer: Self-pay | Admitting: Physician Assistant

## 2013-02-18 ENCOUNTER — Other Ambulatory Visit: Payer: Self-pay | Admitting: Emergency Medicine

## 2013-03-10 ENCOUNTER — Other Ambulatory Visit: Payer: Self-pay

## 2013-03-10 ENCOUNTER — Ambulatory Visit: Payer: Medicare Other | Admitting: Thoracic Surgery (Cardiothoracic Vascular Surgery)

## 2013-03-19 ENCOUNTER — Other Ambulatory Visit: Payer: Self-pay | Admitting: Physician Assistant

## 2013-03-20 ENCOUNTER — Encounter: Payer: Self-pay | Admitting: Family

## 2013-03-23 ENCOUNTER — Ambulatory Visit (INDEPENDENT_AMBULATORY_CARE_PROVIDER_SITE_OTHER): Payer: Medicare Other | Admitting: Family

## 2013-03-23 ENCOUNTER — Encounter: Payer: Self-pay | Admitting: Family

## 2013-03-23 ENCOUNTER — Ambulatory Visit (HOSPITAL_COMMUNITY)
Admission: RE | Admit: 2013-03-23 | Discharge: 2013-03-23 | Disposition: A | Discharge status: Home / Self Care | Payer: Medicare Other | Source: Ambulatory Visit | Attending: Family | Admitting: Family

## 2013-03-23 VITALS — BP 158/72 | HR 58 | Resp 16 | Ht 68.5 in | Wt 167.0 lb

## 2013-03-23 DIAGNOSIS — I6529 Occlusion and stenosis of unspecified carotid artery: Secondary | ICD-10-CM

## 2013-03-23 NOTE — Progress Notes (Addendum)
Established Carotid Patient   History of Present Illness  Benjamin Patel is a 80 y.o. male patient of Dr. Donnetta Hutching who has known carotid stenosis. He returns today for routine surveillance.  Patient has not had previous carotid artery intervention. He had a painful left knee for a few days, has resolved.  Patient has Negative history of TIA or stroke symptom.  The patient denies amaurosis fugax or monocular blindness.  The patient  denies facial drooping.  Pt. denies hemiplegia.  The patient denies receptive or expressive aphasia.  Pt. denies extremity weakness.  Patient  reports New Medical or Surgical History: wedge resection left lung for cancer, June, 2014.  Pt Diabetic: Yes, he states A1C was 6.2 in Jan., 2015 Pt smoker: former smoker, quit in 1978  Pt meds include: Statin : Yes ASA: Yes Other anticoagulants/antiplatelets: no   Past Medical History  Diagnosis Date  . Mixed hyperlipidemia     takes Pravastatin daily  . Gout     no meds required  . Abnormal prostate exam   . Carotid stenosis   . Cholelithiasis   . Renal insufficiency   . Renal artery stenosis     right  . PAC (premature atrial contraction)   . PVCs (premature ventricular contractions)   . Essential hypertension, benign     takes Clonodine,Losartan,and Metoprolol and HCTZ  . Carotid stenosis right side    Dr.Early does carotid ultrasounds yrly-last one Mar 14-report in epic  . Asthma     as a child  . COPD (chronic obstructive pulmonary disease)     slight   . History of bronchitis   . Pneumonia 02/08/2012  . History of colon polyps   . Enlarged prostate     slightly   . Type II or unspecified type diabetes mellitus without mention of complication, not stated as uncontrolled     takes Metformin daily  . History of shingles   . Skin cancer     Lung Ca- Left Lung  . Arthritis     Gout     Social History History  Substance Use Topics  . Smoking status: Former Smoker -- 1.50 packs/day for 30  years    Types: Cigarettes    Quit date: 01/16/1976  . Smokeless tobacco: Never Used  . Alcohol Use: No    Family History Family History  Problem Relation Age of Onset  . Heart disease Mother   . Diabetes Mother     AKA  Right   . Hypertension Mother   . Varicose Veins Mother   . Kidney disease Mother     One Kidney  . Cancer Father     Lung    Surgical History Past Surgical History  Procedure Laterality Date  . No past surgeries    . Colonoscopy    . Video assisted thoracoscopy Left 06/16/2012    Procedure: VIDEO ASSISTED THORACOSCOPY;  Surgeon: [name], MD;  Location: [place];  Service: Thoracic;  Laterality: Left;  . Segmentecomy Left 06/16/2012    Procedure: left lower lobe superior SEGMENTECTOMY with node dissection;  Surgeon: [name], MD;  Location: [place];  Service: Thoracic;  Laterality: Left;  left superior    Allergies  Allergen Reactions  . Ace Inhibitors Cough  . Codeine Other (See Comments)    GI Upset  . Indomethacin Other (See Comments)    Elevated BP  . Losartan Swelling    ANGIOEDEMA/     Current Outpatient Prescriptions  Medication  Sig Dispense Refill  . aspirin EC 81 MG tablet Take 81 mg by mouth every evening.      . cloNIDine (CATAPRES) 0.2 MG tablet Take 0.2 mg by mouth 2 (two) times daily.      . cloNIDine (CATAPRES) 0.2 MG tablet TAKE 1 TABLET BY MOUTH TWICE A DAY  60 tablet  5  . hydrochlorothiazide (HYDRODIURIL) 25 MG tablet Take 25 mg by mouth daily.      . hydrochlorothiazide (HYDRODIURIL) 25 MG tablet TAKE 1 TABLET BY MOUTH EVERY DAY  30 tablet  5  . metFORMIN (GLUCOPHAGE) 500 MG tablet Take 500 mg by mouth 2 (two) times daily with a meal.      . metFORMIN (GLUCOPHAGE) 500 MG tablet TAKE 1 TABLET BY MOUTH TWICE A DAY  60 tablet  4  . metoprolol succinate (TOPROL-XL) 100 MG 24 hr tablet TAKE 1 TABLET BY MOUTH EVERY DAY WITH OR IMMEDIATELY FOLLOWING A MEAL  30 tablet  1  . metoprolol succinate (TOPROL-XL) 100 MG 24 hr  tablet TAKE 1 TABLET BY MOUTH EVERY DAY WITH OR IMMEDIATELY FOLLOWING A MEAL  30 tablet  5  . niacin (NIASPAN) 500 MG CR tablet TAKE 1 TABLET AT BEDTIME  30 tablet  5  . pravastatin (PRAVACHOL) 20 MG tablet Take 20 mg by mouth every evening.      . pravastatin (PRAVACHOL) 20 MG tablet TAKE 1 TABLET BY MOUTH EVERY DAY  30 tablet  5   No current facility-administered medications for this visit.    Review of Systems : See HPI for pertinent positives and negatives.  Physical Examination  There were no vitals filed for this visit.  General: WDWN male in NAD GAIT: normal Eyes: PERRLA Pulmonary:  Non-labored, CTAB, Negative  Rales, Negative rhonchi, & Negative wheezing.  Cardiac: regular Rhythm ,  Negative detected murmur.  VASCULAR EXAM Carotid Bruits Left Right   Positive Positive    Aorta is not palpable. Radial pulses are 2+ palpable and equal.                                                                                                                            LE Pulses LEFT RIGHT       POPLITEAL  not palpable   not palpable       POSTERIOR TIBIAL   palpable    palpable        DORSALIS PEDIS      ANTERIOR TIBIAL  palpable   palpable     Gastrointestinal: soft, nontender, BS WNL, no r/g,  negative masses.  Musculoskeletal: Negative muscle atrophy/wasting. M/S 5/5 throughout, Extremities without ischemic changes.  Neurologic: A&O X 3; Appropriate Affect ; SENSATION ;normal;  Speech is normal CN 2-12 intact, Pain and light touch intact in extremities, Motor exam as listed above.   Non-Invasive Vascular Imaging CAROTID DUPLEX 03/23/2013   CEREBROVASCULAR DUPLEX EVALUATION    INDICATION: Carotid artery disease    PREVIOUS INTERVENTION(S):  None    DUPLEX EXAM: Carotid duplex    RIGHT  LEFT  Peak Systolic Velocities (cm/s) End Diastolic Velocities (cm/s) Plaque LOCATION Peak Systolic Velocities (cm/s) End Diastolic Velocities (cm/s) Plaque  68 11 HT CCA PROXIMAL  89 11 HT  73 9 HT CCA MID 105 15 HT  312 26 HT CCA DISTAL 286 26 HT  317 18 HT ECA 344 16 HT  164 24 HT ICA PROXIMAL 220 17 HT  112 19 - ICA MID 106 2 HT  99 17 - ICA DISTAL 89 21 HT    N/A ICA / CCA Ratio (PSV) N/A  Antegrade Vertebral Flow Antegrade  270 Brachial Systolic Pressure (mmHg) 350  Triphasic Brachial Artery Waveforms Triphasic    Plaque Morphology:  HM = Homogeneous, HT = Heterogeneous, CP = Calcific Plaque, SP = Smooth Plaque, IP = Irregular Plaque     ADDITIONAL FINDINGS: Significant plaque in the mid to distal common carotid artery and bifurcation bilaterally.    IMPRESSION: 1. 1 - 39 % internal carotid artery stenosis bilaterally, higher end or range. 2. Bilateral external carotid artery stenosis. 3. Significant distal common carotid artery stenosis bilaterally.    Compared to the previous exam:  Increased velocity bilaterally.      Assessment: Benjamin Patel is a 80 y.o. male who presents with asymptomatic minimal internal carotid artery stenosis bilaterally, higher end or range. Bilateral external carotid artery stenosis. Significant distal common carotid artery stenosis bilaterally. Increased velocity bilaterally. The  ICA stenosis is  Worse from previous exam.  Plan: Based on today's exam and carotid Duplex result, and after discussing with Dr. Kellie Simmering, patient advised to follow-up in 6 months with Carotid Duplex scan.   I discussed in depth with the patient the nature of atherosclerosis, and emphasized the importance of maximal medical management including strict control of blood pressure, blood glucose, and lipid levels, obtaining regular exercise, and continued cessation of smoking.  The patient is aware that without maximal medical management the underlying atherosclerotic disease process will progress, limiting the benefit of any interventions. The patient was given information about stroke prevention and what symptoms should prompt the patient to seek  immediate medical care. Thank you for allowing [place] to participate in this patient's care.  Clemon Chambers, RN, MSN, FNP-C [facility] Office: [facility] Physician: Kellie Simmering  03/23/2013 3:52 PM

## 2013-03-23 NOTE — Patient Instructions (Signed)

## 2013-03-24 ENCOUNTER — Encounter: Payer: Self-pay | Admitting: Thoracic Surgery (Cardiothoracic Vascular Surgery)

## 2013-03-24 ENCOUNTER — Other Ambulatory Visit: Payer: Medicare Other

## 2013-03-24 ENCOUNTER — Ambulatory Visit
Admission: RE | Admit: 2013-03-24 | Discharge: 2013-03-24 | Disposition: A | Discharge status: Home / Self Care | Payer: Medicare Other | Source: Ambulatory Visit | Attending: Thoracic Surgery (Cardiothoracic Vascular Surgery) | Admitting: Thoracic Surgery (Cardiothoracic Vascular Surgery)

## 2013-03-24 ENCOUNTER — Ambulatory Visit (INDEPENDENT_AMBULATORY_CARE_PROVIDER_SITE_OTHER): Payer: Medicare Other | Admitting: Thoracic Surgery (Cardiothoracic Vascular Surgery)

## 2013-03-24 ENCOUNTER — Ambulatory Visit: Payer: Medicare Other | Admitting: Neurosurgery

## 2013-03-24 VITALS — BP 145/70 | HR 60 | Resp 20 | Ht 68.5 in | Wt 167.0 lb

## 2013-03-24 DIAGNOSIS — Z09 Encounter for follow-up examination after completed treatment for conditions other than malignant neoplasm: Secondary | ICD-10-CM | POA: Diagnosis not present

## 2013-03-24 DIAGNOSIS — R911 Solitary pulmonary nodule: Secondary | ICD-10-CM | POA: Diagnosis not present

## 2013-03-24 DIAGNOSIS — J449 Chronic obstructive pulmonary disease, unspecified: Secondary | ICD-10-CM | POA: Diagnosis not present

## 2013-03-24 DIAGNOSIS — C3490 Malignant neoplasm of unspecified part of unspecified bronchus or lung: Secondary | ICD-10-CM

## 2013-03-24 DIAGNOSIS — D381 Neoplasm of uncertain behavior of trachea, bronchus and lung: Secondary | ICD-10-CM

## 2013-03-24 NOTE — Progress Notes (Signed)
HPI:  Mr. Rehman returns today for a scheduled followup visit.  He had a thoracoscopic superior segmentectomy of the left lower lobe for stage IA non-small cell carcinoma back in June of 2014. He was last seen in the office in October of 2014. That time he was doing well. His CT of the chest did show a small (4.8 mm) opacity in the left upper lobe.  Since his last visit he continues to do well. He's not having any problems with chest pain or shortness of breath. He does not have any residual incisional pain. He has an occasional cough. He denies hemoptysis. His weight has been stable.  Past Medical History  Diagnosis Date  . Mixed hyperlipidemia     takes Pravastatin daily  . Gout     no meds required  . Abnormal prostate exam   . Carotid stenosis   . Cholelithiasis   . Renal insufficiency   . Renal artery stenosis     right  . PAC (premature atrial contraction)   . PVCs (premature ventricular contractions)   . Essential hypertension, benign     takes Clonodine,Losartan,and Metoprolol and HCTZ  . Carotid stenosis right side    Dr.Early does carotid ultrasounds yrly-last one Mar 14-report in epic  . Asthma     as a child  . COPD (chronic obstructive pulmonary disease)     slight   . History of bronchitis   . Pneumonia 02/08/2012  . History of colon polyps   . Enlarged prostate     slightly   . Type II or unspecified type diabetes mellitus without mention of complication, not stated as uncontrolled     takes Metformin daily  . History of shingles   . Skin cancer     Lung Ca- Left Lung  . Arthritis     Gout       Current Outpatient Prescriptions  Medication Sig Dispense Refill  . aspirin EC 81 MG tablet Take 81 mg by mouth every evening.      . cloNIDine (CATAPRES) 0.2 MG tablet TAKE 1 TABLET BY MOUTH TWICE A DAY  60 tablet  5  . hydrochlorothiazide (HYDRODIURIL) 25 MG tablet TAKE 1 TABLET BY MOUTH EVERY DAY  30 tablet  5  . metFORMIN (GLUCOPHAGE) 500 MG tablet TAKE  1 TABLET BY MOUTH TWICE A DAY  60 tablet  4  . metoprolol succinate (TOPROL-XL) 100 MG 24 hr tablet TAKE 1 TABLET BY MOUTH EVERY DAY WITH OR IMMEDIATELY FOLLOWING A MEAL  30 tablet  1  . niacin (NIASPAN) 500 MG CR tablet TAKE 1 TABLET AT BEDTIME  30 tablet  5  . pravastatin (PRAVACHOL) 20 MG tablet TAKE 1 TABLET BY MOUTH EVERY DAY  30 tablet  5   No current facility-administered medications for this visit.    Physical Exam BP 145/70  Pulse 60  Resp 20  Ht 5' 8.5" (1.74 m)  Wt 167 lb (75.751 kg)  BMI 25.02 kg/m2  SpO79 64% 80 year old male in no acute distress Alert and oriented x3 with no focal deficits Cardiac regular rate and rhythm normal S1 and S2 Lungs clear with equal breath sounds bilaterally No cervical or suprapubic or adenopathy  Diagnostic Tests: CT chest 03/24/2013 CT CHEST WITHOUT CONTRAST  TECHNIQUE:  Multidetector CT imaging of the chest was performed following the  standard protocol without IV contrast.  COMPARISON: CT CHEST W/O CM dated 10/21/2012  FINDINGS:  Thoracic aorta is atherosclerotic. No aneurysm. Coronary artery  disease.  Cardiomegaly. These findings are stable.  Shotty mediastinal lymph nodes are present. These are nonspecific.  No hilar adenopathy noted. Thoracic esophagus is unremarkable.  Adrenals appear stable. Nonobstructive left nephrolithiasis. Stable  simple cyst left kidney. Gallstones.  Large airways patent. Changes consistent severe bullous COPD.  Pleural parenchymal scarring. Left apical prominent pleural  parenchymal thickening and nodularity is stable and consistent with  scarring appear Changes consistent with scarring left lower lobe. No  evidence of significant pulmonary nodule or recurrent mass.  Thyroid unremarkable. No supraclavicular or axillary mass lesions.  Chest wall is intact. Degenerative changes thoracic spine. Multiple  stable mild thoracic spine compression fractures present.  IMPRESSION:  1. Stable chest with  postoperative changes and left lower lobe. No  evidence of a significant pulmonary nodule or recurrent pulmonary  mass.  2. Severe COPD. Stable pleural parenchymal scarring.  3. Nonobstructive left nephrolithiasis. Gallstones.  Electronically Signed  By: Marcello Moores Register  On: 03/24/2013 12:39  Impression: Mr. Grabe is an 80 year old gentleman who had a thoracoscopic superior segmentectomy for stage IA non-small cell carcinoma 8 months ago. He's doing well at this point in time. He has no evidence recurrent disease. The small nodule in the left upper lobe was not commented on by radiology. It does not appear to have changed significantly in the interim. We will need to continue to follow this over time.  He had carotid ultrasound at [facility] yesterday. He had questions about the results. I reviewed those with them it appears that he has 1-39% bilateral carotid stenoses near the upper end of the scale. He is asymptomatic. He says that they recommended followup in 6 months.  Plan: I'll plan to see him back in 4 months for one year followup visit. We will repeat a CT of the chest at that time.

## 2013-03-24 NOTE — Addendum Note (Signed)
Addended by: Dorthula Rue L on: 03/24/2013 01:11 PM   Modules accepted: Orders

## 2013-04-08 ENCOUNTER — Telehealth: Payer: Self-pay

## 2013-04-08 NOTE — Telephone Encounter (Signed)
Spoke with pt. Having numbness and tingling in his feet. Advised RTC. He will be here in the morning to see Dr. Everlene Farrier

## 2013-04-08 NOTE — Telephone Encounter (Signed)
LMOM for pt to CB.  

## 2013-04-08 NOTE — Telephone Encounter (Signed)
Patient called stated he have pain in his toes and its preventing him from sleeping at night.

## 2013-04-09 ENCOUNTER — Ambulatory Visit: Payer: Medicare Other

## 2013-04-09 ENCOUNTER — Encounter: Payer: Self-pay | Admitting: Emergency Medicine

## 2013-04-09 ENCOUNTER — Ambulatory Visit (INDEPENDENT_AMBULATORY_CARE_PROVIDER_SITE_OTHER): Payer: Medicare Other | Admitting: Emergency Medicine

## 2013-04-09 VITALS — BP 138/62 | HR 78 | Temp 97.3°F | Resp 18 | Ht 69.0 in | Wt 168.0 lb

## 2013-04-09 DIAGNOSIS — R200 Anesthesia of skin: Secondary | ICD-10-CM

## 2013-04-09 DIAGNOSIS — E119 Type 2 diabetes mellitus without complications: Secondary | ICD-10-CM

## 2013-04-09 DIAGNOSIS — R209 Unspecified disturbances of skin sensation: Secondary | ICD-10-CM | POA: Diagnosis not present

## 2013-04-09 LAB — POCT CBC
GRANULOCYTE PERCENT: 63.5 % (ref 37–80)
HCT, POC: 42.4 % — AB (ref 43.5–53.7)
HEMOGLOBIN: 13.4 g/dL — AB (ref 14.1–18.1)
Lymph, poc: 1.6 (ref 0.6–3.4)
MCH, POC: 29.6 pg (ref 27–31.2)
MCHC: 31.6 g/dL — AB (ref 31.8–35.4)
MCV: 93.8 fL (ref 80–97)
MID (CBC): 0.4 (ref 0–0.9)
MPV: 8.4 fL (ref 0–99.8)
PLATELET COUNT, POC: 246 10*3/uL (ref 142–424)
POC Granulocyte: 3.5 (ref 2–6.9)
POC LYMPH PERCENT: 29.1 %L (ref 10–50)
POC MID %: 7.4 %M (ref 0–12)
RBC: 4.52 M/uL — AB (ref 4.69–6.13)
RDW, POC: 13.4 %
WBC: 5.5 10*3/uL (ref 4.6–10.2)

## 2013-04-09 LAB — POCT GLYCOSYLATED HEMOGLOBIN (HGB A1C): Hemoglobin A1C: 6.1

## 2013-04-09 LAB — VITAMIN B12: VITAMIN B 12: 672 pg/mL (ref 211–911)

## 2013-04-09 LAB — URIC ACID: URIC ACID, SERUM: 8 mg/dL — AB (ref 4.0–7.8)

## 2013-04-09 NOTE — Patient Instructions (Signed)
Tarsal Tunnel Syndrome with Rehab Tarsal tunnel syndrome is a condition that involves pressure (compression) on the nerve in the ankle (posterior tibial nerve) and results in pain and loss of feeling on the bottom of the foot. The nerve is usually compressed by other structures within the ankle. SYMPTOMS   Signs of nerve damage: pain, numbness, tingling, and loss of feeling along the bottom of the foot.  Pain that worsens with activity.  Feeling a lack of stability in the ankle. CAUSES  Tarsal tunnel syndrome is caused by structures within the ankle placing pressure on the nerve inside the ankle, which causes sensations in the bottom of the foot. Common sources of pressure include:  Ligament-like tissue (retinaculum) that covers the nerve area in the ankle (tarsal tunnel).  Bony spurs or bumps.  Inflamed tendons (tendonitis). RISK INCREASES WITH:  Stretched ankle ligaments, which create a loose joint.  Flat feet.  Arthritis of the ankle.  Inflammation of tendons in the foot and ankle.  Previous foot or ankle injury. PREVENTION  Warm up and stretch properly before activity.  Maintain physical fitness:  Strength, flexibility, and endurance.  Cardiovascular fitness (increases heart rate).  Wear properly fitted shoes.  Wear arch supports (orthotics), if you have flat feet.  Protect the ankle with taping, braces, or compression bandages. PROGNOSIS  If treated properly, the symptoms of tarsal tunnel syndrome usually go away with non-surgical treatment. Occasionally, surgery is necessary to free the compressed nerve.  RELATED COMPLICATIONS  Permanent nerve damage, including pain, numbness, tingling, or weakness in the ankle. TREATMENT Treatment first involves resting from any activities that aggravate the symptoms, as well as the use of ice and medicine to reduce pain and inflammation. The use of strengthening and stretching exercises may help reduce pain from activity.  Other treatments include wearing arch supports, if you have flat feet, and cross training (training in various physical activities) to reduce stress on the foot and ankle. If symptoms persist, despite non-surgical treatment, then surgery may be recommended. Surgery usually provides full relief from symptoms.  MEDICATION   If pain medicine is necessary, nonsteroidal anti-inflammatory medicines (aspirin and ibuprofen), or other minor pain relievers (acetaminophen), are often recommended.  Do not take pain medication for 7 days before surgery.  Prescription pain relievers may be given if your caregiver thinks they are necessary. Use only as directed and only as much as you need. HEAT AND COLD  Cold treatment (icing) relieves pain and reduces inflammation. Cold treatment should be applied for 10 to 15 minutes every 2 to 3 hours, and immediately after any activity that aggravates your symptoms. Use ice packs or an ice massage.  Heat treatment may be used prior to performing stretching and strengthening activities prescribed by your caregiver, physical therapist, or athletic trainer. Use a heat pack or a warm water soak. SEEK MEDICAL CARE IF:  Treatment does not seem to help, or the condition worsens.  Any medicines produce negative side effects.  Any complications from surgery occur:  Pain, numbness, or coldness in the affected foot.  Discoloration beneath the toenails (blue or gray) of the affected foot.  Signs of infections (fever, pain, inflammation, redness, or persistent bleeding).

## 2013-04-09 NOTE — Progress Notes (Signed)
Subjective:    Patient ID: Benjamin Patel, male    DOB: 06-13-1927, 80 y.o.   MRN: [id number] This chart was scribed for Darlyne Russian, MD by Anastasia Pall, ED Scribe. This patient was seen in room 11 and the patient's care was started at 8:05 AM.  Chief Complaint  Patient presents with  . left foot pain    feels like pin sticking in left foot x2 weeks at night    HPI Benjamin Patel is a 80 y.o. male Pt with h/o DM type II, presents to the [facility] with gradually worsening, constant, left foot tingling/numbness over the front end of his foot around his toes, onset 1 month ago. He states that the tingling sensation first started in his left great toe then gradually moved across to his 2nd, 3rd, and 4th digits. He denies similar symptoms in his right foot. He denies any other symptoms.   He reports having normal check up with pulmonologist recently.    PCP - [name], MD  Patient Active Problem List   Diagnosis Date Noted  . Type II or unspecified type diabetes mellitus without mention of complication, not stated as uncontrolled 02/10/2013  . Lung cancer 02/10/2013  . Carotid arterial disease 02/10/2013  . Coronary artery calcification 12/22/2012  . Atrial fibrillation 06/23/2012  . CKD (chronic kidney disease) stage 2, GFR 60-89 ml/min 06/19/2012  . COPD/ GOLD III 06/17/2012  . DM (diabetes mellitus) type II controlled with renal manifestation 06/17/2012  . Occlusion and stenosis of carotid artery without mention of cerebral infarction 03/20/2012  . Non-small cell carcinoma of lung, stage 1 02/10/2012  . Essential hypertension, benign   . Mixed hyperlipidemia   . Gout   . DM (diabetes mellitus), type 2 with peripheral vascular complications   . Carotid stenosis   . Cholelithiasis   . Renal insufficiency   . Renal artery stenosis    Prior to Admission medications   Medication Sig Start Date End Date Taking? Authorizing Provider  aspirin EC 81 MG tablet Take 81 mg by mouth  every evening.   Yes Historical Provider, MD  cloNIDine (CATAPRES) 0.2 MG tablet TAKE 1 TABLET BY MOUTH TWICE A DAY 11/08/12  Yes Darlyne Russian, MD  hydrochlorothiazide (HYDRODIURIL) 25 MG tablet TAKE 1 TABLET BY MOUTH EVERY DAY 02/18/13  Yes Darlyne Russian, MD  metFORMIN (GLUCOPHAGE) 500 MG tablet TAKE 1 TABLET BY MOUTH TWICE A DAY 03/19/13  Yes Darlyne Russian, MD  metoprolol succinate (TOPROL-XL) 100 MG 24 hr tablet TAKE 1 TABLET BY MOUTH EVERY DAY WITH OR IMMEDIATELY FOLLOWING A MEAL 10/11/12  Yes Mancel Bale, PA-C  niacin (NIASPAN) 500 MG CR tablet TAKE 1 TABLET AT BEDTIME 11/08/12  Yes Darlyne Russian, MD  pravastatin (PRAVACHOL) 20 MG tablet TAKE 1 TABLET BY MOUTH EVERY DAY 10/11/12  Yes Mancel Bale, PA-C   Review of Systems  Skin: Negative for wound.  Neurological: Positive for numbness (left foot/toes). Negative for weakness.   BP 138/62  Pulse 78  Temp(Src) 97.3 F (36.3 C) (Oral)  Resp 18  Ht 5\' 9"  (1.753 m)  Wt 168 lb (76.204 kg)  BMI 24.80 kg/m2  SpO2 96%     Objective:   Physical Exam Nursing note and vitals reviewed. Constitutional: Pt is oriented to person, place, and time. Pt appears well-developed and well-nourished. No distress.  HENT: Head: Normocephalic and atraumatic.  Eyes: EOM are normal.  Neck: Neck supple.  Cardiovascular: Normal  rate. Pulmonary/Chest: Effort normal. No respiratory distress.  Abdominal:  Musculoskeletal: Normal range of motion. No tenderness. No edema. DP intact.  Neurological: Pt is alert and oriented to person, place, and time.  Skin: Skin is warm and dry.  Psychiatric: Pt has a normal mood and affect. Pt's behavior is normal.  [facility] reading (PRIMARY) by  Dr.Daub there are 3 radiolucent areas over the distal first metatarsal. Please comment do these represent gouty tophi     Assessment & Plan:  Symptoms really do not fit for peripheral neuropathy. He does have mild decreased vibratory sensation to the toes but most of his symptoms seem to  be in 1 foot only. I suspect his symptoms are more likely related to tarsal tunnel syndrome Treat  with arch support and make referral to orthopedist if he continues to have symptoms.

## 2013-04-16 ENCOUNTER — Telehealth: Payer: Self-pay

## 2013-04-16 MED ORDER — COLCHICINE 0.6 MG PO TABS: 0.6000 mg | ORAL_TABLET | Freq: Two times a day (BID) | ORAL | Status: DC

## 2013-04-16 NOTE — Telephone Encounter (Signed)
I think we should try him on colchicine 0.6 to take one twice a day for 7 days. He should hold the naproxen while he takes the colchicine.  call in #14 tablets no refill

## 2013-04-16 NOTE — Telephone Encounter (Signed)
Pt notified and rx sent to pharm

## 2013-04-16 NOTE — Telephone Encounter (Signed)
Pt called his foot is better during the day but really gives him a fit at night. Pt has been taking Naproxen bid. Has worn a brace that was suggested by the pharmacist. This has helped with the pain at night. When pain does come its in his big toe. Do you want him to continue with Naproxen (OTC) or should you start on a gout medication?

## 2013-04-17 ENCOUNTER — Other Ambulatory Visit: Payer: Self-pay | Admitting: Physician Assistant

## 2013-04-17 NOTE — Telephone Encounter (Signed)
Dr Everlene Farrier, you just saw pt but not for lipids. Pt had lipid panel done in 12/2012 at cardiologist, results in [place]. Do you want to give RFs?

## 2013-05-17 ENCOUNTER — Other Ambulatory Visit: Payer: Self-pay | Admitting: Emergency Medicine

## 2013-06-16 ENCOUNTER — Ambulatory Visit (INDEPENDENT_AMBULATORY_CARE_PROVIDER_SITE_OTHER): Payer: Medicare Other | Admitting: Emergency Medicine

## 2013-06-16 ENCOUNTER — Encounter: Payer: Self-pay | Admitting: Emergency Medicine

## 2013-06-16 VITALS — BP 100/74 | HR 56 | Temp 97.5°F | Resp 16 | Ht 67.75 in | Wt 167.4 lb

## 2013-06-16 DIAGNOSIS — E119 Type 2 diabetes mellitus without complications: Secondary | ICD-10-CM

## 2013-06-16 DIAGNOSIS — Z23 Encounter for immunization: Secondary | ICD-10-CM | POA: Diagnosis not present

## 2013-06-16 DIAGNOSIS — E785 Hyperlipidemia, unspecified: Secondary | ICD-10-CM

## 2013-06-16 LAB — GLUCOSE, POCT (MANUAL RESULT ENTRY): POC Glucose: 118 mg/dl — AB (ref 70–99)

## 2013-06-16 LAB — LIPID PANEL
CHOLESTEROL: 159 mg/dL (ref 0–200)
HDL: 35 mg/dL — ABNORMAL LOW (ref >39)
LDL Cholesterol: 70 mg/dL (ref 0–99)
Total CHOL/HDL Ratio: 4.5 Ratio
Triglycerides: 268 mg/dL — ABNORMAL HIGH (ref <150)
VLDL: 54 mg/dL — ABNORMAL HIGH (ref 0–40)

## 2013-06-16 LAB — BASIC METABOLIC PANEL
BUN: 23 mg/dL (ref 6–23)
CO2: 28 meq/L (ref 19–32)
Calcium: 10 mg/dL (ref 8.4–10.5)
Chloride: 101 mEq/L (ref 96–112)
Creat: 1.13 mg/dL (ref 0.50–1.35)
GLUCOSE: 112 mg/dL — AB (ref 70–99)
POTASSIUM: 5 meq/L (ref 3.5–5.3)
Sodium: 139 mEq/L (ref 135–145)

## 2013-06-16 LAB — MICROALBUMIN, URINE: Microalb, Ur: 1.28 mg/dL (ref 0.00–1.89)

## 2013-06-16 LAB — POCT GLYCOSYLATED HEMOGLOBIN (HGB A1C): Hemoglobin A1C: 6

## 2013-06-16 NOTE — Progress Notes (Signed)
   Subjective:    Patient ID: Benjamin Patel, male    DOB: [birth year], 80 y.o.   MRN: [id number]  HPI patient in for followup hypertension, hyperlipidemia, and diabetes mellitus. His history lung cancer and has regular followups for this. Has a history of carotid stenosis and also to receiving intermittent carotid studies for this. He is status post resection of his colon cancer. He has a history of coronary calcification but recent stress test was good. He denies chest pain. His foot discomfort and neuropathy is improved. Otherwise he is doing well   Review of Systems     Objective:   Physical Exam patient looks great his neck is supple there are bilateral carotid bruits. Cardiac exam reveals a regular rate. Extremity exam reveals his neuropathy but good pulses and sensation .  Results for orders placed in visit on 06/16/13  GLUCOSE, POCT (MANUAL RESULT ENTRY)      Result Value Ref Range   POC Glucose 118 (*) 70 - 99 mg/dl  POCT GLYCOSYLATED HEMOGLOBIN (HGB A1C)      Result Value Ref Range   Hemoglobin A1C 6.0          Assessment & Plan:  He was given Prevnar vaccination today. Routine labs were done. Recheck 3-4 months.

## 2013-06-17 ENCOUNTER — Encounter: Payer: Self-pay | Admitting: *Deleted

## 2013-06-26 ENCOUNTER — Other Ambulatory Visit: Payer: Self-pay | Admitting: *Deleted

## 2013-06-26 DIAGNOSIS — C3490 Malignant neoplasm of unspecified part of unspecified bronchus or lung: Secondary | ICD-10-CM

## 2013-07-18 ENCOUNTER — Ambulatory Visit (INDEPENDENT_AMBULATORY_CARE_PROVIDER_SITE_OTHER): Payer: Medicare Other | Admitting: Emergency Medicine

## 2013-07-18 VITALS — BP 138/52 | HR 105 | Temp 98.2°F | Resp 20 | Ht 67.5 in | Wt 165.0 lb

## 2013-07-18 DIAGNOSIS — R300 Dysuria: Secondary | ICD-10-CM

## 2013-07-18 DIAGNOSIS — R350 Frequency of micturition: Secondary | ICD-10-CM | POA: Diagnosis not present

## 2013-07-18 DIAGNOSIS — E119 Type 2 diabetes mellitus without complications: Secondary | ICD-10-CM | POA: Diagnosis not present

## 2013-07-18 LAB — CBC WITH DIFFERENTIAL/PLATELET
Basophils Absolute: 0 10*3/uL (ref 0.0–0.1)
Basophils Relative: 0 % (ref 0–1)
EOS PCT: 0 % (ref 0–5)
Eosinophils Absolute: 0 10*3/uL (ref 0.0–0.7)
HEMATOCRIT: 36.6 % — AB (ref 39.0–52.0)
Hemoglobin: 12.9 g/dL — ABNORMAL LOW (ref 13.0–17.0)
LYMPHS ABS: 0.9 10*3/uL (ref 0.7–4.0)
Lymphocytes Relative: 6 % — ABNORMAL LOW (ref 12–46)
MCH: 30.5 pg (ref 26.0–34.0)
MCHC: 35.2 g/dL (ref 30.0–36.0)
MCV: 86.5 fL (ref 78.0–100.0)
MONO ABS: 1 10*3/uL (ref 0.1–1.0)
Monocytes Relative: 7 % (ref 3–12)
Neutro Abs: 12.8 10*3/uL — ABNORMAL HIGH (ref 1.7–7.7)
Neutrophils Relative %: 87 % — ABNORMAL HIGH (ref 43–77)
Platelets: 190 10*3/uL (ref 150–400)
RBC: 4.23 MIL/uL (ref 4.22–5.81)
RDW: 12.7 % (ref 11.5–15.5)
WBC: 14.7 10*3/uL — ABNORMAL HIGH (ref 4.0–10.5)

## 2013-07-18 LAB — POCT UA - MICROSCOPIC ONLY
CRYSTALS, UR, HPF, POC: NEGATIVE
Casts, Ur, LPF, POC: NEGATIVE
Yeast, UA: NEGATIVE

## 2013-07-18 LAB — POCT URINALYSIS DIPSTICK
Bilirubin, UA: NEGATIVE
Glucose, UA: NEGATIVE
KETONES UA: NEGATIVE
Nitrite, UA: NEGATIVE
PROTEIN UA: 30
SPEC GRAV UA: 1.01
Urobilinogen, UA: 0.2
pH, UA: 5

## 2013-07-18 LAB — GLUCOSE, POCT (MANUAL RESULT ENTRY): POC Glucose: 260 mg/dl — AB (ref 70–99)

## 2013-07-18 MED ORDER — CEPHALEXIN 500 MG PO CAPS: 500.0000 mg | ORAL_CAPSULE | Freq: Three times a day (TID) | ORAL | Status: DC

## 2013-07-18 MED ORDER — CEFTRIAXONE SODIUM 1 G IJ SOLR
1.0000 g | Freq: Once | INTRAMUSCULAR | Status: AC
  Administered 2013-07-18: 1 g via INTRAMUSCULAR

## 2013-07-18 NOTE — Patient Instructions (Signed)
Recheck on Monday 07/20/13

## 2013-07-18 NOTE — Progress Notes (Addendum)
Subjective:  This chart was scribed for Remo Lipps A. Raseel Jans MD,   by Stacy Gardner, [facility] Scribe. The patient was seen in room 1 and the patient's care was started at 8:21 AM.   Patient ID: Benjamin Patel, male    DOB: 03-31-1927, 80 y.o.   MRN: [id number] Chief Complaint  Patient presents with  . Urinary Frequency    3 days  . Dysuria    Urinary Frequency  Associated symptoms include frequency and urgency. Pertinent negatives include no nausea or vomiting.  Dysuria  Associated symptoms include frequency and urgency. Pertinent negatives include no nausea or vomiting.   HPI Comments: Benjamin Patel is a 79 y.o. male who arrives to the [facility] complaining of urinary frequency and dysuria for the past three days with sudden onset. He had a low grade fever since yesterday that resolved yesterday and urgency. Denies difficulty urinating. Denies vomiting. His last UTI was over a year ago and only lasted a few days.   Patient Active Problem List   Diagnosis Date Noted  . Type II or unspecified type diabetes mellitus without mention of complication, not stated as uncontrolled 02/10/2013  . Lung cancer 02/10/2013  . Carotid arterial disease 02/10/2013  . Coronary artery calcification 12/22/2012  . Atrial fibrillation 06/23/2012  . CKD (chronic kidney disease) stage 2, GFR 60-89 ml/min 06/19/2012  . COPD/ GOLD III 06/17/2012  . DM (diabetes mellitus) type II controlled with renal manifestation 06/17/2012  . Occlusion and stenosis of carotid artery without mention of cerebral infarction 03/20/2012  . Non-small cell carcinoma of lung, stage 1 02/10/2012  . Essential hypertension, benign   . Mixed hyperlipidemia   . Gout   . DM (diabetes mellitus), type 2 with peripheral vascular complications   . Carotid stenosis   . Cholelithiasis   . Renal insufficiency   . Renal artery stenosis    Past Medical History  Diagnosis Date  . Mixed  hyperlipidemia     takes Pravastatin daily  . Gout     no meds required  . Abnormal prostate exam   . Carotid stenosis   . Cholelithiasis   . Renal insufficiency   . Renal artery stenosis     right  . PAC (premature atrial contraction)   . PVCs (premature ventricular contractions)   . Essential hypertension, benign     takes Clonodine,Losartan,and Metoprolol and HCTZ  . Carotid stenosis right side    Dr.Early does carotid ultrasounds yrly-last one Mar 14-report in epic  . Asthma     as a child  . COPD (chronic obstructive pulmonary disease)     slight   . History of bronchitis   . Pneumonia 02/08/2012  . History of colon polyps   . Enlarged prostate     slightly   . Type II or unspecified type diabetes mellitus without mention of complication, not stated as uncontrolled     takes Metformin daily  . History of shingles   . Skin cancer     Lung Ca- Left Lung  . Arthritis     Gout    Past Surgical History  Procedure Laterality Date  . No past surgeries    . Colonoscopy    . Video assisted thoracoscopy Left 06/16/2012    Procedure: VIDEO ASSISTED THORACOSCOPY;  Surgeon: [name], MD;  Location: [facility];  Service: Thoracic;  Laterality: Left;  . Segmentecomy Left 06/16/2012    Procedure: left lower  lobe superior SEGMENTECTOMY with node dissection;  Surgeon: [name], MD;  Location: [facility];  Service: Thoracic;  Laterality: Left;  left superior   Allergies  Allergen Reactions  . Ace Inhibitors Cough  . Codeine Other (See Comments)    GI Upset  . Indomethacin Other (See Comments)    Elevated BP  . Losartan Swelling    ANGIOEDEMA/    Prior to Admission medications   Medication Sig Start Date End Date Taking? Authorizing Provider  aspirin EC 81 MG tablet Take 81 mg by mouth every evening.   Yes Historical Provider, MD  cloNIDine (CATAPRES) 0.2 MG tablet TAKE 1 TABLET BY MOUTH TWICE A DAY   Yes Darlyne Russian, MD  hydrochlorothiazide (HYDRODIURIL) 25 MG  tablet TAKE 1 TABLET BY MOUTH EVERY DAY 02/18/13  Yes Darlyne Russian, MD  metFORMIN (GLUCOPHAGE) 500 MG tablet TAKE 1 TABLET BY MOUTH TWICE A DAY 03/19/13  Yes Darlyne Russian, MD  metoprolol succinate (TOPROL-XL) 100 MG 24 hr tablet TAKE 1 TABLET BY MOUTH EVERY DAY WITH OR IMMEDIATELY FOLLOWING A MEAL 10/11/12  Yes Mancel Bale, PA-C  niacin (NIASPAN) 500 MG CR tablet TAKE 1 TABLET AT BEDTIME   Yes Darlyne Russian, MD  pravastatin (PRAVACHOL) 20 MG tablet TAKE 1 TABLET BY MOUTH EVERY DAY   Yes Darlyne Russian, MD  colchicine 0.6 MG tablet Take 1 tablet (0.6 mg total) by mouth 2 (two) times daily. 04/16/13   Darlyne Russian, MD   History   Social History  . Marital Status: Married    Spouse Name: N/A    Number of Children: 3  . Years of Education: N/A   Occupational History  . retired     Herbalist   Social History Main Topics  . Smoking status: Former Smoker -- 1.50 packs/day for 30 years    Types: Cigarettes    Quit date: 01/16/1976  . Smokeless tobacco: Never Used  . Alcohol Use: No  . Drug Use: No  . Sexual Activity: Not on file   Other Topics Concern  . Not on file   Social History Narrative  . No narrative on file       Review of Systems  Constitutional: Positive for fever.  Gastrointestinal: Negative for nausea and vomiting.  Genitourinary: Positive for dysuria, urgency and frequency. Negative for difficulty urinating.       Objective:   Physical Exam CONSTITUTIONAL: Well developed/well nourished HEAD: Normocephalic/atraumatic EYES: EOMI/PERRL ENMT: Mucous membranes moist NECK: supple no meningeal signs SPINE:entire spine nontender CV: S1/S2 noted, no murmurs/rubs/gallops noted LUNGS: Lungs are clear to auscultation bilaterally, no apparent distress ABDOMEN: soft, minimal suprapubic tender, no rebound or guarding GU:no cva tenderness NEURO: Pt is awake/alert, moves all extremitiesx4 EXTREMITIES: pulses normal, full ROM SKIN: warm, color normal PSYCH: no  abnormalities of mood noted   Filed Vitals:   07/18/13 0816  BP: 138/52  Pulse: 105  Temp: 98.2 F (36.8 C)  Resp: 20  Height: 5' 7.5" (1.715 m)  Weight: 165 lb (74.844 kg)  SpO2: 93%   DIAGNOSTIC STUDIES: Oxygen Saturation is 93% on room air, low by my interpretation.    COORDINATION OF CARE:  8:25 AM Discussed course of care with pt . Pt understands and agrees.   Results for orders placed in visit on 07/18/13  GLUCOSE, POCT (MANUAL RESULT ENTRY)      Result Value Ref Range   POC Glucose 260 (*) 70 - 99 mg/dl  POCT  UA - MICROSCOPIC ONLY      Result Value Ref Range   WBC, Ur, HPF, POC 30-40     RBC, urine, microscopic 10-20     Bacteria, U Microscopic small     Mucus, UA small     Epithelial cells, urine per micros 2-4     Crystals, Ur, HPF, POC neg     Casts, Ur, LPF, POC neg     Yeast, UA neg    POCT URINALYSIS DIPSTICK      Result Value Ref Range   Color, UA yellow     Clarity, UA cloudy     Glucose, UA neg     Bilirubin, UA neg     Ketones, UA neg     Spec Grav, UA 1.010     Blood, UA moderate     pH, UA 5.0     Protein, UA 30     Urobilinogen, UA 0.2     Nitrite, UA neg     Leukocytes, UA moderate (2+)             Assessment & Plan:   Patient appears to have a urinary tract infection. His last urine culture with infection was in 2012  grew a staph species. He will be treated with IM Rocephin and cephalexin oral. Urine culture was done diabetes not under control probably secondary to the infection

## 2013-07-18 NOTE — Addendum Note (Signed)
Addended by: Orion Crook on: 07/18/2013 12:40 PM   Modules accepted: Orders

## 2013-07-20 ENCOUNTER — Ambulatory Visit (INDEPENDENT_AMBULATORY_CARE_PROVIDER_SITE_OTHER): Payer: Medicare Other | Admitting: Emergency Medicine

## 2013-07-20 VITALS — BP 118/70 | HR 102 | Temp 97.4°F | Resp 14 | Ht 67.75 in | Wt 163.0 lb

## 2013-07-20 DIAGNOSIS — I6529 Occlusion and stenosis of unspecified carotid artery: Secondary | ICD-10-CM

## 2013-07-20 DIAGNOSIS — R300 Dysuria: Secondary | ICD-10-CM | POA: Diagnosis not present

## 2013-07-20 DIAGNOSIS — N390 Urinary tract infection, site not specified: Secondary | ICD-10-CM

## 2013-07-20 LAB — POCT UA - MICROSCOPIC ONLY
Bacteria, U Microscopic: NEGATIVE
CASTS, UR, LPF, POC: NEGATIVE
Crystals, Ur, HPF, POC: NEGATIVE
MUCUS UA: NEGATIVE
YEAST UA: NEGATIVE

## 2013-07-20 LAB — POCT URINALYSIS DIPSTICK
Bilirubin, UA: NEGATIVE
Glucose, UA: NEGATIVE
Ketones, UA: NEGATIVE
NITRITE UA: NEGATIVE
PH UA: 6
PROTEIN UA: 100
Spec Grav, UA: 1.015
Urobilinogen, UA: 0.2

## 2013-07-20 MED ORDER — AMOXICILLIN 875 MG PO TABS: 875.0000 mg | ORAL_TABLET | Freq: Two times a day (BID) | ORAL | Status: DC

## 2013-07-20 NOTE — Patient Instructions (Signed)
Please stop the cephalexin. I have called in a prescription for amoxicillin   .Urinary Tract Infection Urinary tract infections (UTIs) can develop anywhere along your urinary tract. Your urinary tract is your body's drainage system for removing wastes and extra water. Your urinary tract includes two kidneys, two ureters, a bladder, and a urethra. Your kidneys are a pair of bean-shaped organs. Each kidney is about the size of your fist. They are located below your ribs, one on each side of your spine. CAUSES Infections are caused by microbes, which are microscopic organisms, including fungi, viruses, and bacteria. These organisms are so small that they can only be seen through a microscope. Bacteria are the microbes that most commonly cause UTIs. SYMPTOMS  Symptoms of UTIs may vary by age and gender of the patient and by the location of the infection. Symptoms in young women typically include a frequent and intense urge to urinate and a painful, burning feeling in the bladder or urethra during urination. Older women and men are more likely to be tired, shaky, and weak and have muscle aches and abdominal pain. A fever may mean the infection is in your kidneys. Other symptoms of a kidney infection include pain in your back or sides below the ribs, nausea, and vomiting. DIAGNOSIS To diagnose a UTI, your caregiver will ask you about your symptoms. Your caregiver also will ask to provide a urine sample. The urine sample will be tested for bacteria and white blood cells. White blood cells are made by your body to help fight infection. TREATMENT  Typically, UTIs can be treated with medication. Because most UTIs are caused by a bacterial infection, they usually can be treated with the use of antibiotics. The choice of antibiotic and length of treatment depend on your symptoms and the type of bacteria causing your infection. HOME CARE INSTRUCTIONS  If you were prescribed antibiotics, take them exactly as your  caregiver instructs you. Finish the medication even if you feel better after you have only taken some of the medication.  Drink enough water and fluids to keep your urine clear or pale yellow.  Avoid caffeine, tea, and carbonated beverages. They tend to irritate your bladder.  Empty your bladder often. Avoid holding urine for long periods of time.  Empty your bladder before and after sexual intercourse.  After a bowel movement, women should cleanse from front to back. Use each tissue only once. SEEK MEDICAL CARE IF:   You have back pain.  You develop a fever.  Your symptoms do not begin to resolve within 3 days. SEEK IMMEDIATE MEDICAL CARE IF:   You have severe back pain or lower abdominal pain.  You develop chills.  You have nausea or vomiting.  You have continued burning or discomfort with urination. MAKE SURE YOU:   Understand these instructions.  Will watch your condition.  Will get help right away if you are not doing well or get worse. Document Released: 10/11/2004 Document Revised: 07/03/2011 Document Reviewed: 02/09/2011 [facility] Patient Information 2015 [place], [place]. This information is not intended to replace advice given to you by your health care provider. Make sure you discuss any questions you have with your health care provider.

## 2013-07-20 NOTE — Progress Notes (Signed)
   Subjective:    Patient ID: Benjamin Patel, male    DOB: January 25, 1927, 80 y.o.   MRN: [id number]  HPI Pt here for recheck of UTI.  He is doing somewhat better. His urine cx is still pending. He is still having burning with urination. He is not having to strain to pee. He is drinking plenty. No fever.   He also presents with a rash on left arm. He says he does believe it started before he started the medicine. In the yard a lot. He feels it may be related to this. It does itch. He does not have any rash on his trunk.  Review of Systems     Objective:   Physical Exam Patient is alert and cooperative not ill-appearing chest is clear there is no tenderness at in the flank area abdomen is soft there are no masses palpable. There are no areas of tenderness.  Results for orders placed in visit on 07/20/13  POCT URINALYSIS DIPSTICK      Result Value Ref Range   Color, UA yellow     Clarity, UA clear     Glucose, UA neg     Bilirubin, UA neg     Ketones, UA neg     Spec Grav, UA 1.015     Blood, UA large     pH, UA 6.0     Protein, UA 100     Urobilinogen, UA 0.2     Nitrite, UA neg     Leukocytes, UA Trace    POCT UA - MICROSCOPIC ONLY      Result Value Ref Range   WBC, Ur, HPF, POC 3-6     RBC, urine, microscopic tntc     Bacteria, U Microscopic neg     Mucus, UA neg     Epithelial cells, urine per micros 0-3     Crystals, Ur, HPF, POC neg     Casts, Ur, LPF, POC neg     Yeast, UA neg         Assessment & Plan:  Cultures: Growing enterococcus species sensitivity pending. We'll stop cephalexin and change to amoxicillin 500  3  times a day I suspect the rash on your arms secondary to poison ivy and you can use cortisone cream to these areas .Marland Kitchen

## 2013-07-21 LAB — URINE CULTURE: Colony Count: >=100000

## 2013-07-24 ENCOUNTER — Ambulatory Visit (INDEPENDENT_AMBULATORY_CARE_PROVIDER_SITE_OTHER): Payer: Medicare Other | Admitting: Emergency Medicine

## 2013-07-24 VITALS — BP 154/64 | HR 92 | Temp 98.1°F | Resp 18 | Ht 68.5 in | Wt 163.8 lb

## 2013-07-24 DIAGNOSIS — R319 Hematuria, unspecified: Secondary | ICD-10-CM

## 2013-07-24 DIAGNOSIS — E119 Type 2 diabetes mellitus without complications: Secondary | ICD-10-CM | POA: Diagnosis not present

## 2013-07-24 DIAGNOSIS — I6529 Occlusion and stenosis of unspecified carotid artery: Secondary | ICD-10-CM

## 2013-07-24 DIAGNOSIS — N390 Urinary tract infection, site not specified: Secondary | ICD-10-CM

## 2013-07-24 DIAGNOSIS — M25569 Pain in unspecified knee: Secondary | ICD-10-CM | POA: Diagnosis not present

## 2013-07-24 DIAGNOSIS — M25562 Pain in left knee: Secondary | ICD-10-CM

## 2013-07-24 LAB — POCT URINALYSIS DIPSTICK
Bilirubin, UA: NEGATIVE
Glucose, UA: NEGATIVE
Nitrite, UA: NEGATIVE
Protein, UA: NEGATIVE
Spec Grav, UA: 1.015
UROBILINOGEN UA: 0.2
pH, UA: 5

## 2013-07-24 LAB — CBC WITH DIFFERENTIAL/PLATELET
BASOS ABS: 0 10*3/uL (ref 0.0–0.1)
Basophils Relative: 0 % (ref 0–1)
Eosinophils Absolute: 0.3 10*3/uL (ref 0.0–0.7)
Eosinophils Relative: 3 % (ref 0–5)
HCT: 37.6 % — ABNORMAL LOW (ref 39.0–52.0)
Hemoglobin: 13.3 g/dL (ref 13.0–17.0)
LYMPHS ABS: 2.4 10*3/uL (ref 0.7–4.0)
LYMPHS PCT: 23 % (ref 12–46)
MCH: 30.1 pg (ref 26.0–34.0)
MCHC: 35.4 g/dL (ref 30.0–36.0)
MCV: 85.1 fL (ref 78.0–100.0)
Monocytes Absolute: 1 10*3/uL (ref 0.1–1.0)
Monocytes Relative: 10 % (ref 3–12)
NEUTROS ABS: 6.7 10*3/uL (ref 1.7–7.7)
Neutrophils Relative %: 64 % (ref 43–77)
PLATELETS: 268 10*3/uL (ref 150–400)
RBC: 4.42 MIL/uL (ref 4.22–5.81)
RDW: 12.4 % (ref 11.5–15.5)
WBC: 10.4 10*3/uL (ref 4.0–10.5)

## 2013-07-24 LAB — POCT UA - MICROSCOPIC ONLY
BACTERIA, U MICROSCOPIC: NEGATIVE
CASTS, UR, LPF, POC: NEGATIVE
Crystals, Ur, HPF, POC: NEGATIVE
Mucus, UA: NEGATIVE
Yeast, UA: NEGATIVE

## 2013-07-24 LAB — GLUCOSE, POCT (MANUAL RESULT ENTRY): POC GLUCOSE: 125 mg/dL — AB (ref 70–99)

## 2013-07-24 LAB — URIC ACID: Uric Acid, Serum: 10.2 mg/dL — ABNORMAL HIGH (ref 4.0–7.8)

## 2013-07-24 MED ORDER — COLCHICINE 0.6 MG PO TABS: ORAL_TABLET | ORAL | Status: DC

## 2013-07-24 NOTE — Progress Notes (Signed)
   Subjective:    Patient ID: Benjamin Patel, male    DOB: Jun 07, 1927, 80 y.o.   MRN: [id number] This chart was scribed for Arlyss Queen, MD by Vernell Barrier, Medical Scribe. The patient was seen in room 4. This patient's care was started at 5:08 PM.  HPI HPI Comments: Benjamin Patel is a 80 y.o. male w/ hx of gout presents to the [facility] complaining of left knee pain w/ swelling; onset 3 days ago. Pain began at the top of the knee and has gradually gotten worse. Pain and inability to perform left knee flexion. Has a cane to assist with ambuluation; does not use. Has ability to retrieve walker. Seen 7/4 & 7/6 for 3 days of UTI related symptoms including dysuria and urinary frequency. Treated with IM Rocephin and Cephalexin oral. 2 days later, stopped Cephalexin and started on Amoxicillin. Compliant with medication. Urinary sxs have since resolved.   Review of Systems  Musculoskeletal: Positive for arthralgias and joint swelling.   Objective:  Physical Exam CONSTITUTIONAL: Well developed/well nourished HEAD: Normocephalic/atraumatic EYES: EOMI/PERRL ENMT: Mucous membranes moist NECK: supple no meningeal signs SPINE:entire spine nontender CV: S1/S2 noted, no murmurs/rubs/gallops noted LUNGS: Lungs are clear to auscultation bilaterally, no apparent distress ABDOMEN: soft, nontender, no rebound or guarding GU:no cva tenderness NEURO: Pt is awake/alert, moves all extremitiesx4 EXTREMITIES: pulses normal, full ROM; Left knee there is redness over the patella. He is able to flex extend left knee and there does not appear to be fluid on the knee joint. No joint instability. He does walk with a limp SKIN: warm, color normal PSYCH: no abnormalities of mood noted Results for orders placed in visit on 07/24/13  POCT UA - MICROSCOPIC ONLY      Result Value Ref Range   WBC, Ur, HPF, POC 0-3     RBC, urine, microscopic 0-2     Bacteria, U Microscopic neg     Mucus, UA neg      Epithelial cells, urine per micros 0-2     Crystals, Ur, HPF, POC neg     Casts, Ur, LPF, POC neg     Yeast, UA neg    POCT URINALYSIS DIPSTICK      Result Value Ref Range   Color, UA yellow     Clarity, UA clear     Glucose, UA neg     Bilirubin, UA neg     Ketones, UA trace     Spec Grav, UA 1.015     Blood, UA trace-lysed     pH, UA 5.0     Protein, UA neg     Urobilinogen, UA 0.2     Nitrite, UA neg     Leukocytes, UA Trace    GLUCOSE, POCT (MANUAL RESULT ENTRY)      Result Value Ref Range   POC Glucose 125 (*) 70 - 99 mg/dl   Assessment & Plan:  Urinary tract infection is clear. I do think this is a flare of gout. I wrote him a prescription for colchicine to have a lot with cherry juice and drinking large quantities of fluids.  I personally performed the services described in this documentation, which was scribed in my presence. The recorded information has been reviewed and is accurate.

## 2013-08-04 ENCOUNTER — Ambulatory Visit (INDEPENDENT_AMBULATORY_CARE_PROVIDER_SITE_OTHER): Payer: Medicare Other | Admitting: Thoracic Surgery (Cardiothoracic Vascular Surgery)

## 2013-08-04 ENCOUNTER — Ambulatory Visit
Admission: RE | Admit: 2013-08-04 | Discharge: 2013-08-04 | Disposition: A | Discharge status: Home / Self Care | Payer: Medicare Other | Source: Ambulatory Visit | Attending: Thoracic Surgery (Cardiothoracic Vascular Surgery) | Admitting: Thoracic Surgery (Cardiothoracic Vascular Surgery)

## 2013-08-04 ENCOUNTER — Encounter: Payer: Self-pay | Admitting: Thoracic Surgery (Cardiothoracic Vascular Surgery)

## 2013-08-04 VITALS — BP 136/75 | HR 99 | Resp 16 | Ht 67.5 in | Wt 165.0 lb

## 2013-08-04 DIAGNOSIS — C3430 Malignant neoplasm of lower lobe, unspecified bronchus or lung: Secondary | ICD-10-CM

## 2013-08-04 DIAGNOSIS — C3490 Malignant neoplasm of unspecified part of unspecified bronchus or lung: Secondary | ICD-10-CM

## 2013-08-04 DIAGNOSIS — Z85118 Personal history of other malignant neoplasm of bronchus and lung: Secondary | ICD-10-CM | POA: Diagnosis not present

## 2013-08-04 DIAGNOSIS — Z09 Encounter for follow-up examination after completed treatment for conditions other than malignant neoplasm: Secondary | ICD-10-CM

## 2013-08-04 DIAGNOSIS — J438 Other emphysema: Secondary | ICD-10-CM | POA: Diagnosis not present

## 2013-08-04 NOTE — Progress Notes (Signed)
HPI:  Benjamin Patel returns for a scheduled 1 year follow up visit.  He is an 80 year old man who had a left lower lobe superior segmentectomy for stage IA non-small cell carcinoma in June of 2014. He was last in the office back in March of this year at which time he had no evidence of recurrent disease.  He says that since that time he has continued to do well. His weight is stable. He has an occasional cough, but nothing out of the ordinary. He has not had any hemoptysis. His breathing is at baseline. He has not had any unusual headaches or visual changes. He does not have any new or unusual bone or joint pain.  Past Medical History  Diagnosis Date  . Mixed hyperlipidemia     takes Pravastatin daily  . Gout     no meds required  . Abnormal prostate exam   . Carotid stenosis   . Cholelithiasis   . Renal insufficiency   . Renal artery stenosis     right  . PAC (premature atrial contraction)   . PVCs (premature ventricular contractions)   . Essential hypertension, benign     takes Clonodine,Losartan,and Metoprolol and HCTZ  . Carotid stenosis right side    Dr.Early does carotid ultrasounds yrly-last one Mar 14-report in epic  . Asthma     as a child  . COPD (chronic obstructive pulmonary disease)     slight   . History of bronchitis   . Pneumonia 02/08/2012  . History of colon polyps   . Enlarged prostate     slightly   . Type II or unspecified type diabetes mellitus without mention of complication, not stated as uncontrolled     takes Metformin daily  . History of shingles   . Skin cancer     Lung Ca- Left Lung  . Arthritis     Gout       Current Outpatient Prescriptions  Medication Sig Dispense Refill  . aspirin EC 81 MG tablet Take 81 mg by mouth every evening.      . cloNIDine (CATAPRES) 0.2 MG tablet TAKE 1 TABLET BY MOUTH TWICE A DAY  60 tablet  2  . hydrochlorothiazide (HYDRODIURIL) 25 MG tablet TAKE 1 TABLET BY MOUTH EVERY DAY  30 tablet  5  . metFORMIN  (GLUCOPHAGE) 500 MG tablet TAKE 1 TABLET BY MOUTH TWICE A DAY  60 tablet  4  . metoprolol succinate (TOPROL-XL) 100 MG 24 hr tablet TAKE 1 TABLET BY MOUTH EVERY DAY WITH OR IMMEDIATELY FOLLOWING A MEAL  30 tablet  1  . niacin (NIASPAN) 500 MG CR tablet TAKE 1 TABLET AT BEDTIME  30 tablet  2  . pravastatin (PRAVACHOL) 20 MG tablet TAKE 1 TABLET BY MOUTH EVERY DAY  30 tablet  5   No current facility-administered medications for this visit.    Physical Exam BP 136/75  Pulse 99  Resp 16  Ht 5' 7.5" (1.715 m)  Wt 165 lb (74.844 kg)  BMI 25.45 kg/m2  SpO34 34% 80 year old man in no acute distress Well-developed well-nourished Alert and oriented x3 with no focal neurologic deficits Lungs diminished but equal breath sounds bilaterally, no wheezing No cervical or subclavicular adenopathy Incisions well healed  Diagnostic Tests: CT chest 08/04/2013 CT CHEST WITHOUT CONTRAST  TECHNIQUE:  Multidetector CT imaging of the chest was performed following the  standard protocol without IV contrast.  COMPARISON: CT 03/24/2013  FINDINGS:  View of the lung parenchyma demonstrates postoperative  change in the  left hemi thorax consistent with left lower lobe and resection. No  evidence of nodularity along the resection margin. No suspicious and  nodules within the left or right lung. There is centrilobular  emphysema in the upper lobes.  No axillary or supraclavicular lymphadenopathy. No mediastinal hilar  lymphadenopathy. No pericardial fluid.  Limited upper abdomen demonstrates normal adrenal glands. Limited  view of the skeleton is and the joint no aggressive osseous lesion.  Incidental note of gallstones and renal calculi.  IMPRESSION:  1. No evidence of lung cancer recurrence.  2. Stable postop change in the left lower lobe.  Electronically Signed  By: Suzy Bouchard M.D.  On: 08/04/2013 11:09   Impression: 80 year old gentleman who is now one year out from a superior segmentectomy for  stage IA non-small cell carcinoma. He has no evidence recurrent disease.  At this point we will plan to do CT scans 18 and 24 months postop. After that, we will follow him annually.  His wife mentioned that he frequently has a runny nose. I recommended that he triad Nasonex nasal spray over-the-counter see if that helped.  Plan: Return in 6 months with CT of chest

## 2013-08-12 ENCOUNTER — Other Ambulatory Visit: Payer: Self-pay | Admitting: Emergency Medicine

## 2013-08-13 ENCOUNTER — Other Ambulatory Visit: Payer: Self-pay | Admitting: Emergency Medicine

## 2013-08-14 NOTE — Telephone Encounter (Signed)
Dr Everlene Farrier, do you want to RF this, or want pt to come back for re-check?

## 2013-08-15 NOTE — Telephone Encounter (Signed)
Be sure he does not take the pravastatin while on colchicine. See me if he keeps having trouble

## 2013-08-17 NOTE — Telephone Encounter (Signed)
Called and gave pt instr's. He agreed and has appt sch for next mos.

## 2013-09-01 ENCOUNTER — Encounter (HOSPITAL_COMMUNITY): Payer: Self-pay | Admitting: Emergency Medicine

## 2013-09-01 ENCOUNTER — Emergency Department (HOSPITAL_COMMUNITY)
Admission: EM | Admit: 2013-09-01 | Discharge: 2013-09-02 | Disposition: A | Discharge status: Home / Self Care | Payer: Medicare Other | Attending: Emergency Medicine | Admitting: Emergency Medicine

## 2013-09-01 DIAGNOSIS — J449 Chronic obstructive pulmonary disease, unspecified: Secondary | ICD-10-CM | POA: Diagnosis not present

## 2013-09-01 DIAGNOSIS — Z8619 Personal history of other infectious and parasitic diseases: Secondary | ICD-10-CM | POA: Insufficient documentation

## 2013-09-01 DIAGNOSIS — T465X5A Adverse effect of other antihypertensive drugs, initial encounter: Secondary | ICD-10-CM | POA: Diagnosis not present

## 2013-09-01 DIAGNOSIS — E119 Type 2 diabetes mellitus without complications: Secondary | ICD-10-CM | POA: Diagnosis not present

## 2013-09-01 DIAGNOSIS — N289 Disorder of kidney and ureter, unspecified: Secondary | ICD-10-CM | POA: Diagnosis not present

## 2013-09-01 DIAGNOSIS — E782 Mixed hyperlipidemia: Secondary | ICD-10-CM | POA: Insufficient documentation

## 2013-09-01 DIAGNOSIS — R221 Localized swelling, mass and lump, neck: Secondary | ICD-10-CM | POA: Diagnosis not present

## 2013-09-01 DIAGNOSIS — R220 Localized swelling, mass and lump, head: Secondary | ICD-10-CM | POA: Insufficient documentation

## 2013-09-01 DIAGNOSIS — Z8701 Personal history of pneumonia (recurrent): Secondary | ICD-10-CM | POA: Insufficient documentation

## 2013-09-01 DIAGNOSIS — I4949 Other premature depolarization: Secondary | ICD-10-CM | POA: Insufficient documentation

## 2013-09-01 DIAGNOSIS — M129 Arthropathy, unspecified: Secondary | ICD-10-CM | POA: Insufficient documentation

## 2013-09-01 DIAGNOSIS — Z87891 Personal history of nicotine dependence: Secondary | ICD-10-CM | POA: Insufficient documentation

## 2013-09-01 DIAGNOSIS — Z860100 Personal history of colon polyps, unspecified: Secondary | ICD-10-CM | POA: Diagnosis not present

## 2013-09-01 DIAGNOSIS — Z7982 Long term (current) use of aspirin: Secondary | ICD-10-CM | POA: Insufficient documentation

## 2013-09-01 DIAGNOSIS — I10 Essential (primary) hypertension: Secondary | ICD-10-CM | POA: Diagnosis not present

## 2013-09-01 DIAGNOSIS — J4489 Other specified chronic obstructive pulmonary disease: Secondary | ICD-10-CM | POA: Diagnosis not present

## 2013-09-01 DIAGNOSIS — M109 Gout, unspecified: Secondary | ICD-10-CM | POA: Insufficient documentation

## 2013-09-01 DIAGNOSIS — I491 Atrial premature depolarization: Secondary | ICD-10-CM | POA: Diagnosis not present

## 2013-09-01 DIAGNOSIS — T783XXA Angioneurotic edema, initial encounter: Secondary | ICD-10-CM | POA: Insufficient documentation

## 2013-09-01 DIAGNOSIS — Z85828 Personal history of other malignant neoplasm of skin: Secondary | ICD-10-CM | POA: Insufficient documentation

## 2013-09-01 MED ORDER — FAMOTIDINE 20 MG PO TABS
20.0000 mg | ORAL_TABLET | Freq: Once | ORAL | Status: AC
  Administered 2013-09-01: 20 mg via ORAL
  Filled 2013-09-01: qty 1

## 2013-09-01 MED ORDER — PREDNISONE 20 MG PO TABS
60.0000 mg | ORAL_TABLET | Freq: Once | ORAL | Status: AC
  Administered 2013-09-01: 60 mg via ORAL
  Filled 2013-09-01: qty 3

## 2013-09-01 MED ORDER — PREDNISONE 20 MG PO TABS: 40.0000 mg | ORAL_TABLET | Freq: Once | ORAL | Status: DC

## 2013-09-01 MED ORDER — METHYLPREDNISOLONE SODIUM SUCC 125 MG IJ SOLR
125.0000 mg | Freq: Once | INTRAMUSCULAR | Status: AC
  Administered 2013-09-01: 125 mg via INTRAVENOUS
  Filled 2013-09-01: qty 2

## 2013-09-01 NOTE — ED Notes (Signed)
MD at bedside. 

## 2013-09-01 NOTE — Discharge Instructions (Signed)
Please take benadryl every 6 hours for the next 2 days then every 6 hours as needed after that.  Angioedema Angioedema is a sudden swelling of tissues, often of the skin. It can occur on the face or genitals or in the abdomen or other body parts. The swelling usually develops over a short period and gets better in 24 to 48 hours. It often begins during the night and is found when the person wakes up. The person may also get red, itchy patches of skin (hives). Angioedema can be dangerous if it involves swelling of the air passages.  Depending on the cause, episodes of angioedema may only happen once, come back in unpredictable patterns, or repeat for several years and then gradually fade away.  CAUSES  Angioedema can be caused by an allergic reaction to various triggers. It can also result from nonallergic causes, including reactions to drugs, immune system disorders, viral infections, or an abnormal gene that is passed to you from your parents (hereditary). For some people with angioedema, the cause is unknown.  Some things that can trigger angioedema include:   Foods.   Medicines, such as ACE inhibitors, ARBs, nonsteroidal anti-inflammatory agents, or estrogen.   Latex.   Animal saliva.   Insect stings.   Dyes used in X-rays.   Mild injury.   Dental work.  Surgery.  Stress.   Sudden changes in temperature.   Exercise. SIGNS AND SYMPTOMS   Swelling of the skin.  Hives. If these are present, there is also intense itching.  Redness in the affected area.   Pain in the affected area.  Swollen lips or tongue.  Breathing problems. This may happen if the air passages swell.  Wheezing. If internal organs are involved, there may be:   Nausea.   Abdominal pain.   Vomiting.   Difficulty swallowing.   Difficulty passing urine. DIAGNOSIS   Your health care provider will examine the affected area and take a medical and family history.  Various tests may  be done to help determine the cause. Tests may include:  Allergy skin tests to see if the problem is an allergic reaction.   Blood tests to check for hereditary angioedema.   Tests to check for underlying diseases that could cause the condition.   A review of your medicines, including over-the-counter medicines, may be done. TREATMENT  Treatment will depend on the cause of the angioedema. Possible treatments include:   Removal of anything that triggered the condition (such as stopping certain medicines).   Medicines to treat symptoms or prevent attacks. Medicines given may include:   Antihistamines.   Epinephrine injection.   Steroids.   Hospitalization may be required for severe attacks. If the air passages are affected, it can be an emergency. Tubes may need to be placed to keep the airway open. HOME CARE INSTRUCTIONS   Take all medicines as directed by your health care provider.  If you were given medicines for emergency allergy treatment, always carry them with you.  Wear a medical bracelet as directed by your health care provider.   Avoid known triggers. SEEK MEDICAL CARE IF:   You have repeat attacks of angioedema.   Your attacks are more frequent or more severe despite preventive measures.   You have hereditary angioedema and are considering having children. It is important to discuss with your health care provider the risks of passing the condition on to your children. SEEK IMMEDIATE MEDICAL CARE IF:   You have severe swelling of the  mouth, tongue, or lips.  You have difficulty breathing.   You have difficulty swallowing.   You faint. MAKE SURE YOU:  Understand these instructions.  Will watch your condition.  Will get help right away if you are not doing well or get worse. Document Released: 03/12/2001 Document Revised: 05/18/2013 Document Reviewed: 08/25/2012 [facility] Patient Information 2015 [place], [place]. This information is not  intended to replace advice given to you by your health care provider. Make sure you discuss any questions you have with your health care provider.

## 2013-09-01 NOTE — ED Notes (Signed)
Dr Lowella Fairy at bedside. Patient c/o allergic reaction with SOB and tongue swelling. Patient has taken 2 benadryl at home PTA. Patient is able to speak at this time without difficulty. Face is flushed.

## 2013-09-01 NOTE — ED Notes (Signed)
Patient states he took his antihypertensive medications PTA.

## 2013-09-01 NOTE — ED Provider Notes (Signed)
CSN: [id number]     Arrival date & time 09/01/13  2128 History   First MD Initiated Contact with Patient 09/01/13 2133     Chief Complaint  Patient presents with  . Allergic Reaction    tongue swelling     (Consider location/radiation/quality/duration/timing/severity/associated sxs/prior Treatment) Patient is a 80 y.o. male presenting with allergic reaction. The history is provided by the patient.  Allergic Reaction Presenting symptoms: swelling (tongue)   Swelling:    Location:  Mouth   Onset quality:  Sudden   Timing:  Constant   Progression:  Improving Severity:  Mild Prior allergic episodes:  Allergies to medications Context: no animal exposure, no medications, no new detergents/soaps and no nuts   Relieved by:  Antihistamines Worsened by:  Nothing tried   Past Medical History  Diagnosis Date  . Mixed hyperlipidemia     takes Pravastatin daily  . Gout     no meds required  . Abnormal prostate exam   . Carotid stenosis   . Cholelithiasis   . Renal insufficiency   . Renal artery stenosis     right  . PAC (premature atrial contraction)   . PVCs (premature ventricular contractions)   . Essential hypertension, benign     takes Clonodine,Losartan,and Metoprolol and HCTZ  . Carotid stenosis right side    Dr.Early does carotid ultrasounds yrly-last one Mar 14-report in epic  . Asthma     as a child  . COPD (chronic obstructive pulmonary disease)     slight   . History of bronchitis   . Pneumonia 02/08/2012  . History of colon polyps   . Enlarged prostate     slightly   . Type II or unspecified type diabetes mellitus without mention of complication, not stated as uncontrolled     takes Metformin daily  . History of shingles   . Skin cancer     Lung Ca- Left Lung  . Arthritis     Gout    Past Surgical History  Procedure Laterality Date  . No past surgeries    . Colonoscopy    . Video assisted thoracoscopy Left 06/16/2012    Procedure: VIDEO ASSISTED  THORACOSCOPY;  Surgeon: [name], MD;  Location: [facility];  Service: Thoracic;  Laterality: Left;  . Segmentecomy Left 06/16/2012    Procedure: left lower lobe superior SEGMENTECTOMY with node dissection;  Surgeon: [name], MD;  Location: [facility];  Service: Thoracic;  Laterality: Left;  left superior   Family History  Problem Relation Age of Onset  . Heart disease Mother   . Diabetes Mother     AKA  Right   . Hypertension Mother   . Varicose Veins Mother   . Kidney disease Mother     One Kidney  . Cancer Father     Lung   History  Substance Use Topics  . Smoking status: Former Smoker -- 1.50 packs/day for 30 years    Types: Cigarettes    Quit date: 01/16/1976  . Smokeless tobacco: Never Used  . Alcohol Use: No    Review of Systems  Constitutional: Negative for fever and chills.  Respiratory: Negative for cough and shortness of breath.   Gastrointestinal: Negative for vomiting and abdominal pain.  All other systems reviewed and are negative.     Allergies  Ace inhibitors; Codeine; Indomethacin; and Losartan  Home Medications   Prior to Admission medications   Medication Sig Start Date End Date Taking? Authorizing Provider  aspirin EC 81 MG tablet Take 81 mg by mouth every evening.   Yes Historical Provider, MD  cloNIDine (CATAPRES) 0.2 MG tablet Take 0.2 mg by mouth 2 (two) times daily.   Yes Historical Provider, MD  colchicine 0.6 MG tablet Take 0.6 mg by mouth See admin instructions. Take one tablet three times a day for one day, then take one tablet twice daily.   Yes Historical Provider, MD  hydrochlorothiazide (HYDRODIURIL) 25 MG tablet Take 25 mg by mouth daily.   Yes Historical Provider, MD  metFORMIN (GLUCOPHAGE) 500 MG tablet Take 500 mg by mouth 2 (two) times daily.   Yes Historical Provider, MD  metoprolol succinate (TOPROL-XL) 100 MG 24 hr tablet Take 100 mg by mouth daily. Take with or immediately following a meal.   Yes Historical Provider,  MD  niacin (NIASPAN) 500 MG CR tablet Take 500 mg by mouth at bedtime.   Yes Historical Provider, MD  pravastatin (PRAVACHOL) 20 MG tablet Take 20 mg by mouth daily.   Yes Historical Provider, MD   BP 172/84  Pulse 84  Temp(Src) 97.9 F (36.6 C) (Oral)  Resp 18  Ht 5' 8.5" (1.74 m)  Wt 167 lb (75.751 kg)  BMI 25.02 kg/m2  SpO2 96% Physical Exam  Constitutional: He is oriented to person, place, and time. He appears well-developed and well-nourished. No distress.  HENT:  Head: Normocephalic and atraumatic.  Mouth/Throat: Oropharynx is clear and moist. No oropharyngeal exudate.  Mild tongue edema, no stridor, no other airway edema.   Eyes: EOM are normal. Pupils are equal, round, and reactive to light.  Neck: Normal range of motion. Neck supple.  Cardiovascular: Normal rate and regular rhythm.  Exam reveals no friction rub.   No murmur heard. Pulmonary/Chest: Effort normal and breath sounds normal. No respiratory distress. He has no wheezes. He has no rales.  Abdominal: He exhibits no distension. There is no tenderness. There is no rebound.  Musculoskeletal: Normal range of motion. He exhibits no edema.  Neurological: He is alert and oriented to person, place, and time.  Skin: He is not diaphoretic.    ED Course  Procedures (including critical care time) Labs Review Labs Reviewed - No data to display  Imaging Review No results found.   EKG Interpretation None      MDM   Final diagnoses:  Angioedema, initial encounter    80 year old male with history of angioedema. Here with tongue swelling, started about 3 hours prior to arrival. Improving with Benadryl. This is milder than previous episodes. No wheezing, shortness of breath, no stridor. He was suspected he was allergic to ACE inhibitors and ARB use it, so he is not currently on these. No need for epinephrine. Given prednisone, H1 blocker. On reexam he is improving. Tongue swelling is decreased. No lip or other airway  swelling noted. On repeat exam further improvement. Stable for discharge. Will discharge with prednisone, instructed to f/u with his PCP.    Evelina Bucy, MD 09/01/13 [phone]

## 2013-09-03 ENCOUNTER — Encounter: Payer: Self-pay | Admitting: Emergency Medicine

## 2013-09-03 ENCOUNTER — Ambulatory Visit (INDEPENDENT_AMBULATORY_CARE_PROVIDER_SITE_OTHER): Payer: Medicare Other | Admitting: Emergency Medicine

## 2013-09-03 VITALS — BP 110/52 | HR 64 | Temp 97.7°F | Resp 16 | Ht 68.5 in | Wt 166.4 lb

## 2013-09-03 DIAGNOSIS — I6529 Occlusion and stenosis of unspecified carotid artery: Secondary | ICD-10-CM

## 2013-09-03 DIAGNOSIS — Z5189 Encounter for other specified aftercare: Secondary | ICD-10-CM | POA: Diagnosis not present

## 2013-09-03 DIAGNOSIS — T783XXD Angioneurotic edema, subsequent encounter: Secondary | ICD-10-CM

## 2013-09-03 DIAGNOSIS — T783XXA Angioneurotic edema, initial encounter: Secondary | ICD-10-CM | POA: Diagnosis not present

## 2013-09-03 NOTE — Progress Notes (Signed)
Subjective:  This chart was scribed for Benjamin Queen, MD by Benjamin Patel, Medical Scribe. This patient was seen in Room 21 and the patient's care was started at 11:40 AM.   Patient ID: Benjamin Patel, male    DOB: [birth year], 80 y.o.   MRN: [id number]  HPI HPI Comments: Benjamin Patel is a 80 y.o. male with a history of recurrent angioedema who presents to the [facility] for a follow-up visit after he was hospitalized for angioedema.  He believes that his condition was aggravated by eating 2 pieces of veal bratwurst because he experienced these symptoms a year ago after he ate knockwurst bratwurst.  He experienced some mild tongue swelling upon the onset of his symptoms but did not have any trouble breathing or swallowing.  He was discharged after 3 hours of being in the ED and is feeling fine now.     Past Medical History  Diagnosis Date  . Mixed hyperlipidemia     takes Pravastatin daily  . Gout     no meds required  . Abnormal prostate exam   . Carotid stenosis   . Cholelithiasis   . Renal insufficiency   . Renal artery stenosis     right  . PAC (premature atrial contraction)   . PVCs (premature ventricular contractions)   . Essential hypertension, benign     takes Clonodine,Losartan,and Metoprolol and HCTZ  . Carotid stenosis right side    Dr.Early does carotid ultrasounds yrly-last one Mar 14-report in epic  . Asthma     as a child  . COPD (chronic obstructive pulmonary disease)     slight   . History of bronchitis   . Pneumonia 02/08/2012  . History of colon polyps   . Enlarged prostate     slightly   . Type II or unspecified type diabetes mellitus without mention of complication, not stated as uncontrolled     takes Metformin daily  . History of shingles   . Skin cancer     Lung Ca- Left Lung  . Arthritis     Gout    Past Surgical History  Procedure Laterality Date  . No past surgeries    . Colonoscopy    . Video assisted thoracoscopy  Left 06/16/2012    Procedure: VIDEO ASSISTED THORACOSCOPY;  Surgeon: Benjamin Nakayama, MD;  Location: [place];  Service: Thoracic;  Laterality: Left;  . Segmentecomy Left 06/16/2012    Procedure: left lower lobe superior SEGMENTECTOMY with node dissection;  Surgeon: Benjamin Nakayama, MD;  Location: [place];  Service: Thoracic;  Laterality: Left;  left superior   Family History  Problem Relation Age of Onset  . Heart disease Mother   . Diabetes Mother     AKA  Right   . Hypertension Mother   . Varicose Veins Mother   . Kidney disease Mother     One Kidney  . Cancer Father     Lung   History   Social History  . Marital Status: Married    Spouse Name: N/A    Number of Children: 3  . Years of Education: N/A   Occupational History  . retired     Herbalist   Social History Main Topics  . Smoking status: Former Smoker -- 1.50 packs/day for 30 years    Types: Cigarettes    Quit date: 01/16/1976  . Smokeless tobacco: Never Used  . Alcohol Use: No  . Drug Use: No  .  Sexual Activity: Not on file   Other Topics Concern  . Not on file   Social History Narrative  . No narrative on file   Allergies  Allergen Reactions  . Ace Inhibitors Cough  . Codeine Other (See Comments)    GI Upset  . Indomethacin Other (See Comments)    Elevated BP  . Losartan Swelling    ANGIOEDEMA/     Review of Systems   Objective:  Physical Exam  Nursing note and vitals reviewed. Constitutional: He is oriented to person, place, and time. He appears well-developed and well-nourished.  HENT:  Head: Normocephalic and atraumatic.  Right Ear: External ear normal.  Left Ear: External ear normal.  Nose: Nose normal.  Mouth/Throat: Oropharynx is clear and moist. No oropharyngeal exudate.  Eyes: Conjunctivae and EOM are normal. Pupils are equal, round, and reactive to light.  Neck: Normal range of motion. Neck supple. No thyromegaly present.  Cardiovascular: Normal rate, regular rhythm and  normal heart sounds.  Exam reveals no gallop and no friction rub.   No murmur heard. Pulmonary/Chest: Effort normal and breath sounds normal. No respiratory distress. He has no wheezes. He has no rales.  Abdominal: Soft. Bowel sounds are normal. There is no tenderness.  Musculoskeletal: Normal range of motion.  Lymphadenopathy:    He has no cervical adenopathy.  Neurological: He is alert and oriented to person, place, and time.  Skin: Skin is warm and dry.  Psychiatric: He has a normal mood and affect. His behavior is normal.     BP 110/52  Pulse 64  Temp(Src) 97.7 F (36.5 C) (Oral)  Resp 16  Ht 5' 8.5" (1.74 m)  Wt 166 lb 6.4 oz (75.479 kg)  BMI 24.93 kg/m2  SpO2 97% Assessment & Plan:  There is obviously some thinning in the sausage worse or bratwurst that he is sensitive to. He is to avoid all these products. He will contact the restaurant and see what they are adding to their bratwurst .  I personally performed the services described in this documentation, which was scribed in my presence. The recorded information has been reviewed and is accurate.

## 2013-09-22 ENCOUNTER — Encounter: Payer: Self-pay | Admitting: Family

## 2013-09-23 ENCOUNTER — Encounter: Payer: Medicare Other | Admitting: Family

## 2013-09-23 ENCOUNTER — Ambulatory Visit (HOSPITAL_COMMUNITY)
Admission: RE | Admit: 2013-09-23 | Discharge: 2013-09-23 | Disposition: A | Discharge status: Home / Self Care | Payer: Medicare Other | Source: Ambulatory Visit | Attending: Family | Admitting: Family

## 2013-09-23 DIAGNOSIS — I658 Occlusion and stenosis of other precerebral arteries: Secondary | ICD-10-CM | POA: Diagnosis not present

## 2013-09-23 DIAGNOSIS — I6529 Occlusion and stenosis of unspecified carotid artery: Secondary | ICD-10-CM | POA: Diagnosis not present

## 2013-09-24 NOTE — Patient Instructions (Signed)
Dear Benjamin Patel,  Your recent visit Sept. 9, 2015 with the Vascular Lab. Indicates: NO significant change in comparison to the last exam on March 23, 2013  Please follow up with [place] in [place] !           Stroke Prevention Some medical conditions and behaviors are associated with an increased chance of having a stroke. You may prevent a stroke by making healthy choices and managing medical conditions. HOW CAN I REDUCE MY RISK OF HAVING A STROKE?   Stay physically active. Get at least 30 minutes of activity on most or all days.  Do not smoke. It may also be helpful to avoid exposure to secondhand smoke.  Limit alcohol use. Moderate alcohol use is considered to be:  No more than 2 drinks per day for men.  No more than 1 drink per day for nonpregnant women.  Eat healthy foods. This involves:  Eating 5 or more servings of fruits and vegetables a day.  Making dietary changes that address high blood pressure (hypertension), high cholesterol, diabetes, or obesity.  Manage your cholesterol levels.  Making food choices that are high in fiber and low in saturated fat, trans fat, and cholesterol may control cholesterol levels.  Take any prescribed medicines to control cholesterol as directed by your health care provider.  Manage your diabetes.  Controlling your carbohydrate and sugar intake is recommended to manage diabetes.  Take any prescribed medicines to control diabetes as directed by your health care provider.  Control your hypertension.  Making food choices that are low in salt (sodium), saturated fat, trans fat, and cholesterol is recommended to manage hypertension.  Take any prescribed medicines to control hypertension as directed by your health care provider.  Maintain a healthy weight.  Reducing calorie intake and making food choices that are low in sodium, saturated fat, trans fat, and cholesterol are recommended to manage weight.  Stop drug abuse.  Avoid  taking birth control pills.  Talk to your health care provider about the risks of taking birth control pills if you are over 50 years old, smoke, get migraines, or have ever had a blood clot.  Get evaluated for sleep disorders (sleep apnea).  Talk to your health care provider about getting a sleep evaluation if you snore a lot or have excessive sleepiness.  Take medicines only as directed by your health care provider.  For some people, aspirin or blood thinners (anticoagulants) are helpful in reducing the risk of forming abnormal blood clots that can lead to stroke. If you have the irregular heart rhythm of atrial fibrillation, you should be on a blood thinner unless there is a good reason you cannot take them.  Understand all your medicine instructions.  Make sure that other conditions (such as anemia or atherosclerosis) are addressed. SEEK IMMEDIATE MEDICAL CARE IF:   You have sudden weakness or numbness of the face, arm, or leg, especially on one side of the body.  Your face or eyelid droops to one side.  You have sudden confusion.  You have trouble speaking (aphasia) or understanding.  You have sudden trouble seeing in one or both eyes.  You have sudden trouble walking.  You have dizziness.  You have a loss of balance or coordination.  You have a sudden, severe headache with no known cause.  You have new chest pain or an irregular heartbeat. Any of these symptoms may represent a serious problem that is an emergency. Do not wait to see if the  symptoms will go away. Get medical help at once. Call your local emergency services (911 in [place] Do not drive yourself to the hospital. Document Released: 02/09/2004 Document Revised: 05/18/2013 Document Reviewed: 07/04/2012 [facility] Patient Information 2015 [place], [place]. This information is not intended to replace advice given to you by your health care provider. Make sure you discuss any questions you have with your health care  provider.

## 2013-09-25 ENCOUNTER — Encounter: Payer: Self-pay | Admitting: Vascular Surgery

## 2013-09-25 NOTE — Progress Notes (Signed)
Lab only 

## 2013-09-29 ENCOUNTER — Ambulatory Visit (INDEPENDENT_AMBULATORY_CARE_PROVIDER_SITE_OTHER): Payer: Medicare Other | Admitting: Emergency Medicine

## 2013-09-29 ENCOUNTER — Encounter: Payer: Self-pay | Admitting: Emergency Medicine

## 2013-09-29 VITALS — BP 135/80 | HR 74 | Temp 97.4°F | Resp 16 | Ht 68.5 in | Wt 166.0 lb

## 2013-09-29 DIAGNOSIS — Z23 Encounter for immunization: Secondary | ICD-10-CM | POA: Diagnosis not present

## 2013-09-29 DIAGNOSIS — E119 Type 2 diabetes mellitus without complications: Secondary | ICD-10-CM | POA: Diagnosis not present

## 2013-09-29 DIAGNOSIS — I6529 Occlusion and stenosis of unspecified carotid artery: Secondary | ICD-10-CM

## 2013-09-29 LAB — POCT GLYCOSYLATED HEMOGLOBIN (HGB A1C): Hemoglobin A1C: 6

## 2013-09-29 LAB — GLUCOSE, POCT (MANUAL RESULT ENTRY): POC Glucose: 130 mg/dl — AB (ref 70–99)

## 2013-09-29 NOTE — Progress Notes (Addendum)
This chart was scribed for Benjamin Russian, MD by Benjamin Patel, Medical Scribe. This patient was seen in room 22 and the patient's care was started at 8:48 AM.  Subjective:    Patient ID: Benjamin Patel, male    DOB: April 23, 1927, 80 y.o.   MRN: [id number]  Chief Complaint  Patient presents with  . Follow-up    3 month    HPI Benjamin Patel is a 80 y.o. male with a hx of DM. Pt was last seen by me 09/03/2013 for a follow-up visit after he was hospitalized for angioedema. At his last visit he believed that his condition was aggravated by eating 2 pieces of veal bratwurst.  Today, pt states that he is doing well and has not had any swelling. Pt states that he currently fond out that he was not the only one who got sick after eating the bratwurst. He states that the health department is following up, to see what spices were used to prepare the meat.   Vaccines: pt states that his flu vaccine is up to date Vision: pt states that he does not have a doctor so he would like a referral to a local specialist.  Colonoscopy: he endorses it being up to date.  [name] states that occasionally he experienced pain in his foot. He states that the pain is localized to his right great toe. Pt endorses wearing good shoes. He denies any SOB, chest pain, or any other pertinent medical history.   Pt would like the result of his carotid study, because he states that after his last visit he was never given an after visit summary. Will do as per pt request.    Patient Active Problem List   Diagnosis Date Noted  . Type II or unspecified type diabetes mellitus without mention of complication, not stated as uncontrolled 02/10/2013  . Lung cancer 02/10/2013  . Carotid arterial disease 02/10/2013  . Coronary artery calcification 12/22/2012  . Atrial fibrillation 06/23/2012  . CKD (chronic kidney disease) stage 2, GFR 60-89 ml/min 06/19/2012  . COPD/ GOLD III 06/17/2012  . DM (diabetes mellitus) type II  controlled with renal manifestation 06/17/2012  . Occlusion and stenosis of carotid artery without mention of cerebral infarction 03/20/2012  . Non-small cell carcinoma of lung, stage 1 02/10/2012  . Essential hypertension, benign   . Mixed hyperlipidemia   . Gout   . DM (diabetes mellitus), type 2 with peripheral vascular complications   . Carotid stenosis   . Cholelithiasis   . Renal insufficiency   . Renal artery stenosis    Past Medical History  Diagnosis Date  . Mixed hyperlipidemia     takes Pravastatin daily  . Gout     no meds required  . Abnormal prostate exam   . Carotid stenosis   . Cholelithiasis   . Renal insufficiency   . Renal artery stenosis     right  . PAC (premature atrial contraction)   . PVCs (premature ventricular contractions)   . Essential hypertension, benign     takes Clonodine,Losartan,and Metoprolol and HCTZ  . Carotid stenosis right side    Dr.Early does carotid ultrasounds yrly-last one Mar 14-report in epic  . Asthma     as a child  . COPD (chronic obstructive pulmonary disease)     slight   . History of bronchitis   . Pneumonia 02/08/2012  . History of colon polyps   . Enlarged prostate     slightly   .  Type II or unspecified type diabetes mellitus without mention of complication, not stated as uncontrolled     takes Metformin daily  . History of shingles   . Skin cancer     Lung Ca- Left Lung  . Arthritis     Gout    Past Surgical History  Procedure Laterality Date  . No past surgeries    . Colonoscopy    . Video assisted thoracoscopy Left 06/16/2012    Procedure: VIDEO ASSISTED THORACOSCOPY;  Surgeon: [name], MD;  Location: [place];  Service: Thoracic;  Laterality: Left;  . Segmentecomy Left 06/16/2012    Procedure: left lower lobe superior SEGMENTECTOMY with node dissection;  Surgeon: [name], MD;  Location: [place];  Service: Thoracic;  Laterality: Left;  left superior   Allergies  Allergen Reactions  .  Ace Inhibitors Cough  . Codeine Other (See Comments)    GI Upset  . Indomethacin Other (See Comments)    Elevated BP  . Losartan Swelling    ANGIOEDEMA/    Prior to Admission medications   Medication Sig Start Date End Date Taking? Authorizing Provider  aspirin EC 81 MG tablet Take 81 mg by mouth every evening.   Yes Historical Provider, MD  cloNIDine (CATAPRES) 0.2 MG tablet Take 0.2 mg by mouth 2 (two) times daily.   Yes Historical Provider, MD  hydrochlorothiazide (HYDRODIURIL) 25 MG tablet Take 25 mg by mouth daily.   Yes Historical Provider, MD  metFORMIN (GLUCOPHAGE) 500 MG tablet Take 500 mg by mouth 2 (two) times daily.   Yes Historical Provider, MD  metoprolol succinate (TOPROL-XL) 100 MG 24 hr tablet Take 100 mg by mouth daily. Take with or immediately following a meal.   Yes Historical Provider, MD  niacin (NIASPAN) 500 MG CR tablet Take 500 mg by mouth at bedtime.   Yes Historical Provider, MD  pravastatin (PRAVACHOL) 20 MG tablet Take 20 mg by mouth daily.   Yes Historical Provider, MD   History   Social History  . Marital Status: Married    Spouse Name: N/A    Number of Children: 3  . Years of Education: N/A   Occupational History  . retired     Herbalist   Social History Main Topics  . Smoking status: Former Smoker -- 1.50 packs/day for 30 years    Types: Cigarettes    Quit date: 01/16/1976  . Smokeless tobacco: Never Used  . Alcohol Use: No  . Drug Use: No  . Sexual Activity: Not on file   Other Topics Concern  . Not on file   Social History Narrative  . No narrative on file   Review of Systems  Constitutional: Negative for fatigue and unexpected weight change.  Eyes: Negative for visual disturbance.  Respiratory: Negative for cough, chest tightness and shortness of breath.   Cardiovascular: Negative for chest pain, palpitations and leg swelling.  Gastrointestinal: Negative for abdominal pain and blood in stool.  Neurological: Negative for  dizziness, light-headedness and headaches.   Objective:   Physical Exam  Nursing note and vitals reviewed. CONSTITUTIONAL: Well developed/well nourished HEAD: Normocephalic/atraumatic EYES: EOMI/PERRL ENMT: Mucous membranes moist NECK: supple no meningeal signs SPINE:entire spine nontender CV: S1/S2 noted, no murmurs/rubs/gallops noted LUNGS: Lungs are clear to auscultation bilaterally, no apparent distress ABDOMEN: soft, nontender, no rebound or guarding GU:no cva tenderness NEURO: Pt is awake/alert, moves all extremitiesx4 EXTREMITIES: pulses normal, full ROM SKIN: warm, color normal, dry scaly skin noted to his  lower extremities.  PSYCH: no abnormalities of mood noted  Filed Vitals:   09/29/13 0840  BP: 135/80  Pulse: 74  Temp: 97.4 F (36.3 C)  Resp: 16  Height: 5' 8.5" (1.74 m)  Weight: 166 lb (75.297 kg)  SpO2: 97%   Results for orders placed in visit on 09/29/13  GLUCOSE, POCT (MANUAL RESULT ENTRY)      Result Value Ref Range   POC Glucose 130 (*) 70 - 99 mg/dl  POCT GLYCOSYLATED HEMOGLOBIN (HGB A1C)      Result Value Ref Range   Hemoglobin A1C 6.0        Assessment & Plan:  Diabetes under perfect control. His carotid studies are stable. No evidence of lung cancer recurrence. Recheck 3-4 months. Flu shot was given . Referral made to Dr. Zadie Rhine for diabetic eye check   I personally performed the services described in this documentation, which was scribed in my presence. The recorded information has been reviewed and is accurate.

## 2013-10-02 ENCOUNTER — Ambulatory Visit (INDEPENDENT_AMBULATORY_CARE_PROVIDER_SITE_OTHER): Payer: Medicare Other | Admitting: Family Medicine

## 2013-10-02 ENCOUNTER — Other Ambulatory Visit: Payer: Self-pay | Admitting: Emergency Medicine

## 2013-10-02 VITALS — BP 132/60 | HR 90 | Temp 97.3°F | Resp 16 | Ht 68.5 in | Wt 166.0 lb

## 2013-10-02 DIAGNOSIS — R350 Frequency of micturition: Secondary | ICD-10-CM | POA: Diagnosis not present

## 2013-10-02 DIAGNOSIS — N390 Urinary tract infection, site not specified: Secondary | ICD-10-CM | POA: Diagnosis not present

## 2013-10-02 DIAGNOSIS — I6529 Occlusion and stenosis of unspecified carotid artery: Secondary | ICD-10-CM

## 2013-10-02 LAB — POCT UA - MICROSCOPIC ONLY
CASTS, UR, LPF, POC: NEGATIVE
Crystals, Ur, HPF, POC: NEGATIVE
MUCUS UA: NEGATIVE
Yeast, UA: NEGATIVE

## 2013-10-02 LAB — POCT URINALYSIS DIPSTICK
Bilirubin, UA: NEGATIVE
Glucose, UA: NEGATIVE
Ketones, UA: NEGATIVE
NITRITE UA: NEGATIVE
PH UA: 6
PROTEIN UA: 100
SPEC GRAV UA: 1.02
Urobilinogen, UA: 0.2

## 2013-10-02 MED ORDER — AMOXICILLIN 500 MG PO CAPS: 500.0000 mg | ORAL_CAPSULE | Freq: Three times a day (TID) | ORAL | Status: DC

## 2013-10-02 MED ORDER — PHENAZOPYRIDINE HCL 100 MG PO TABS: ORAL_TABLET | ORAL | Status: DC

## 2013-10-02 NOTE — Progress Notes (Signed)
Subjective:  Patient is here with history of having urinary frequency since yesterday. He is not having as much problem with any discomfort as just frequency. No fever or chills. No flank pain or abdominal pains. He had a urinary tract infection treated in July of this year. He has not had a long history of problems with this.  Objective: No CVA tenderness. Abdomen soft without mass or tenderness. He is a healthy-appearing male he is circumcised.  Assessment: Urinary tract infection  Plan: I reviewed his culture reports from 2 months ago. Decided to go with amoxicillin.

## 2013-10-02 NOTE — Patient Instructions (Signed)
Drink lots of water  Return if in all worse such as fever, chills, abdominal or flank pain, nausea, etc. Go to the emergency room if abruptly ill.  Take amoxicillin 1 pill 3 times daily  Take the Pyridium 1 pill 3 times daily for one to 2 day to help the frequency and irritation  Return several days after finishing the antibiotic to get a followup urinalysis check

## 2013-10-10 ENCOUNTER — Other Ambulatory Visit (INDEPENDENT_AMBULATORY_CARE_PROVIDER_SITE_OTHER): Payer: Medicare Other | Admitting: *Deleted

## 2013-10-10 DIAGNOSIS — N390 Urinary tract infection, site not specified: Secondary | ICD-10-CM | POA: Diagnosis not present

## 2013-10-10 LAB — POCT URINALYSIS DIPSTICK
Bilirubin, UA: NEGATIVE
Glucose, UA: NEGATIVE
KETONES UA: NEGATIVE
LEUKOCYTES UA: NEGATIVE
Nitrite, UA: NEGATIVE
PH UA: 5.5
PROTEIN UA: NEGATIVE
Spec Grav, UA: 1.01
UROBILINOGEN UA: 0.2

## 2013-10-10 LAB — POCT UA - MICROSCOPIC ONLY
Bacteria, U Microscopic: NEGATIVE
CASTS, UR, LPF, POC: NEGATIVE
CRYSTALS, UR, HPF, POC: NEGATIVE
Mucus, UA: NEGATIVE
RBC, urine, microscopic: NEGATIVE
YEAST UA: NEGATIVE

## 2013-10-10 IMAGING — PT NM PET TUM IMG INITIAL (PI) SKULL BASE T - THIGH
6 series · 25 of 25 positions shown · non-contrast
Comparison: none

CLINICAL DATA: Initial treatment strategy for lung nodule.

NUCLEAR MEDICINE PET SKULL BASE TO THIGH
Fasting Blood Glucose:  130
TECHNIQUE: 18.7 mCi F-18 FDG was injected intravenously.   CT data
was obtained and used for attenuation correction and anatomic
localization only.  (This was not acquired as a diagnostic CT
examination.) Additional exam technical data entered  on
technologist worksheet.

[Series 1: pet ac · axial · 3.3mm · 4.69mm/px · z∈[-880,-10]mm · 5 of 267 slices shown]
[im 1/267]
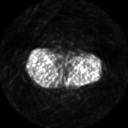
[im 67/267]
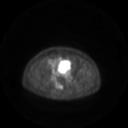
[im 134/267]
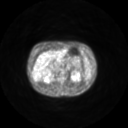
[im 200/267]
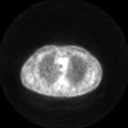
[im 267/267]
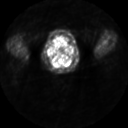

[Series 2: ct images · axial · 3.8mm · 0.98mm/px · z∈[-880,-10]mm · 6 of 267 slices shown]
[im 1/267]
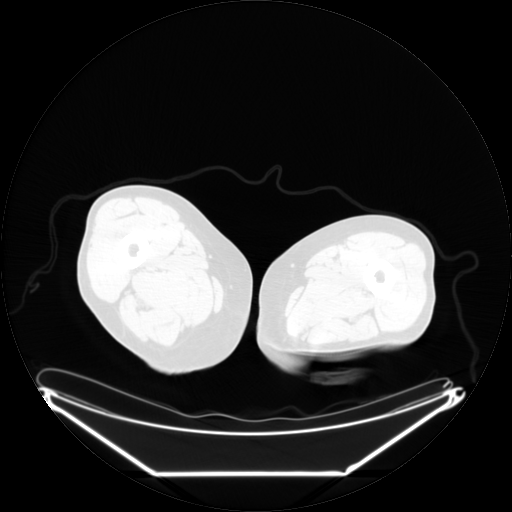
[im 54/267]
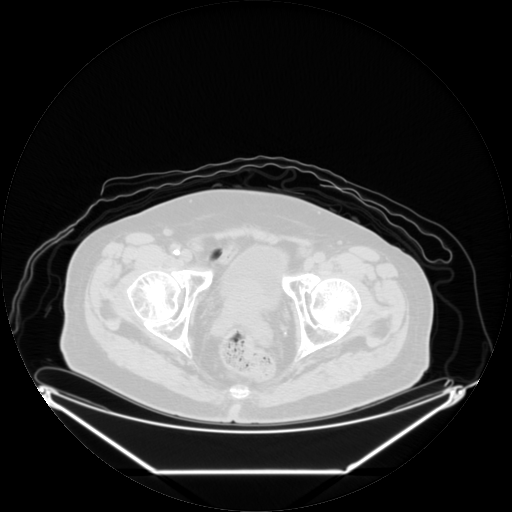
[im 107/267]
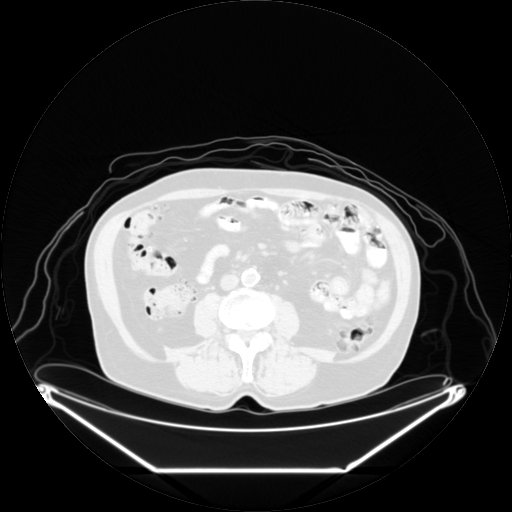
[im 160/267]
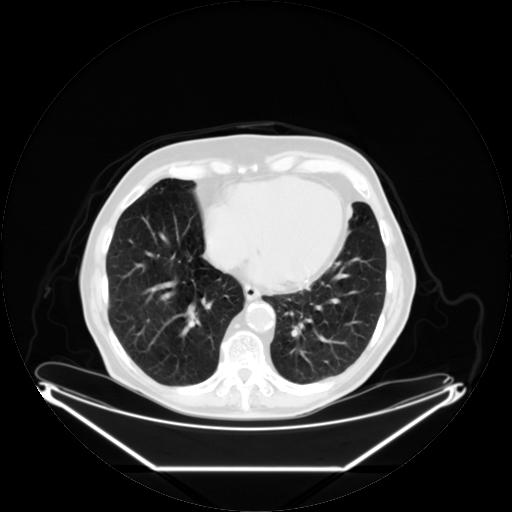
[im 213/267]
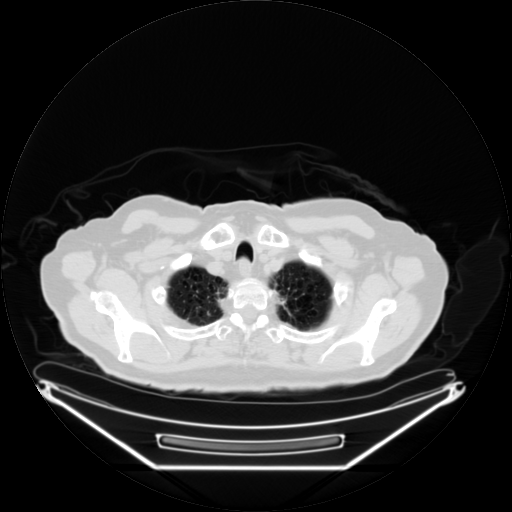
[im 267/267  brain]
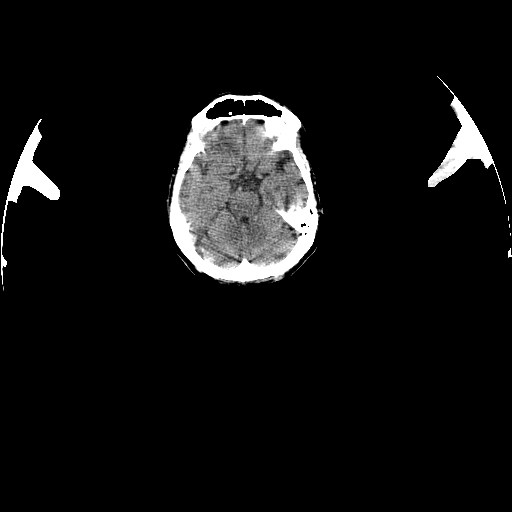

[Series 2: pet nac · axial · 3.3mm · 4.69mm/px · z∈[-880,-10]mm · 6 of 267 slices shown]
[im 1/267]
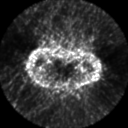
[im 54/267]
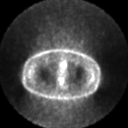
[im 107/267]
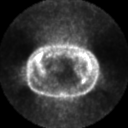
[im 160/267]
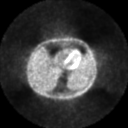
[im 213/267]
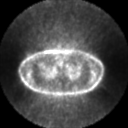
[im 267/267]
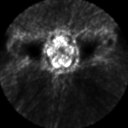

[Series 123: mip · coronal · 3.3mm · 4.69mm/px · 1 of 30 slices shown]
[im 1/30]
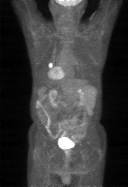

[Series 151: reformatted · axial · 3.3mm · 3.91mm/px · z∈[-880,-10]mm · 6 of 265 slices shown (1 of 2)]
[im 1/265]
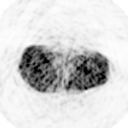
[im 53/265]
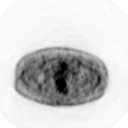
[im 106/265]
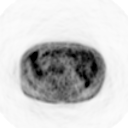
[im 159/265]
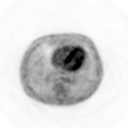
[im 212/265]
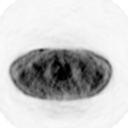
[im 265/265]
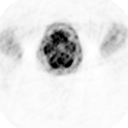

[Series 153: reformatted · coronal · 4.7mm · 6.98mm/px · 1 of 65 slices shown (2 of 2)]
[im 1/65]
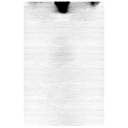

[25 of 25 positions shown; findings below may reference images not displayed]

FINDINGS: Neck: No hypermetabolic nodes in the neck.

Chest: Within the left lower lobe, there is a round 21 x 20 mm
nodule with apparent increase in size from 17 x 15 mm on CT of
02/08/2012.  This lesion is intensely hypermetabolic with SUV max =
14.6.  There are no additional hypermetabolic pulmonary nodules.

There are no hypermetabolic mediastinal lymph nodes.  Small
mediastinal lymph nodes are not pathologic by size criteria.

Coronary calcifications are noted.  No pleural fluid.

Abdomen / Pelvis:No abnormal hypermetabolic activity within the
solid organs.  No evidence of abdominal or pelvic hypermetabolic
nodes.  Multiple gallstones noted.  There is a simple cyst within
the left kidney.  Multiple diverticula of the sigmoid colon.
Prostate hypertrophy.

Skeleton:No focal hypermetabolic activity to suggest skeletal
metastasis.
IMPRESSION: 1..  Hypermetabolic left lower lobe pulmonary nodule is most
concerning for bronchogenic carcinoma.  If indeed the lesion is
carcinoma, FDG PET/CT staging T1b N0 M0
2.  Cholelithiasis and diverticulosis.

## 2013-10-10 NOTE — Addendum Note (Signed)
Addended by: Venetia Night on: 10/10/2013 10:15 AM   Modules accepted: Orders

## 2013-10-13 DIAGNOSIS — E119 Type 2 diabetes mellitus without complications: Secondary | ICD-10-CM | POA: Diagnosis not present

## 2013-10-13 DIAGNOSIS — H2510 Age-related nuclear cataract, unspecified eye: Secondary | ICD-10-CM | POA: Diagnosis not present

## 2013-10-13 LAB — HM DIABETES EYE EXAM

## 2013-10-18 ENCOUNTER — Telehealth: Payer: Self-pay | Admitting: Physician Assistant

## 2013-10-18 ENCOUNTER — Emergency Department (HOSPITAL_COMMUNITY)
Admission: EM | Admit: 2013-10-18 | Discharge: 2013-10-18 | Disposition: A | Discharge status: Home / Self Care | Payer: Medicare Other | Attending: Emergency Medicine | Admitting: Emergency Medicine

## 2013-10-18 ENCOUNTER — Encounter (HOSPITAL_COMMUNITY): Payer: Self-pay | Admitting: Emergency Medicine

## 2013-10-18 DIAGNOSIS — Z87891 Personal history of nicotine dependence: Secondary | ICD-10-CM | POA: Insufficient documentation

## 2013-10-18 DIAGNOSIS — E782 Mixed hyperlipidemia: Secondary | ICD-10-CM | POA: Insufficient documentation

## 2013-10-18 DIAGNOSIS — Z87448 Personal history of other diseases of urinary system: Secondary | ICD-10-CM | POA: Insufficient documentation

## 2013-10-18 DIAGNOSIS — Z8719 Personal history of other diseases of the digestive system: Secondary | ICD-10-CM | POA: Diagnosis not present

## 2013-10-18 DIAGNOSIS — K148 Other diseases of tongue: Secondary | ICD-10-CM | POA: Diagnosis present

## 2013-10-18 DIAGNOSIS — X58XXXA Exposure to other specified factors, initial encounter: Secondary | ICD-10-CM | POA: Diagnosis not present

## 2013-10-18 DIAGNOSIS — Z8701 Personal history of pneumonia (recurrent): Secondary | ICD-10-CM | POA: Diagnosis not present

## 2013-10-18 DIAGNOSIS — J449 Chronic obstructive pulmonary disease, unspecified: Secondary | ICD-10-CM | POA: Diagnosis not present

## 2013-10-18 DIAGNOSIS — Z85828 Personal history of other malignant neoplasm of skin: Secondary | ICD-10-CM | POA: Diagnosis not present

## 2013-10-18 DIAGNOSIS — T783XXA Angioneurotic edema, initial encounter: Secondary | ICD-10-CM | POA: Insufficient documentation

## 2013-10-18 DIAGNOSIS — Z8619 Personal history of other infectious and parasitic diseases: Secondary | ICD-10-CM | POA: Diagnosis not present

## 2013-10-18 DIAGNOSIS — Z86010 Personal history of colonic polyps: Secondary | ICD-10-CM | POA: Diagnosis not present

## 2013-10-18 DIAGNOSIS — M1990 Unspecified osteoarthritis, unspecified site: Secondary | ICD-10-CM | POA: Insufficient documentation

## 2013-10-18 DIAGNOSIS — I10 Essential (primary) hypertension: Secondary | ICD-10-CM | POA: Diagnosis not present

## 2013-10-18 DIAGNOSIS — Y929 Unspecified place or not applicable: Secondary | ICD-10-CM | POA: Insufficient documentation

## 2013-10-18 DIAGNOSIS — Z7982 Long term (current) use of aspirin: Secondary | ICD-10-CM | POA: Insufficient documentation

## 2013-10-18 DIAGNOSIS — E119 Type 2 diabetes mellitus without complications: Secondary | ICD-10-CM | POA: Insufficient documentation

## 2013-10-18 DIAGNOSIS — Z79899 Other long term (current) drug therapy: Secondary | ICD-10-CM | POA: Diagnosis not present

## 2013-10-18 DIAGNOSIS — Y9389 Activity, other specified: Secondary | ICD-10-CM | POA: Diagnosis not present

## 2013-10-18 MED ORDER — FAMOTIDINE IN NACL 20-0.9 MG/50ML-% IV SOLN
20.0000 mg | Freq: Once | INTRAVENOUS | Status: AC
  Administered 2013-10-18: 20 mg via INTRAVENOUS
  Filled 2013-10-18: qty 50

## 2013-10-18 MED ORDER — EPINEPHRINE 0.3 MG/0.3ML IJ SOAJ
INTRAMUSCULAR | Status: AC
  Filled 2013-10-18: qty 0.3

## 2013-10-18 MED ORDER — DIPHENHYDRAMINE HCL 50 MG/ML IJ SOLN
25.0000 mg | Freq: Once | INTRAMUSCULAR | Status: AC
  Administered 2013-10-18: 25 mg via INTRAVENOUS

## 2013-10-18 MED ORDER — SODIUM CHLORIDE 0.9 % IV SOLN
Freq: Once | INTRAVENOUS | Status: AC
  Administered 2013-10-18: 15:00:00 via INTRAVENOUS

## 2013-10-18 MED ORDER — DIPHENHYDRAMINE HCL 50 MG/ML IJ SOLN
INTRAMUSCULAR | Status: AC
  Filled 2013-10-18: qty 1

## 2013-10-18 MED ORDER — METHYLPREDNISOLONE SODIUM SUCC 125 MG IJ SOLR
INTRAMUSCULAR | Status: AC
  Filled 2013-10-18: qty 2

## 2013-10-18 MED ORDER — METHYLPREDNISOLONE SODIUM SUCC 125 MG IJ SOLR
125.0000 mg | Freq: Once | INTRAMUSCULAR | Status: AC
  Administered 2013-10-18: 125 mg via INTRAVENOUS

## 2013-10-18 NOTE — ED Provider Notes (Signed)
CSN: [id number]     Arrival date & time 10/18/13  1421 History   First MD Initiated Contact with Patient 10/18/13 1514     Chief Complaint  Patient presents with  . tongue swelling      (Consider location/radiation/quality/duration/timing/severity/associated sxs/prior Treatment) HPI Patient developed swelling of his tongue one hour prior to coming here. He denies difficulty breathing or swallowing or speaking. No voice change. No other associated symptoms. No treatment prior to coming here. Nothing makes symptoms better or worse however he states he slightly worse since getting here Past Medical History  Diagnosis Date  . Mixed hyperlipidemia     takes Pravastatin daily  . Gout     no meds required  . Abnormal prostate exam   . Carotid stenosis   . Cholelithiasis   . Renal insufficiency   . Renal artery stenosis     right  . PAC (premature atrial contraction)   . PVCs (premature ventricular contractions)   . Essential hypertension, benign     takes Clonodine,Losartan,and Metoprolol and HCTZ  . Carotid stenosis right side    Dr.Early does carotid ultrasounds yrly-last one Mar 14-report in epic  . Asthma     as a child  . COPD (chronic obstructive pulmonary disease)     slight   . History of bronchitis   . Pneumonia 02/08/2012  . History of colon polyps   . Enlarged prostate     slightly   . Type II or unspecified type diabetes mellitus without mention of complication, not stated as uncontrolled     takes Metformin daily  . History of shingles   . Skin cancer     Lung Ca- Left Lung  . Arthritis     Gout    Past Surgical History  Procedure Laterality Date  . No past surgeries    . Colonoscopy    . Video assisted thoracoscopy Left 06/16/2012    Procedure: VIDEO ASSISTED THORACOSCOPY;  Surgeon: [name], MD;  Location: [place];  Service: Thoracic;  Laterality: Left;  . Segmentecomy Left 06/16/2012    Procedure: left lower lobe superior SEGMENTECTOMY with node  dissection;  Surgeon: [name], MD;  Location: [place];  Service: Thoracic;  Laterality: Left;  left superior   Family History  Problem Relation Age of Onset  . Heart disease Mother   . Diabetes Mother     AKA  Right   . Hypertension Mother   . Varicose Veins Mother   . Kidney disease Mother     One Kidney  . Cancer Father     Lung   History  Substance Use Topics  . Smoking status: Former Smoker -- 1.50 packs/day for 30 years    Types: Cigarettes    Quit date: 01/16/1976  . Smokeless tobacco: Never Used  . Alcohol Use: No    Review of Systems  Constitutional: Negative.   HENT:       Tongue swelling  Respiratory: Negative.   Cardiovascular: Negative.   Gastrointestinal: Negative.   Musculoskeletal: Negative.   Skin: Negative.   Neurological: Negative.   Psychiatric/Behavioral: Negative.   All other systems reviewed and are negative.     Allergies  Ace inhibitors; Codeine; Indomethacin; and Losartan  Home Medications   Prior to Admission medications   Medication Sig Start Date End Date Taking? Authorizing Provider  aspirin EC 81 MG tablet Take 81 mg by mouth every evening.    Historical Provider, MD  cloNIDine (CATAPRES) 0.2  MG tablet Take 0.2 mg by mouth 2 (two) times daily.    Historical Provider, MD  hydrochlorothiazide (HYDRODIURIL) 25 MG tablet Take 25 mg by mouth daily.    Historical Provider, MD  metFORMIN (GLUCOPHAGE) 500 MG tablet Take 500 mg by mouth 2 (two) times daily.    Historical Provider, MD  metoprolol succinate (TOPROL-XL) 100 MG 24 hr tablet Take 100 mg by mouth daily. Take with or immediately following a meal.    Historical Provider, MD  niacin (NIASPAN) 500 MG CR tablet Take 500 mg by mouth at bedtime.    Historical Provider, MD  pravastatin (PRAVACHOL) 20 MG tablet Take 20 mg by mouth daily.    Historical Provider, MD   BP 196/80  Pulse 93  Temp(Src) 98.6 F (37 C) (Oral)  Resp 17  SpO2 97% Physical Exam  Nursing note and  vitals reviewed. Constitutional: He is oriented to person, place, and time. He appears well-developed and well-nourished. No distress.  Speaks in paragraphs handling secretions well  HENT:  Head: Normocephalic and atraumatic.  Tongue mildly to moderately swollen. Right side greater than left  Eyes: Conjunctivae are normal. Pupils are equal, round, and reactive to light.  Neck: Neck supple. No tracheal deviation present. No thyromegaly present.  Cardiovascular: Normal rate and regular rhythm.   No murmur heard. Pulmonary/Chest: Effort normal and breath sounds normal.  Abdominal: Soft. Bowel sounds are normal. He exhibits no distension. There is no tenderness.  Musculoskeletal: Normal range of motion. He exhibits no edema and no tenderness.  Neurological: He is alert and oriented to person, place, and time. Coordination normal.  Skin: Skin is warm and dry. No rash noted.  Psychiatric: He has a normal mood and affect.    ED Course  Procedures (including critical care time) Labs Review Labs Reviewed - No data to display  Imaging Review No results found.   EKG Interpretation None     5:45 PM tongue much less swollen after treatment with Solu-Medrol Pepcid and Benadryl. Patient in no distress feels ready to go home  MDM  Spoke with Hospital pharmacist. Possible culprits for angioedema include aspirin, pravastatin and niacin. I discussed with 1 assistant at [facility]. We will stop those 3 agents. Patient to followup with Daub tomorrow Final diagnoses:  None   diagnosis angioedema      Orlie Dakin, MD 10/18/13 1752

## 2013-10-18 NOTE — Telephone Encounter (Signed)
Patient is in the ED with angioedema of the tongue. Pharmacy review of his record suggests of his current medications, possibly due to aspirin, pravastatin or niacin.  Patient will be monitored in the ED for several hours and instructed HOLD ASA, NIACIN and PRAVASTATIN until he talks with Dr. Everlene Farrier.  Given patient's age, consider D/C of pravastatin and niacin. Assess risk of carotid disease vs. ASA.

## 2013-10-18 NOTE — Discharge Instructions (Signed)
Angioedema Stop aspirin, pravastatin, and niacin as there is a chance that any of those medications could have caused your reaction today. Call Dr.Daub tomorrow for followup . You may need to be started on different medications. Return immediately if you develop worsening swelling, difficulty breathing, swallowing or feel worse for any reason Angioedema is sudden puffiness (swelling), often of the skin. It can happen:  On your face or privates (genitals).  In your belly (abdomen) or other body parts. It usually happens quickly and gets better in 1 or 2 days. It often starts at night and is found when you wake up. You may get red, itchy patches of skin (hives). Attacks can be dangerous if your breathing passages get puffy. The condition may happen only once, or it can come back at random times. It may happen for several years before it goes away for good. HOME CARE  Only take medicines as told by your doctor.  Always carry your emergency allergy medicines with you.  Wear a medical bracelet as told by your doctor.  Avoid things that you know will cause attacks (triggers). GET HELP IF:  You have another attack.  Your attacks happen more often or get worse.  The condition was passed to you by your parents and you want to have children. GET HELP RIGHT AWAY IF:   Your mouth, tongue, or lips are very puffy.  You have trouble breathing.  You have trouble swallowing.  You pass out (faint). MAKE SURE YOU:   Understand these instructions.  Will watch your condition.  Will get help right away if you are not doing well or get worse. Document Released: 12/20/2008 Document Revised: 10/22/2012 Document Reviewed: 08/25/2012 [facility] Patient Information 2015 [place], [place]. This information is not intended to replace advice given to you by your health care provider. Make sure you discuss any questions you have with your health care provider.

## 2013-10-18 NOTE — ED Notes (Addendum)
Pt has swelling has diminished on tongue and throat. Pt reports feeling much better. Pt is A&O and in NAD. Dr Lenna Sciara has examined pt and sts pt will go home

## 2013-10-18 NOTE — ED Notes (Signed)
Pt has significant swelling to R side of tongue and swelling to throat. Pt also has lower lip swelling. Pt sts that he was eating soup and salad at [employer], has eaten before with no problem. Pt took 25mg  Benadryl PTA

## 2013-10-18 NOTE — ED Notes (Signed)
Pt reports hx of same tongue swelling, 1 month ago. Pt was eating at [employer], started having right sided tongue swelling 1 hour ago. Denies pain. Denies SOB.

## 2013-10-18 NOTE — ED Notes (Signed)
Pt ambulated to BR and back to room w/o assistance and with steady gait. Pt is A&O and in NAD

## 2013-10-18 NOTE — ED Notes (Signed)
Pt ambulated to BR and back to room w/o assistance with steady gait

## 2013-10-19 ENCOUNTER — Telehealth: Payer: Self-pay | Admitting: *Deleted

## 2013-10-19 NOTE — Telephone Encounter (Signed)
I called and talked with Benjamin Patel. He will stay off the pravastatin off aspirin and off niacin. He has recently had allergy testing. He does not want to repeat this.

## 2013-10-19 NOTE — Telephone Encounter (Signed)
I discussed the situation with Benjamin Patel. He had pleural allergy testing one year ago with no abnormalities found. He thinks the source was a spiciness soupy head yesterday. He will continue off of pravastatin aspirin and niacin. Plans to resume his pravastatin in a few weeks.

## 2013-10-19 NOTE — Telephone Encounter (Signed)
In ED this weekend with angioedema- held ASA/Pravastatin and Niacin. He states he is doing much better. He has stopped the above medication.  Need to call pt to find out about allergist- if not make referral. He has been allergy tested and is only allergic to feathers and dust mites. Call to find out who cardiologist is. Dr. Marlou Porch on [place] a call to his office-  Transferred call to Dr. Everlene Farrier  Dr. Everlene Farrier would like to discuss medications.

## 2013-10-19 NOTE — Telephone Encounter (Signed)
Will call and discuss with patient

## 2013-10-20 ENCOUNTER — Telehealth: Payer: Self-pay

## 2013-10-20 NOTE — Telephone Encounter (Signed)
Patient says Dr Everlene Farrier took him off a few

## 2013-10-20 NOTE — Telephone Encounter (Signed)
Pt.notified

## 2013-10-20 NOTE — Telephone Encounter (Signed)
Yes he can continue his pravastatin

## 2013-10-20 NOTE — Telephone Encounter (Signed)
Patient calling to check the status of his Pravastatin request. States that because Dr. Everlene Farrier took him off several medications recently he just wants to make sure he is still supposed to be taking the Pravastatin.    [phone]

## 2013-10-20 NOTE — Telephone Encounter (Signed)
Dr. Everlene Farrier, should he be taking Pravastatin?

## 2013-11-04 ENCOUNTER — Encounter: Payer: Self-pay | Admitting: Emergency Medicine

## 2013-11-29 ENCOUNTER — Other Ambulatory Visit: Payer: Self-pay | Admitting: Emergency Medicine

## 2013-12-18 IMAGING — CR DG CHEST 2V
2 series · 2 of 2 positions shown · non-contrast
Comparison: 07/11/2012.

CLINICAL DATA: Ex-smoker.  Status post surgery for lung cancer 1
month ago.  Tongue swelling today.

CHEST - 2 VIEW

[w chest pa]
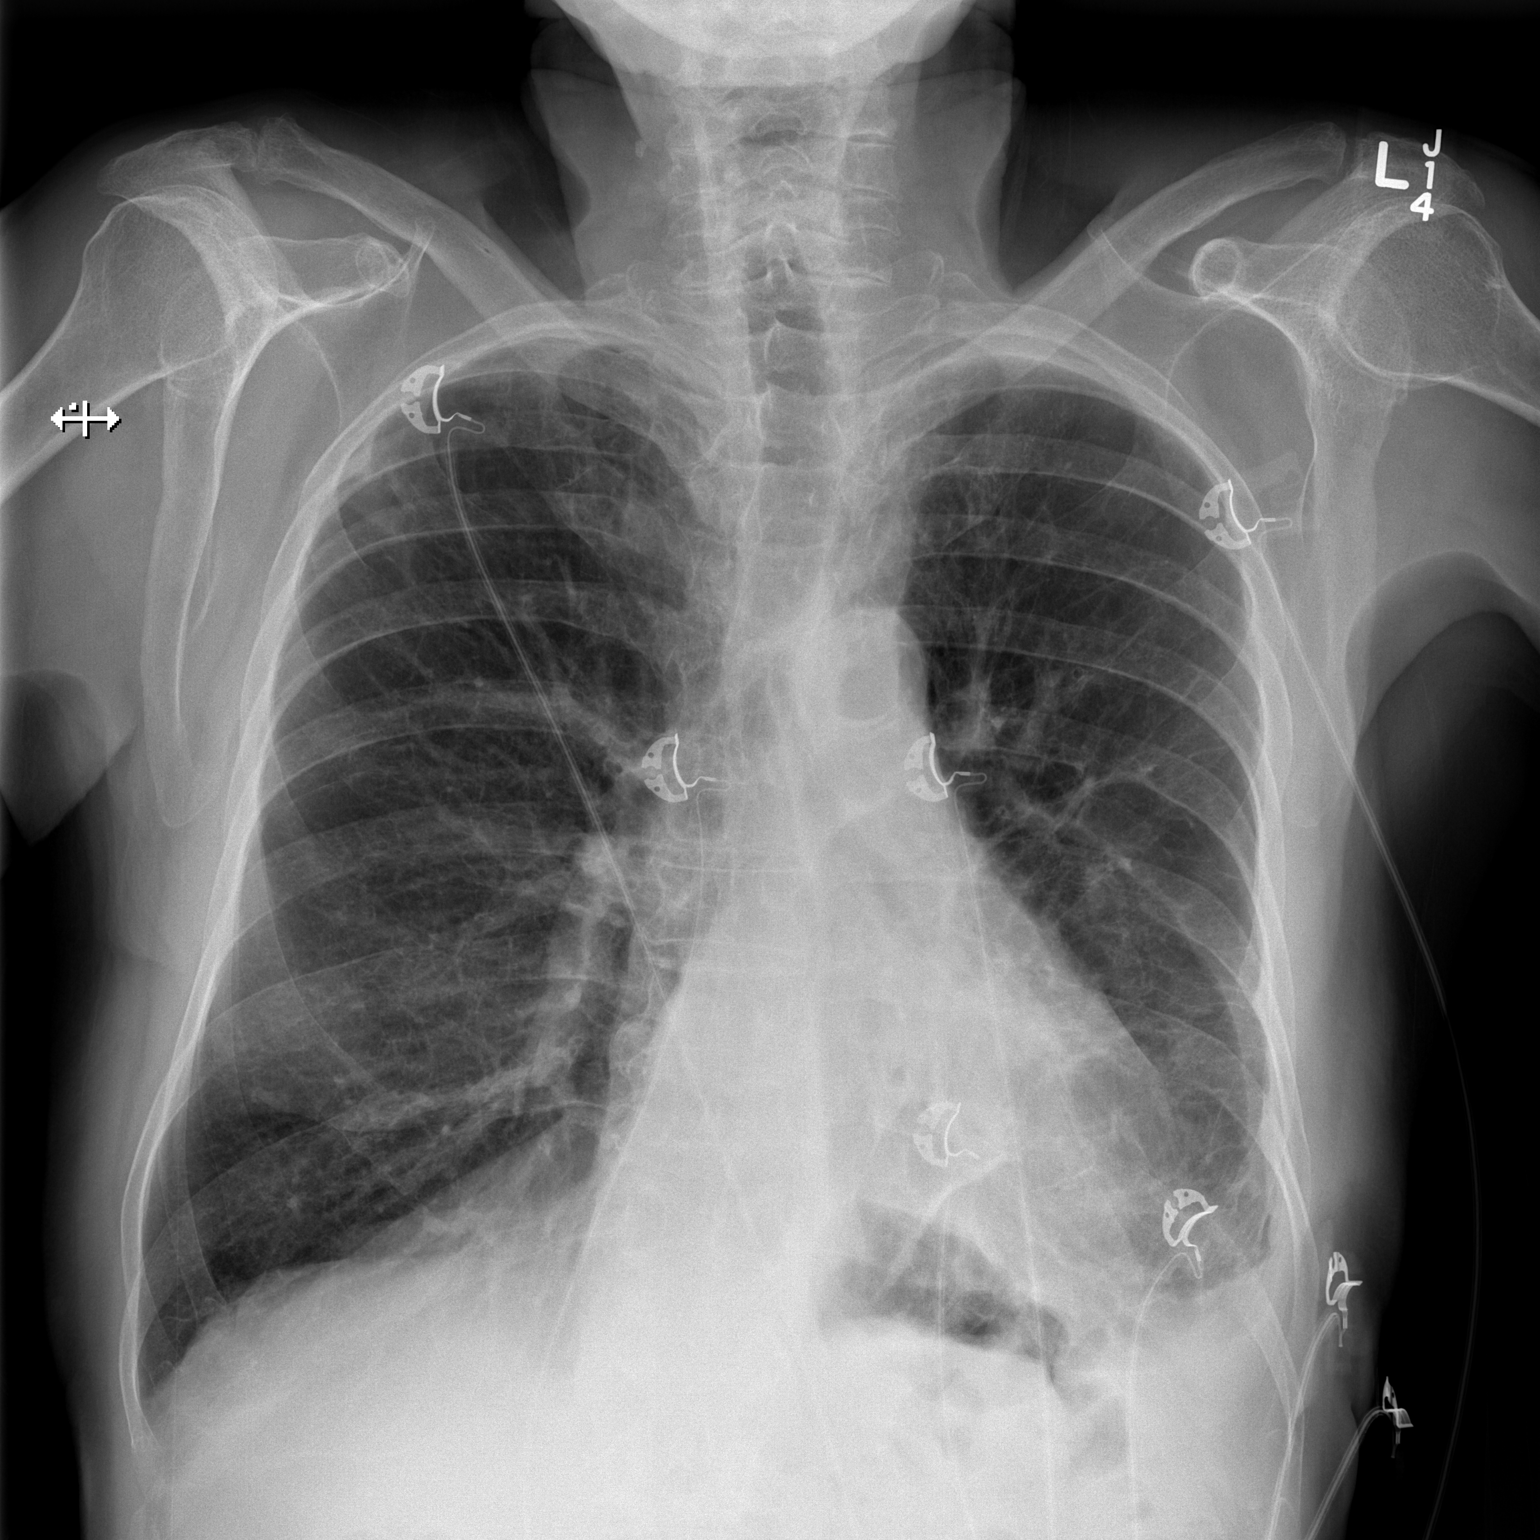

[w chest lat]
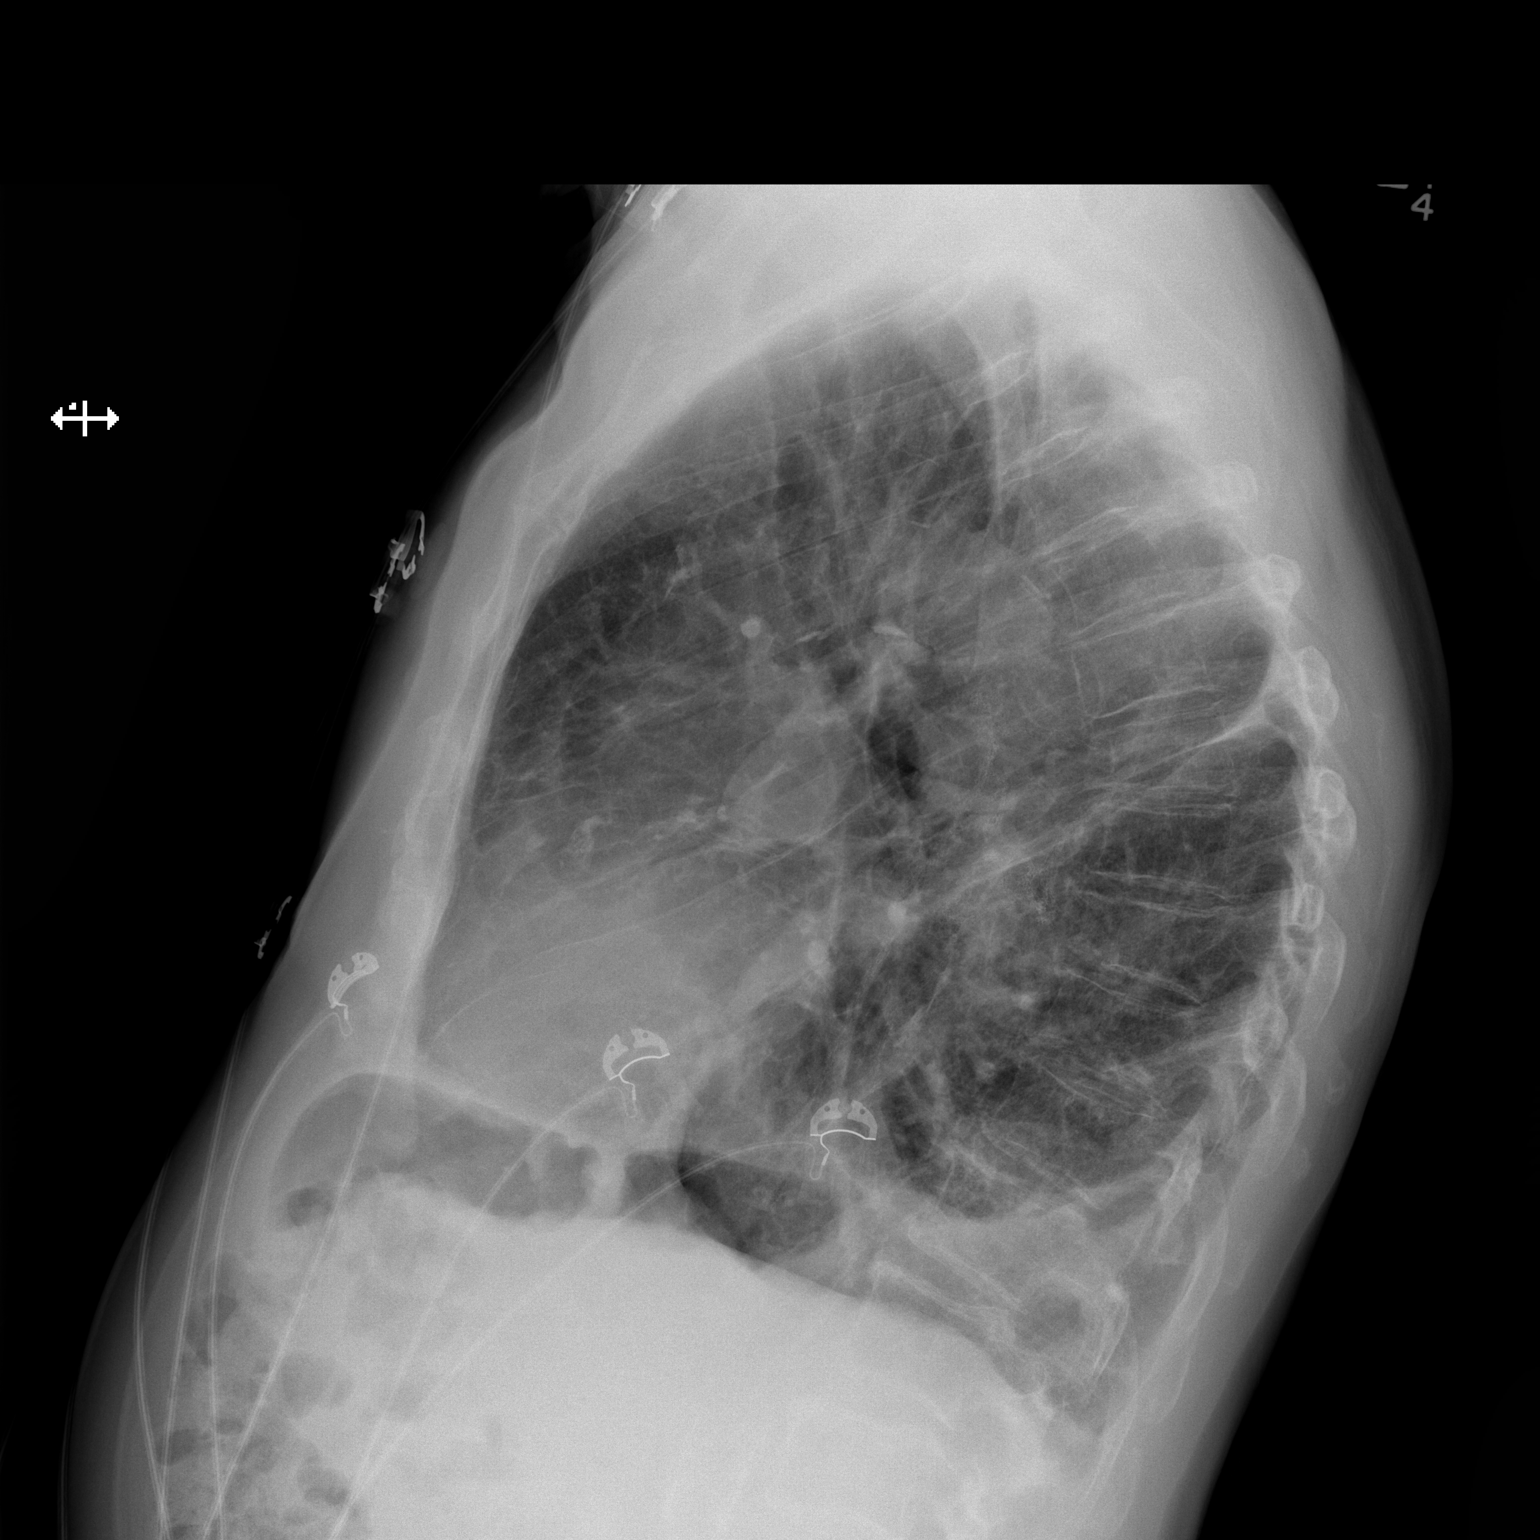

[2 of 2 positions shown; findings below may reference images not displayed]

FINDINGS: The heart remains normal in size.  Stable prominence of
the interstitial markings and left basilar pleural and parenchymal
scarring.  The lungs remain hyperexpanded.  Diffuse osteopenia.  No
significant change in mild thoracic vertebral compression
deformities.
IMPRESSION: 1.  No acute abnormality.
2.  Stable changes of COPD with left basilar scarring.

## 2013-12-25 ENCOUNTER — Other Ambulatory Visit: Payer: Self-pay | Admitting: *Deleted

## 2013-12-25 DIAGNOSIS — C801 Malignant (primary) neoplasm, unspecified: Secondary | ICD-10-CM

## 2013-12-27 ENCOUNTER — Other Ambulatory Visit: Payer: Self-pay | Admitting: Emergency Medicine

## 2014-01-01 ENCOUNTER — Other Ambulatory Visit: Payer: Self-pay

## 2014-01-01 MED ORDER — PRAVASTATIN SODIUM 20 MG PO TABS: 20.0000 mg | ORAL_TABLET | Freq: Every day | ORAL | Status: DC

## 2014-01-01 MED ORDER — METOPROLOL SUCCINATE ER 100 MG PO TB24: ORAL_TABLET | ORAL | Status: DC

## 2014-01-01 MED ORDER — CLONIDINE HCL 0.2 MG PO TABS: ORAL_TABLET | ORAL | Status: DC

## 2014-01-01 MED ORDER — METFORMIN HCL 500 MG PO TABS: 500.0000 mg | ORAL_TABLET | Freq: Two times a day (BID) | ORAL | Status: DC

## 2014-01-01 MED ORDER — HYDROCHLOROTHIAZIDE 25 MG PO TABS: 25.0000 mg | ORAL_TABLET | Freq: Every day | ORAL | Status: DC

## 2014-01-19 ENCOUNTER — Other Ambulatory Visit: Payer: Self-pay | Admitting: Emergency Medicine

## 2014-01-25 ENCOUNTER — Other Ambulatory Visit: Payer: Self-pay | Admitting: Physician Assistant

## 2014-01-28 ENCOUNTER — Ambulatory Visit (INDEPENDENT_AMBULATORY_CARE_PROVIDER_SITE_OTHER): Payer: Medicare Other | Admitting: Emergency Medicine

## 2014-01-28 ENCOUNTER — Encounter: Payer: Self-pay | Admitting: Emergency Medicine

## 2014-01-28 VITALS — BP 134/60 | HR 75 | Temp 97.7°F | Resp 16 | Ht 70.5 in | Wt 165.4 lb

## 2014-01-28 DIAGNOSIS — E119 Type 2 diabetes mellitus without complications: Secondary | ICD-10-CM

## 2014-01-28 DIAGNOSIS — Z125 Encounter for screening for malignant neoplasm of prostate: Secondary | ICD-10-CM | POA: Diagnosis not present

## 2014-01-28 DIAGNOSIS — E7800 Pure hypercholesterolemia, unspecified: Secondary | ICD-10-CM

## 2014-01-28 DIAGNOSIS — Z87440 Personal history of urinary (tract) infections: Secondary | ICD-10-CM | POA: Diagnosis not present

## 2014-01-28 DIAGNOSIS — I10 Essential (primary) hypertension: Secondary | ICD-10-CM

## 2014-01-28 DIAGNOSIS — R350 Frequency of micturition: Secondary | ICD-10-CM

## 2014-01-28 LAB — BASIC METABOLIC PANEL WITH GFR
BUN: 23 mg/dL (ref 6–23)
CHLORIDE: 101 meq/L (ref 96–112)
CO2: 29 mEq/L (ref 19–32)
CREATININE: 1.15 mg/dL (ref 0.50–1.35)
Calcium: 9.8 mg/dL (ref 8.4–10.5)
GFR, EST NON AFRICAN AMERICAN: 58 mL/min — AB
GFR, Est African American: 67 mL/min
Glucose, Bld: 98 mg/dL (ref 70–99)
Potassium: 4.6 mEq/L (ref 3.5–5.3)
Sodium: 139 mEq/L (ref 135–145)

## 2014-01-28 LAB — LIPID PANEL
Cholesterol: 150 mg/dL (ref 0–200)
HDL: 35 mg/dL — ABNORMAL LOW (ref >39)
LDL CALC: 74 mg/dL (ref 0–99)
Total CHOL/HDL Ratio: 4.3 Ratio
Triglycerides: 205 mg/dL — ABNORMAL HIGH (ref <150)
VLDL: 41 mg/dL — ABNORMAL HIGH (ref 0–40)

## 2014-01-28 LAB — POCT GLYCOSYLATED HEMOGLOBIN (HGB A1C): HEMOGLOBIN A1C: 6

## 2014-01-28 MED ORDER — GABAPENTIN 100 MG PO CAPS: 100.0000 mg | ORAL_CAPSULE | Freq: Three times a day (TID) | ORAL | Status: DC

## 2014-01-28 NOTE — Addendum Note (Signed)
Addended by: Yvette Rack on: 01/28/2014 10:28 AM   Modules accepted: Orders

## 2014-01-28 NOTE — Progress Notes (Addendum)
   Subjective:  This chart was scribed for Darlyne Russian, MD by Tamsen Roers, at [facility].  This patient was seen in room 23 and the patient's care was started at 9:19 AM.    Patient ID: Benjamin Patel, male    DOB: [birth year], 81 y.o.   MRN: [id number]  HPI HPI Comments: Benjamin Patel is a 81 y.o. male who presents to [facility] for a follow up.   Patient sees eye doctor once a year. He will be seeing his surgeon next Tuesday and sees his cardiologist every six months. Notes his sugar has been doing fine running around 120-130. Patient has had his flu and pneumonia vaccines.  He has not had another episode of angioedema since his last ER visit. Had a prostate check last time at [facility].  He sees ultrasound every 6 months. He notes he is still having left big toe shooting pains.       Review of Systems  Constitutional: Negative for fatigue and unexpected weight change.  Eyes: Negative for visual disturbance.  Respiratory: Negative for cough, chest tightness and shortness of breath.   Cardiovascular: Negative for chest pain, palpitations and leg swelling.  Gastrointestinal: Negative for abdominal pain and blood in stool.  Musculoskeletal: Positive for myalgias (big toe pain on his left foot).  Neurological: Negative for dizziness, light-headedness and headaches.       Objective:   Physical Exam   CONSTITUTIONAL: Well developed/well nourished HEAD: Normocephalic/atraumatic EYES: EOMI/PERRL ENMT: Mucous membranes moist NECK:scar from previous carotid surgery  SPINE/BACK:entire spine nontender CV: S1/S2 noted, no murmurs/rubs/gallops noted- previous bypass surgery  LUNGS: Lungs are clear to auscultation bilaterally, no apparent distress ABDOMEN: soft, nontender, no rebound or guarding, bowel sounds noted throughout abdomen GU:no cva tenderness NEURO: Pt is awake/alert/appropriate, moves all extremitiesx4.  No  facial droop.   EXTREMITIES:decreases sensation over the toe consistent with diabetic neuropathy.  SKIN: warm, color normal PSYCH: no abnormalities of mood noted, alert and oriented to situation   Filed Vitals:   01/28/14 0907  BP: 134/60  Pulse: 75  Temp: 97.7 F (36.5 C)  TempSrc: Oral  Resp: 16  Height: 5' 10.5" (1.791 m)  Weight: 165 lb 6.4 oz (75.025 kg)  SpO2: 96%   Results for orders placed or performed in visit on 01/28/14  POCT glycosylated hemoglobin (Hb A1C)  Result Value Ref Range   Hemoglobin A1C 6.0        Assessment & Plan:  Decrease his metformin to once a day. He may eventually be able to get off of this. Routine blood work was done today.I personally performed the services described in this documentation, which was scribed in my presence. The recorded information has been reviewed and is accurate. I'm going to try him on Neurontin for his toe pain and see how he does.

## 2014-01-29 LAB — PSA, MEDICARE: PSA: 1.8 ng/mL (ref <=4.00)

## 2014-02-02 ENCOUNTER — Ambulatory Visit (INDEPENDENT_AMBULATORY_CARE_PROVIDER_SITE_OTHER): Payer: Medicare Other | Admitting: Thoracic Surgery (Cardiothoracic Vascular Surgery)

## 2014-02-02 ENCOUNTER — Ambulatory Visit
Admission: RE | Admit: 2014-02-02 | Discharge: 2014-02-02 | Disposition: A | Discharge status: Home / Self Care | Payer: Medicare Other | Source: Ambulatory Visit | Attending: Thoracic Surgery (Cardiothoracic Vascular Surgery) | Admitting: Thoracic Surgery (Cardiothoracic Vascular Surgery)

## 2014-02-02 ENCOUNTER — Encounter: Payer: Self-pay | Admitting: Thoracic Surgery (Cardiothoracic Vascular Surgery)

## 2014-02-02 DIAGNOSIS — K8020 Calculus of gallbladder without cholecystitis without obstruction: Secondary | ICD-10-CM | POA: Diagnosis not present

## 2014-02-02 DIAGNOSIS — I2510 Atherosclerotic heart disease of native coronary artery without angina pectoris: Secondary | ICD-10-CM | POA: Diagnosis not present

## 2014-02-02 DIAGNOSIS — J439 Emphysema, unspecified: Secondary | ICD-10-CM | POA: Diagnosis not present

## 2014-02-02 DIAGNOSIS — C3432 Malignant neoplasm of lower lobe, left bronchus or lung: Secondary | ICD-10-CM

## 2014-02-02 DIAGNOSIS — C801 Malignant (primary) neoplasm, unspecified: Secondary | ICD-10-CM

## 2014-02-02 DIAGNOSIS — R918 Other nonspecific abnormal finding of lung field: Secondary | ICD-10-CM | POA: Insufficient documentation

## 2014-02-02 NOTE — Progress Notes (Signed)
HPI:  [name] returns for a scheduled 18 month follow-up visit.  He has an 81 year old gentleman who had a left lower lobe superior segmentectomy in June 2014 for stage IA non-small cell carcinoma. He was last in the office in July 2015. He had no evidence recurrent disease at 1 year.  Since then he says that he's been doing well. His wife says that he does minimize symptoms. He denies any chest pain. He does get short of breath with walking. He claims that he can walk half a mile, his wife disputes that. He denies any wheezing. He thinks his weight is stable. His wife says that he does not exercise on a regular basis. Has not had any unusual headaches or visual changes.  Past Medical History  Diagnosis Date  . Mixed hyperlipidemia     takes Pravastatin daily  . Gout     no meds required  . Abnormal prostate exam   . Carotid stenosis   . Cholelithiasis   . Renal insufficiency   . Renal artery stenosis     right  . PAC (premature atrial contraction)   . PVCs (premature ventricular contractions)   . Essential hypertension, benign     takes Clonodine,Losartan,and Metoprolol and HCTZ  . Carotid stenosis right side    Dr.Early does carotid ultrasounds yrly-last one Mar 14-report in epic  . Asthma     as a child  . COPD (chronic obstructive pulmonary disease)     slight   . History of bronchitis   . Pneumonia 02/08/2012  . History of colon polyps   . Enlarged prostate     slightly   . Type II or unspecified type diabetes mellitus without mention of complication, not stated as uncontrolled     takes Metformin daily  . History of shingles   . Skin cancer     Lung Ca- Left Lung  . Arthritis     Gout       Current Outpatient Prescriptions  Medication Sig Dispense Refill  . cloNIDine (CATAPRES) 0.2 MG tablet TAKE 1 TABLET BY MOUTH TWICE A DAY.  NEEDS OFFICE VISIT FURTHER REFILLS 60 tablet 0  . colchicine 0.6 MG tablet Take 0.6 mg by mouth daily as needed (for gout).    Marland Kitchen  gabapentin (NEURONTIN) 100 MG capsule Take 1 capsule (100 mg total) by mouth 3 (three) times daily. 90 capsule 3  . hydrochlorothiazide (HYDRODIURIL) 25 MG tablet Take 25 mg by mouth every morning.     . metFORMIN (GLUCOPHAGE) 500 MG tablet Take 500 mg by mouth daily with breakfast.     . metoprolol succinate (TOPROL-XL) 100 MG 24 hr tablet Take 100 mg by mouth at bedtime. Take with or immediately following a meal.    . pravastatin (PRAVACHOL) 20 MG tablet TAKE 1 TABLET BY MOUTH EVERY DAY 30 tablet 3   No current facility-administered medications for this visit.    Physical Exam BP 153/71 mmHg  Pulse 62  Resp 16  Ht 5' 10.5" (1.791 m)  Wt 165 lb (74.844 kg)  BMI 23.33 kg/m2  SpO70 69% 81 year old man in no acute distress Alert and oriented 3 with no focal neurologic deficits Severe kyphoscoliosis No cervical or supraclavicular adenopathy Increased AP diameter of chest. Diminished breath sounds bilaterally, no wheezing Cardiac regular rate and rhythm normal S1 and S2  Diagnostic Tests: CT CHEST WITHOUT CONTRAST  TECHNIQUE: Multidetector CT imaging of the chest was performed following the standard protocol without IV contrast.  COMPARISON:  08/04/2013  FINDINGS: Mediastinum/Nodes: Aortic, coronary, and branch vessel atherosclerotic calcification. No pathologic thoracic adenopathy is observed on today's noncontrast CT.  Lungs/Pleura: Severe emphysema. Left lower lobe wedge resection. No recurrent malignancy identified.  Upper abdomen: Cholelithiasis. Left nephrolithiasis.  Musculoskeletal: Thoracic spondylosis and mild thoracic kyphosis.  IMPRESSION: 1. No recurrent malignancy identified. Wedge resection in the left lower lobe. 2. Severe emphysema. 3. Considerable atherosclerosis. 4. Cholelithiasis. 5. Left nephrolithiasis. 6. Thoracic spondylosis and mild kyphosis.   Electronically Signed  By: Benjamin Patel M.D.  On: 02/02/2014  13:13  Impression: 81 year old man who is now 18 months out from a superior segmentectomy for stage Ia non-small cell carcinoma of the left lower lobe. He has no evidence recurrent disease.  I reviewed his CT scan. He does have severe emphysema as well as postoperative changes, but no evidence of recurrence.  I encouraged him to try to exercise on a regular basis. He has significant COPD and will have limitations no matter what. But emphasized that if he does exercise on a regular basis that should help to some degree.  Plan: Return in 6 months with CT of chest.  I spent greater than 15 minutes face-to-face.  Benjamin Patel M.D.

## 2014-03-29 ENCOUNTER — Other Ambulatory Visit: Payer: Self-pay | Admitting: Emergency Medicine

## 2014-04-01 ENCOUNTER — Other Ambulatory Visit: Payer: Self-pay | Admitting: Emergency Medicine

## 2014-04-02 NOTE — Telephone Encounter (Signed)
Corrected sig on metformin to read 1 tab QD instead of BID - reduced dose at 01/28/14 OV

## 2014-04-20 ENCOUNTER — Other Ambulatory Visit: Payer: Self-pay | Admitting: Emergency Medicine

## 2014-04-23 ENCOUNTER — Other Ambulatory Visit: Payer: Self-pay | Admitting: Emergency Medicine

## 2014-05-04 ENCOUNTER — Ambulatory Visit (INDEPENDENT_AMBULATORY_CARE_PROVIDER_SITE_OTHER): Payer: Medicare Other | Admitting: Emergency Medicine

## 2014-05-04 ENCOUNTER — Encounter: Payer: Self-pay | Admitting: Emergency Medicine

## 2014-05-04 VITALS — BP 110/65 | HR 76 | Temp 97.3°F | Resp 16 | Ht 67.5 in | Wt 169.6 lb

## 2014-05-04 DIAGNOSIS — E1141 Type 2 diabetes mellitus with diabetic mononeuropathy: Secondary | ICD-10-CM

## 2014-05-04 LAB — POCT GLYCOSYLATED HEMOGLOBIN (HGB A1C): Hemoglobin A1C: 6

## 2014-05-04 LAB — GLUCOSE, POCT (MANUAL RESULT ENTRY): POC Glucose: 138 mg/dl — AB (ref 70–99)

## 2014-05-04 MED ORDER — GABAPENTIN 100 MG PO CAPS: ORAL_CAPSULE | ORAL | Status: DC

## 2014-05-04 NOTE — Progress Notes (Signed)
Subjective:  This chart was scribed for Darlyne Russian, MD by Tamsen Roers, at [facility].  This patient was seen in room 21 and the patient's care was started at 8:16 AM.    Patient ID: Benjamin Patel, male    DOB: 19-Apr-1927, 81 y.o.   MRN: [id number]  Chief Complaint  Patient presents with  . Diabetes  . Hypertension     HPI  HPI Comments: Benjamin Patel is a 81 y.o. male  With a history of diabetes and hypertension who presents to [facility]  for a follow up.  He is currently not taking any insulin.Patients notes that his sugar has been staying between 120-140 but has shot up a couple of times.   He is taking 100 mg of Gabapentin.  He was not seeing any changes with the medication for the first two weeks and is feeling discomfort mostly in his left big toe.  He would like to try 200 mg of Gabapentin instead.  He notes that he was looking for shoes for more comfort but needs a prescription. He sees the opthalmologist once a year but has not seen a dermatologist recently.   Patient denies any chest pain or belly pain.   Patient just had an appointment with his cardiologist and is 2 years post surgery. He has no other complaints today.       Patient Active Problem List   Diagnosis Date Noted  . Lung mass 02/02/2014  . Type II or unspecified type diabetes mellitus without mention of complication, not stated as uncontrolled 02/10/2013  . Lung cancer 02/10/2013  . Carotid arterial disease 02/10/2013  . Coronary artery calcification 12/22/2012  . Atrial fibrillation 06/23/2012  . CKD (chronic kidney disease) stage 2, GFR 60-89 ml/min 06/19/2012  . COPD/ GOLD III 06/17/2012  . DM (diabetes mellitus) type II controlled with renal manifestation 06/17/2012  . Occlusion and stenosis of carotid artery without mention of cerebral infarction 03/20/2012  . Non-small cell carcinoma of lung, stage 1 02/10/2012  . Essential hypertension, benign   . Mixed  hyperlipidemia   . Gout   . DM (diabetes mellitus), type 2 with peripheral vascular complications   . Carotid stenosis   . Cholelithiasis   . Renal insufficiency   . Renal artery stenosis    Past Medical History  Diagnosis Date  . Mixed hyperlipidemia     takes Pravastatin daily  . Gout     no meds required  . Abnormal prostate exam   . Carotid stenosis   . Cholelithiasis   . Renal insufficiency   . Renal artery stenosis     right  . PAC (premature atrial contraction)   . PVCs (premature ventricular contractions)   . Essential hypertension, benign     takes Clonodine,Losartan,and Metoprolol and HCTZ  . Carotid stenosis right side    Dr.Early does carotid ultrasounds yrly-last one Mar 14-report in epic  . Asthma     as a child  . COPD (chronic obstructive pulmonary disease)     slight   . History of bronchitis   . Pneumonia 02/08/2012  . History of colon polyps   . Enlarged prostate     slightly   . Type II or unspecified type diabetes mellitus without mention of complication, not stated as uncontrolled     takes Metformin daily  . History of shingles   . Skin cancer     Lung Ca- Left Lung  . Arthritis  Gout    Past Surgical History  Procedure Laterality Date  . No past surgeries    . Colonoscopy    . Video assisted thoracoscopy Left 06/16/2012    Procedure: VIDEO ASSISTED THORACOSCOPY;  Surgeon: [name], MD;  Location: [place];  Service: Thoracic;  Laterality: Left;  . Segmentecomy Left 06/16/2012    Procedure: left lower lobe superior SEGMENTECTOMY with node dissection;  Surgeon: [name], MD;  Location: [place];  Service: Thoracic;  Laterality: Left;  left superior   Allergies  Allergen Reactions  . Ace Inhibitors Cough  . Codeine Other (See Comments)    GI Upset  . Indomethacin Other (See Comments)    Elevated BP  . Losartan Swelling    ANGIOEDEMA/    Prior to Admission medications   Medication Sig Start Date End Date Taking?  Authorizing Provider  cloNIDine (CATAPRES) 0.2 MG tablet Take 1 tablet (0.2 mg total) by mouth 2 (two) times daily. 03/30/14   Darlyne Russian, MD  gabapentin (NEURONTIN) 100 MG capsule Take 1 capsule (100 mg total) by mouth 3 (three) times daily. 01/28/14   Darlyne Russian, MD  hydrochlorothiazide (HYDRODIURIL) 25 MG tablet Take 25 mg by mouth every morning.     Historical Provider, MD  hydrochlorothiazide (HYDRODIURIL) 25 MG tablet TAKE ONE TABLET BY MOUTH ONCE DAILY 04/21/14   Darlyne Russian, MD  metFORMIN (GLUCOPHAGE) 500 MG tablet Take 500 mg by mouth daily with breakfast.     Historical Provider, MD  metFORMIN (GLUCOPHAGE) 500 MG tablet Take 1 tablet (500 mg total) by mouth daily. 04/02/14   Darlyne Russian, MD  metoprolol succinate (TOPROL-XL) 100 MG 24 hr tablet Take 100 mg by mouth at bedtime. Take with or immediately following a meal.    Historical Provider, MD  metoprolol succinate (TOPROL-XL) 100 MG 24 hr tablet Take 1 tablet (100 mg total) by mouth daily. 03/30/14   Darlyne Russian, MD  pravastatin (PRAVACHOL) 20 MG tablet TAKE ONE TABLET BY MOUTH AT BEDTIME. 04/23/14   Fara Chute, PA-C   History   Social History  . Marital Status: Married    Spouse Name: N/A  . Number of Children: 3  . Years of Education: N/A   Occupational History  . retired     Herbalist   Social History Main Topics  . Smoking status: Former Smoker -- 1.50 packs/day for 30 years    Types: Cigarettes    Quit date: 01/16/1976  . Smokeless tobacco: Never Used  . Alcohol Use: No  . Drug Use: No  . Sexual Activity: Not on file   Other Topics Concern  . Not on file   Social History Narrative        Review of Systems  Constitutional: Negative for fever and chills.  Cardiovascular: Negative for chest pain.  Gastrointestinal: Negative for abdominal pain.       Objective:   Physical Exam CONSTITUTIONAL: Well developed/well nourished HEAD: Normocephalic/atraumatic EYES: EOMI/PERRL ENMT: Mucous  membranes moist NECK: supple no meningeal signs SPINE/BACK:entire spine nontender CV:  He has trace carotid bruits LUNGS: Lungs are clear to auscultation bilaterally, no apparent distress ABDOMEN: soft, nontender, no rebound or guarding, bowel sounds noted throughout abdomen GU:no cva tenderness NEURO: Pt is awake/alert/appropriate, moves all extremitiesx4.  No facial droop.   EXTREMITIES: pulses normal/equal, full ROMLeft  great toe: decreased sensation.  SKIN: warm, color normal PSYCH: no abnormalities of mood noted, alert and oriented to situation  Filed Vitals:   05/04/14 0809  BP: 110/65  Pulse: 76  Temp: 97.3 F (36.3 C)  TempSrc: Oral  Resp: 16  Height: 5' 7.5" (1.715 m)  Weight: 169 lb 9.6 oz (76.93 kg)  SpO2: 96%   Results for orders placed or performed in visit on 05/04/14  POCT glucose (manual entry)  Result Value Ref Range   POC Glucose 138 (A) 70 - 99 mg/dl  POCT glycosylated hemoglobin (Hb A1C)  Result Value Ref Range   Hemoglobin A1C 6.0          Assessment & Plan:  Diabetes is doing great. His hemoglobin A1c is normal. He can stop his metformin. I increased his Neurontin to 200 mg up to 3 times a day and gave him a prescription for diabetic shoes.I personally performed the services described in this documentation, which was scribed in my presence. The recorded information has been reviewed and is accurate.

## 2014-05-04 NOTE — Patient Instructions (Signed)
Please stop your metformin. Please increase your Neurontin to 2 capsules 3 times a day as needed

## 2014-05-11 ENCOUNTER — Ambulatory Visit (INDEPENDENT_AMBULATORY_CARE_PROVIDER_SITE_OTHER): Payer: Medicare Other

## 2014-05-11 ENCOUNTER — Ambulatory Visit (INDEPENDENT_AMBULATORY_CARE_PROVIDER_SITE_OTHER): Payer: Medicare Other | Admitting: Family Medicine

## 2014-05-11 VITALS — BP 134/68 | HR 74 | Temp 97.7°F | Resp 17 | Ht 68.0 in | Wt 173.0 lb

## 2014-05-11 DIAGNOSIS — J439 Emphysema, unspecified: Secondary | ICD-10-CM

## 2014-05-11 DIAGNOSIS — R062 Wheezing: Secondary | ICD-10-CM

## 2014-05-11 DIAGNOSIS — R059 Cough, unspecified: Secondary | ICD-10-CM

## 2014-05-11 DIAGNOSIS — K219 Gastro-esophageal reflux disease without esophagitis: Secondary | ICD-10-CM

## 2014-05-11 DIAGNOSIS — R05 Cough: Secondary | ICD-10-CM

## 2014-05-11 DIAGNOSIS — Z85118 Personal history of other malignant neoplasm of bronchus and lung: Secondary | ICD-10-CM | POA: Diagnosis not present

## 2014-05-11 DIAGNOSIS — E119 Type 2 diabetes mellitus without complications: Secondary | ICD-10-CM

## 2014-05-11 LAB — POCT CBC
GRANULOCYTE PERCENT: 70.8 % (ref 37–80)
HEMATOCRIT: 46.3 % (ref 43.5–53.7)
HEMOGLOBIN: 14.8 g/dL (ref 14.1–18.1)
Lymph, poc: 2.3 (ref 0.6–3.4)
MCH: 29.1 pg (ref 27–31.2)
MCHC: 31.9 g/dL (ref 31.8–35.4)
MCV: 91 fL (ref 80–97)
MID (CBC): 0.7 (ref 0–0.9)
MPV: 7.6 fL (ref 0–99.8)
POC Granulocyte: 7.3 — AB (ref 2–6.9)
POC LYMPH PERCENT: 22.2 %L (ref 10–50)
POC MID %: 7 % (ref 0–12)
Platelet Count, POC: 257 10*3/uL (ref 142–424)
RBC: 5.09 M/uL (ref 4.69–6.13)
RDW, POC: 13.6 %
WBC: 10.3 10*3/uL — AB (ref 4.6–10.2)

## 2014-05-11 LAB — GLUCOSE, POCT (MANUAL RESULT ENTRY): POC GLUCOSE: 80 mg/dL (ref 70–99)

## 2014-05-11 MED ORDER — PREDNISONE 20 MG PO TABS: ORAL_TABLET | ORAL | Status: DC

## 2014-05-11 MED ORDER — ALBUTEROL SULFATE (2.5 MG/3ML) 0.083% IN NEBU
2.5000 mg | INHALATION_SOLUTION | Freq: Once | RESPIRATORY_TRACT | Status: AC
  Administered 2014-05-11: 2.5 mg via RESPIRATORY_TRACT

## 2014-05-11 MED ORDER — ALBUTEROL SULFATE HFA 108 (90 BASE) MCG/ACT IN AERS: 2.0000 | INHALATION_SPRAY | Freq: Four times a day (QID) | RESPIRATORY_TRACT | Status: DC | PRN

## 2014-05-11 MED ORDER — OMEPRAZOLE 40 MG PO CPDR: 40.0000 mg | DELAYED_RELEASE_CAPSULE | Freq: Every day | ORAL | Status: DC

## 2014-05-11 MED ORDER — METHYLPREDNISOLONE SODIUM SUCC 125 MG IJ SOLR
62.0000 mg | Freq: Once | INTRAMUSCULAR | Status: AC
  Administered 2014-05-11: 62 mg via INTRAMUSCULAR

## 2014-05-11 NOTE — Progress Notes (Signed)
Subjective: Patient awakened during the night last night with a bad episode of reflux. He never has reflux, but he tasted acid coming up into his mouth. He was congested and coughing. Today he has had some wheezing. He has a history of emphysema. He had lung cancer moved last year on the left side. He does not smoke. He used to smoke but quit many years ago.  Objective: Throat clear. Neck supple without nodes. Chest has soft wheezing bilaterally. Heart regular without murmurs. Peak flow was only 110 with a predicted of probably about 450. He was given hand-held nebulizer and increased to 150 at the best. His O2 saturation was satisfactory.   Results for orders placed or performed in visit on 05/11/14  POCT CBC  Result Value Ref Range   WBC 10.3 (A) 4.6 - 10.2 K/uL   Lymph, poc 2.3 0.6 - 3.4   POC LYMPH PERCENT 22.2 10 - 50 %L   MID (cbc) 0.7 0 - 0.9   POC MID % 7.0 0 - 12 %M   POC Granulocyte 7.3 (A) 2 - 6.9   Granulocyte percent 70.8 37 - 80 %G   RBC 5.09 4.69 - 6.13 M/uL   Hemoglobin 14.8 14.1 - 18.1 g/dL   HCT, POC 46.3 43.5 - 53.7 %   MCV 91.0 80 - 97 fL   MCH, POC 29.1 27 - 31.2 pg   MCHC 31.9 31.8 - 35.4 g/dL   RDW, POC 13.6 %   Platelet Count, POC 257 142 - 424 K/uL   MPV 7.6 0 - 99.8 fL  POCT glucose (manual entry)  Result Value Ref Range   POC Glucose 80 70 - 99 mg/dl   [facility] reading (PRIMARY) by  Dr. Linna Darner No active infiltrate in patient with emphysema. Unusual area right lower lung, probably from his lobectomy or emphysema  Assessment: Wheezing and cough secondary to reflux and probable aspiration History of diabetes, diet controlled  Plan: See instructions Monitor his sugars at home Return if not improving Will treat with prednisone and omeprazole and albuterol. He is to return if in all worse or go to the emergency room if breathing difficulties  .

## 2014-05-11 NOTE — Patient Instructions (Addendum)
Follow-up with Dr. Everlene Farrier as needed  Come in at anytime and see someone if you are having more trouble breathing, or if it is bad going to the emergency room  Take omeprazole (for Prilosec) 40 mg each evening  Take the prednisone 60 mg daily for 2 days, 40 for 2 days, 20 for 2 days starting tomorrow morning. You will have the 20 mg pills.  Monitor your blood sugars and take your metformin for a few days while on the prednisone if your sugars are running over 150  Use the albuterol inhaler 2 inhalations every 4 hours for wheezing

## 2014-06-20 ENCOUNTER — Other Ambulatory Visit: Payer: Self-pay | Admitting: Emergency Medicine

## 2014-07-09 ENCOUNTER — Other Ambulatory Visit: Payer: Self-pay | Admitting: Thoracic Surgery (Cardiothoracic Vascular Surgery)

## 2014-07-09 DIAGNOSIS — C3490 Malignant neoplasm of unspecified part of unspecified bronchus or lung: Secondary | ICD-10-CM

## 2014-07-30 ENCOUNTER — Other Ambulatory Visit: Payer: Self-pay | Admitting: Emergency Medicine

## 2014-08-10 ENCOUNTER — Encounter: Payer: Self-pay | Admitting: Thoracic Surgery (Cardiothoracic Vascular Surgery)

## 2014-08-10 ENCOUNTER — Ambulatory Visit
Admission: RE | Admit: 2014-08-10 | Discharge: 2014-08-10 | Disposition: A | Discharge status: Home / Self Care | Payer: Medicare Other | Source: Ambulatory Visit | Attending: Thoracic Surgery (Cardiothoracic Vascular Surgery) | Admitting: Thoracic Surgery (Cardiothoracic Vascular Surgery)

## 2014-08-10 ENCOUNTER — Ambulatory Visit (INDEPENDENT_AMBULATORY_CARE_PROVIDER_SITE_OTHER): Payer: Medicare Other | Admitting: Thoracic Surgery (Cardiothoracic Vascular Surgery)

## 2014-08-10 VITALS — BP 145/71 | HR 66 | Resp 16 | Ht 68.0 in | Wt 177.0 lb

## 2014-08-10 DIAGNOSIS — J432 Centrilobular emphysema: Secondary | ICD-10-CM | POA: Diagnosis not present

## 2014-08-10 DIAGNOSIS — C3432 Malignant neoplasm of lower lobe, left bronchus or lung: Secondary | ICD-10-CM | POA: Diagnosis not present

## 2014-08-10 DIAGNOSIS — C3490 Malignant neoplasm of unspecified part of unspecified bronchus or lung: Secondary | ICD-10-CM

## 2014-08-10 NOTE — Progress Notes (Signed)
[address]       [place] [address]             [phone]       HPI:  Mr. Canepa returns for a scheduled follow-up visit.  He is an 81 year old man who had a left lower lobe superior segmentectomy in June 2014 for stage IA non-small cell carcinoma. I last saw him in the office in January of this year at which time he was doing well with no evidence recurrent disease.  He feels well. He has had some difficulty breathing with the recent heat and humidity. His weight is stable and his appetite is good. He has not had any headaches or visual changes. He denies any new or unusual bone or joint pain.  He quit smoking in 1978.  Past Medical History  Diagnosis Date  . Mixed hyperlipidemia     takes Pravastatin daily  . Gout     no meds required  . Abnormal prostate exam   . Carotid stenosis   . Cholelithiasis   . Renal insufficiency   . Renal artery stenosis     right  . PAC (premature atrial contraction)   . PVCs (premature ventricular contractions)   . Essential hypertension, benign     takes Clonodine,Losartan,and Metoprolol and HCTZ  . Carotid stenosis right side    Dr.Early does carotid ultrasounds yrly-last one Mar 14-report in epic  . Asthma     as a child  . COPD (chronic obstructive pulmonary disease)     slight   . History of bronchitis   . Pneumonia 02/08/2012  . History of colon polyps   . Enlarged prostate     slightly   . Type II or unspecified type diabetes mellitus without mention of complication, not stated as uncontrolled     takes Metformin daily  . History of shingles   . Skin cancer     Lung Ca- Left Lung  . Arthritis     Gout    Past Surgical History  Procedure Laterality Date  . No past surgeries    . Colonoscopy    . Video assisted thoracoscopy Left 06/16/2012    Procedure: VIDEO ASSISTED THORACOSCOPY;  Surgeon: [name], MD;  Location: [place];  Service: Thoracic;  Laterality: Left;  . Segmentecomy Left  06/16/2012    Procedure: left lower lobe superior SEGMENTECTOMY with node dissection;  Surgeon: [name], MD;  Location: [place];  Service: Thoracic;  Laterality: Left;  left superior      Current Outpatient Prescriptions  Medication Sig Dispense Refill  . cloNIDine (CATAPRES) 0.2 MG tablet TAKE ONE TABLET BY MOUTH TWICE DAILY 60 tablet 2  . gabapentin (NEURONTIN) 100 MG capsule Take 2 capsules up to 3 times a day 180 capsule 3  . hydrochlorothiazide (HYDRODIURIL) 25 MG tablet TAKE ONE TABLET BY MOUTH ONCE DAILY 30 tablet 3  . metoprolol succinate (TOPROL-XL) 100 MG 24 hr tablet TAKE ONE TABLET BY MOUTH ONCE DAILY 30 tablet 2  . omeprazole (PRILOSEC) 40 MG capsule Take 1 capsule (40 mg total) by mouth daily. 30 capsule 0  . pravastatin (PRAVACHOL) 20 MG tablet TAKE ONE TABLET BY MOUTH AT BEDTIME. 30 tablet 0  . pravastatin (PRAVACHOL) 20 MG tablet TAKE ONE TABLET BY MOUTH AT BEDTIME 30 tablet 1   No current facility-administered medications for this visit.    Physical Exam BP 145/71 mmHg  Pulse 66  Resp 16  Ht '5\' 8"'$  (1.727 m)  Wt 177 lb (80.287 kg)  BMI 26.92 kg/m2  SpO2 95% Elderly man in no acute distress Well-developed and well-nourished Alert and oriented 3 with no focal neurologic deficit No cervical or subclavicular adenopathy Cardiac regular rate and rhythm Diminished breath sounds bilaterally, no wheezing No cervical or supraclavicular adenopathy  Diagnostic Tests: CT CHEST WITHOUT CONTRAST  TECHNIQUE: Multidetector CT imaging of the chest was performed following the standard protocol without IV contrast.  COMPARISON: CT 02/02/2014, 08/04/2013, 03/24/2013  FINDINGS: Mediastinum/Nodes: No axillary or supraclavicular lymphadenopathy. No mediastinal hilar lymphadenopathy. Coronary calcifications noted. Esophagus is normal.  Lungs/Pleura: There is interval increase in thickness of a left upper lobe nodule abutting the pleural surface compared to  multiple recent CTs. This nodule measures 8 mm by 8 mm in axial dimension (image 11, series 4) compared to 4 mm by 5 mm on CT of 02/02/2014. In the coronal projection (image 61, series 601) the lesion also appears increased in volume.  There is volume loss in the left hemi thorax consistent with left lower lobectomy. There is no evidence of local recurrence along the resection margin inferior to the left hilum. Centrilobular emphysema is noted in the upper lobes.  Upper abdomen: Limited view of the liver, kidneys, pancreas are unremarkable. Normal adrenal glands. Small gallstones are noted.  Musculoskeletal: No aggressive osseous lesion.  IMPRESSION: 1. Increase in volume of nodular pleural thickening at the left lung apex. Recommend short interval CT follow-up versus restaging FDG PET scan. 2. Stable postoperative change in left hemi thorax. 3. Centrilobular emphysema with an upper lobe predominance.   Electronically Signed  By: Suzy Bouchard M.D.  On: 08/10/2014 12:23  I personally reviewed the CT chest and concur with the findings as noted above.  Impression: Mr. Pilch is an 81 year old man who is now 2 years post left lower lobe superior segmentectomy for stage IA non-small cell carcinoma.  Clinically he is doing well considering his multiple medical problems. He will turn 87 next week.  His CT scan today shows nodular area in the left apex. There has been an abnormality in this area that appeared to be scarring in the setting of diffuse emphysematous disease. On today's scan appears more nodular. It is still relatively small and likely below the resolution of PET CT. It is not favorable for percutaneous biopsy due to the surrounding emphysema, nor is it favorable for bronchoscopic biopsy due to its peripheral location. My recommendation was that we repeat a CT scan in 3 months to see if there is any sign of continued growth. If so we would need to consider PET CT  and/or biopsy.  I do not think he would be a candidate for redo surgery given his advanced age and associated medical problems.  Plan:  Return in 3 months with CT of chest  I spent 10 minutes with Mr. Reale during this visit, greater than 50% was spent in counseling [name], MD [facility] [phone]

## 2014-08-17 ENCOUNTER — Ambulatory Visit (INDEPENDENT_AMBULATORY_CARE_PROVIDER_SITE_OTHER): Payer: Medicare Other | Admitting: Emergency Medicine

## 2014-08-17 VITALS — BP 119/60 | HR 69 | Temp 96.3°F | Resp 18 | Ht 68.0 in | Wt 173.0 lb

## 2014-08-17 DIAGNOSIS — Z23 Encounter for immunization: Secondary | ICD-10-CM

## 2014-08-17 DIAGNOSIS — E785 Hyperlipidemia, unspecified: Secondary | ICD-10-CM

## 2014-08-17 DIAGNOSIS — I10 Essential (primary) hypertension: Secondary | ICD-10-CM

## 2014-08-17 DIAGNOSIS — E1142 Type 2 diabetes mellitus with diabetic polyneuropathy: Secondary | ICD-10-CM

## 2014-08-17 LAB — BASIC METABOLIC PANEL WITH GFR
BUN: 28 mg/dL — ABNORMAL HIGH (ref 7–25)
CALCIUM: 10.1 mg/dL (ref 8.6–10.3)
CHLORIDE: 99 mmol/L (ref 98–110)
CO2: 31 mmol/L (ref 20–31)
Creat: 1.26 mg/dL — ABNORMAL HIGH (ref 0.70–1.11)
GFR, EST AFRICAN AMERICAN: 60 mL/min (ref >=60)
GFR, Est Non African American: 52 mL/min — ABNORMAL LOW (ref >=60)
GLUCOSE: 142 mg/dL — AB (ref 65–99)
Potassium: 4.5 mmol/L (ref 3.5–5.3)
SODIUM: 138 mmol/L (ref 135–146)

## 2014-08-17 LAB — LIPID PANEL
CHOLESTEROL: 186 mg/dL (ref 125–200)
HDL: 40 mg/dL (ref >=40)
LDL Cholesterol: 99 mg/dL (ref <130)
Total CHOL/HDL Ratio: 4.7 Ratio (ref <=5.0)
Triglycerides: 234 mg/dL — ABNORMAL HIGH (ref <150)
VLDL: 47 mg/dL — ABNORMAL HIGH (ref <30)

## 2014-08-17 LAB — MICROALBUMIN, URINE: MICROALB UR: 1.4 mg/dL (ref <2.0)

## 2014-08-17 LAB — GLUCOSE, POCT (MANUAL RESULT ENTRY): POC Glucose: 130 mg/dl — AB (ref 70–99)

## 2014-08-17 LAB — POCT GLYCOSYLATED HEMOGLOBIN (HGB A1C): Hemoglobin A1C: 6.5

## 2014-08-17 MED ORDER — GABAPENTIN 100 MG PO CAPS: ORAL_CAPSULE | ORAL | Status: DC

## 2014-08-17 MED ORDER — HYDROCHLOROTHIAZIDE 25 MG PO TABS: 25.0000 mg | ORAL_TABLET | Freq: Every day | ORAL | Status: DC

## 2014-08-17 MED ORDER — PRAVASTATIN SODIUM 20 MG PO TABS: 20.0000 mg | ORAL_TABLET | Freq: Every day | ORAL | Status: DC

## 2014-08-17 MED ORDER — CLONIDINE HCL 0.2 MG PO TABS: 0.2000 mg | ORAL_TABLET | Freq: Two times a day (BID) | ORAL | Status: DC

## 2014-08-17 MED ORDER — ZOSTER VACCINE LIVE 19400 UNT/0.65ML ~~LOC~~ SOLR: 0.6500 mL | Freq: Once | SUBCUTANEOUS | Status: DC

## 2014-08-17 MED ORDER — METOPROLOL SUCCINATE ER 100 MG PO TB24: 100.0000 mg | ORAL_TABLET | Freq: Every day | ORAL | Status: AC

## 2014-08-17 NOTE — Progress Notes (Signed)
Patient ID: Benjamin Patel, male   DOB: [birth year], 81 y.o.   MRN: [id number]    This chart was scribed for Benjamin Jordan, MD by [facility], medical scribe at [facility].The patient was seen in exam room 21 and the patient's care was started at 9:16 AM.  Chief Complaint:  Chief Complaint  Patient presents with  . Follow-up  . Diabetes   HPI: [name] is a 81 y.o. male who reports to [facility] today for a follow up regarding diabetes. Currently not taking any medication for his diabetes. Pt had surgery for lung cancer and may have a recurrence. This is followed by Dr. Roxan Hockey. He has a follow up scan scheduled in October. Regarding his diabetes he has an occasional flare up in his left leg at night. Unsure if the gabapentin has helped. He has not had the the shingles or tetanus vaccine.    Past Medical History  Diagnosis Date  . Mixed hyperlipidemia     takes Pravastatin daily  . Gout     no meds required  . Abnormal prostate exam   . Carotid stenosis   . Cholelithiasis   . Renal insufficiency   . Renal artery stenosis     right  . PAC (premature atrial contraction)   . PVCs (premature ventricular contractions)   . Essential hypertension, benign     takes Clonodine,Losartan,and Metoprolol and HCTZ  . Carotid stenosis right side    Dr.Early does carotid ultrasounds yrly-last one Mar 14-report in epic  . Asthma     as a child  . COPD (chronic obstructive pulmonary disease)     slight   . History of bronchitis   . Pneumonia 02/08/2012  . History of colon polyps   . Enlarged prostate     slightly   . Type II or unspecified type diabetes mellitus without mention of complication, not stated as uncontrolled     takes Metformin daily  . History of shingles   . Skin cancer     Lung Ca- Left Lung  . Arthritis     Gout    Past Surgical History  Procedure Laterality Date  . No past surgeries    . Colonoscopy    . Video assisted thoracoscopy Left  06/16/2012    Procedure: VIDEO ASSISTED THORACOSCOPY;  Surgeon: [name], MD;  Location: [employer];  Service: Thoracic;  Laterality: Left;  . Segmentecomy Left 06/16/2012    Procedure: left lower lobe superior SEGMENTECTOMY with node dissection;  Surgeon: [name], MD;  Location: [place];  Service: Thoracic;  Laterality: Left;  left superior   History   Social History  . Marital Status: Married    Spouse Name: N/A  . Number of Children: 3  . Years of Education: N/A   Occupational History  . retired     Herbalist   Social History Main Topics  . Smoking status: Former Smoker -- 1.50 packs/day for 30 years    Types: Cigarettes    Quit date: 01/16/1976  . Smokeless tobacco: Never Used  . Alcohol Use: No  . Drug Use: No  . Sexual Activity: Not on file   Other Topics Concern  . Not on file   Social History Narrative   Family History  Problem Relation Age of Onset  . Heart disease Mother   . Diabetes Mother     AKA  Right   . Hypertension Mother   . Varicose  Veins Mother   . Kidney disease Mother     One Kidney  . Cancer Father     Lung   Allergies  Allergen Reactions  . Ace Inhibitors Cough  . Codeine Other (See Comments)    GI Upset  . Indomethacin Other (See Comments)    Elevated BP  . Losartan Swelling    ANGIOEDEMA/    Prior to Admission medications   Medication Sig Start Date End Date Taking? Authorizing Provider  cloNIDine (CATAPRES) 0.2 MG tablet TAKE ONE TABLET BY MOUTH TWICE DAILY 08/02/14  Yes Darlyne Russian, MD  gabapentin (NEURONTIN) 100 MG capsule Take 2 capsules up to 3 times a day 05/04/14  Yes Darlyne Russian, MD  hydrochlorothiazide (HYDRODIURIL) 25 MG tablet TAKE ONE TABLET BY MOUTH ONCE DAILY 04/21/14  Yes Darlyne Russian, MD  metoprolol succinate (TOPROL-XL) 100 MG 24 hr tablet TAKE ONE TABLET BY MOUTH ONCE DAILY 08/02/14  Yes Darlyne Russian, MD  pravastatin (PRAVACHOL) 20 MG tablet TAKE ONE TABLET BY MOUTH AT BEDTIME 06/21/14  Yes  Darlyne Russian, MD     ROS: The patient denies fevers, chills, night sweats, unintentional weight loss, chest pain, palpitations, wheezing, dyspnea on exertion, nausea, vomiting, abdominal pain, dysuria, hematuria, melena, numbness, weakness, or tingling.   All other systems have been reviewed and were otherwise negative with the exception of those mentioned in the HPI and as above.    PHYSICAL EXAM: Filed Vitals:   08/17/14 0857  BP: 119/60  Pulse: 69  Temp: 96.3 F (35.7 C)  Resp: 18   Body mass index is 26.31 kg/(m^2).  General: Alert, no acute distress HEENT:  Normocephalic, atraumatic, oropharynx patent. Eye: [name] Cardiovascular:  Regular rate and rhythm, no rubs murmurs or gallops.  No Carotid bruits, radial pulse intact. No pedal edema.  Respiratory: Clear to auscultation bilaterally.  No wheezes, rales, or rhonchi.  No cyanosis, no use of accessory musculature Abdominal: No organomegaly, abdomen is soft and non-tender, positive bowel sounds.  No masses. Musculoskeletal: Gait intact. No edema, tenderness Skin: No rashes. Neurologic: Facial musculature symmetric. Psychiatric: Patient acts appropriately throughout our interaction. Lymphatic: No cervical or submandibular lymphadenopathy  LABS: Results for orders placed or performed in visit on 05/11/14  POCT CBC  Result Value Ref Range   WBC 10.3 (A) 4.6 - 10.2 K/uL   Lymph, poc 2.3 0.6 - 3.4   POC LYMPH PERCENT 22.2 10 - 50 %L   MID (cbc) 0.7 0 - 0.9   POC MID % 7.0 0 - 12 %M   POC Granulocyte 7.3 (A) 2 - 6.9   Granulocyte percent 70.8 37 - 80 %G   RBC 5.09 4.69 - 6.13 M/uL   Hemoglobin 14.8 14.1 - 18.1 g/dL   HCT, POC 46.3 43.5 - 53.7 %   MCV 91.0 80 - 97 fL   MCH, POC 29.1 27 - 31.2 pg   MCHC 31.9 31.8 - 35.4 g/dL   RDW, POC 13.6 %   Platelet Count, POC 257 142 - 424 K/uL   MPV 7.6 0 - 99.8 fL  POCT glucose (manual entry)  Result Value Ref Range   POC Glucose 80 70 - 99 mg/dl   Lab Results    Component Value Date   HGBA1C 6.0 05/04/2014   Results for orders placed or performed in visit on 08/17/14  POCT glycosylated hemoglobin (Hb A1C)  Result Value Ref Range   Hemoglobin A1C 6.5   POCT glucose (manual entry)  Result Value Ref Range   POC Glucose 130 (A) 70 - 99 mg/dl   ASSESSMENT/PLAN: Diabetes is stable. 6.5 is perfect for him. He will continue close follow-up of his abnormal CT of the lung. Recheck 3-4 months.  Gross sideeffects, risk and benefits, and alternatives of medications d/w patient. Patient is aware that all medications have potential sideeffects and we are unable to predict every sideeffect or drug-drug interaction that may occur.   Arlyss Queen MD 08/17/2014 9:16 AM

## 2014-08-18 ENCOUNTER — Encounter: Payer: Self-pay | Admitting: Family Medicine

## 2014-09-27 ENCOUNTER — Encounter: Payer: Self-pay | Admitting: Family

## 2014-09-28 ENCOUNTER — Other Ambulatory Visit: Payer: Self-pay | Admitting: Family

## 2014-09-28 ENCOUNTER — Encounter: Payer: Self-pay | Admitting: Family

## 2014-09-28 ENCOUNTER — Ambulatory Visit (HOSPITAL_COMMUNITY)
Admission: RE | Admit: 2014-09-28 | Discharge: 2014-09-28 | Disposition: A | Discharge status: Home / Self Care | Payer: Medicare Other | Source: Ambulatory Visit | Attending: Family | Admitting: Family

## 2014-09-28 ENCOUNTER — Ambulatory Visit (INDEPENDENT_AMBULATORY_CARE_PROVIDER_SITE_OTHER): Payer: Medicare Other | Admitting: Family

## 2014-09-28 VITALS — BP 136/68 | HR 63 | Temp 96.8°F | Resp 16 | Ht 68.5 in | Wt 176.0 lb

## 2014-09-28 DIAGNOSIS — E782 Mixed hyperlipidemia: Secondary | ICD-10-CM | POA: Insufficient documentation

## 2014-09-28 DIAGNOSIS — I6523 Occlusion and stenosis of bilateral carotid arteries: Secondary | ICD-10-CM | POA: Diagnosis not present

## 2014-09-28 DIAGNOSIS — I10 Essential (primary) hypertension: Secondary | ICD-10-CM | POA: Insufficient documentation

## 2014-09-28 DIAGNOSIS — E119 Type 2 diabetes mellitus without complications: Secondary | ICD-10-CM | POA: Insufficient documentation

## 2014-09-28 NOTE — Progress Notes (Signed)
Established Carotid Patient   History of Present Illness  Benjamin Patel is a 81 y.o. male  patient of Dr. Donnetta Hutching who has known carotid stenosis. He returns today for routine surveillance.  Patient has not had previous carotid artery intervention. He had a painful left knee for a few days, has resolved.  Patient has no history of TIA or stroke symptoms. The patient denies amaurosis fugax or monocular blindness. The patient denies facial drooping. Pt. denies hemiplegia. The patient denies receptive or expressive aphasia. Pt. denies extremity weakness.  Pt denies tingling, numbness, pain, or cold sensation in either hand or arm.  He denies non healing wounds, denies claudication symptoms with walking.   Patient reports New Medical or Surgical History: about 2 weeks ago his blood pressure increased to 786'V systolic; pt states his blood pressure came down to 672-094 systolic that evening which is what it usually runs.  Pt would like my progress note sent to Dr. Roxan Hockey, his thoracic surgeon, Dr. Marlou Porch, his cardiologist, in addition to his PCP, Dr. Vic Ripper. He had a wedge resection left lung for cancer, June, 2014.  Pt Diabetic: Yes, he states his last A1C was 6.0  Pt smoker: former smoker, quit in 1978  Pt meds include: Statin : Yes ASA: no, stopped by his cardiologist per pt Other anticoagulants/antiplatelets: no    Past Medical History  Diagnosis Date  . Mixed hyperlipidemia     takes Pravastatin daily  . Gout     no meds required  . Abnormal prostate exam   . Carotid stenosis   . Cholelithiasis   . Renal insufficiency   . Renal artery stenosis     right  . PAC (premature atrial contraction)   . PVCs (premature ventricular contractions)   . Essential hypertension, benign     takes Clonodine,Losartan,and Metoprolol and HCTZ  . Carotid stenosis right side    Dr.Early does carotid ultrasounds yrly-last one Mar 14-report in epic  . Asthma     as a child  .  COPD (chronic obstructive pulmonary disease)     slight   . History of bronchitis   . Pneumonia 02/08/2012  . History of colon polyps   . Enlarged prostate     slightly   . Type II or unspecified type diabetes mellitus without mention of complication, not stated as uncontrolled     takes Metformin daily  . History of shingles   . Skin cancer     Lung Ca- Left Lung  . Arthritis     Gout     Social History Social History  Substance Use Topics  . Smoking status: Former Smoker -- 1.50 packs/day for 30 years    Types: Cigarettes    Quit date: 01/16/1976  . Smokeless tobacco: Never Used  . Alcohol Use: No    Family History Family History  Problem Relation Age of Onset  . Heart disease Mother   . Diabetes Mother     AKA  Right   . Hypertension Mother   . Varicose Veins Mother   . Kidney disease Mother     One Kidney  . Cancer Father     Lung    Surgical History Past Surgical History  Procedure Laterality Date  . No past surgeries    . Colonoscopy    . Video assisted thoracoscopy Left 06/16/2012    Procedure: VIDEO ASSISTED THORACOSCOPY;  Surgeon: [name], MD;  Location: [facility];  Service: Thoracic;  Laterality: Left;  .  Segmentecomy Left 06/16/2012    Procedure: left lower lobe superior SEGMENTECTOMY with node dissection;  Surgeon: [name], MD;  Location: [facility];  Service: Thoracic;  Laterality: Left;  left superior    Allergies  Allergen Reactions  . Ace Inhibitors Cough  . Codeine Other (See Comments)    GI Upset  . Indomethacin Other (See Comments)    Elevated BP  . Losartan Swelling    ANGIOEDEMA/     Current Outpatient Prescriptions  Medication Sig Dispense Refill  . cloNIDine (CATAPRES) 0.2 MG tablet Take 1 tablet (0.2 mg total) by mouth 2 (two) times daily. 60 tablet 11  . gabapentin (NEURONTIN) 100 MG capsule Take 2 capsules up to 3 times a day 180 capsule 11  . hydrochlorothiazide (HYDRODIURIL) 25 MG tablet Take 1 tablet (25 mg  total) by mouth daily. 30 tablet 11  . metoprolol succinate (TOPROL-XL) 100 MG 24 hr tablet Take 1 tablet (100 mg total) by mouth daily. Take with or immediately following a meal. 30 tablet 11  . pravastatin (PRAVACHOL) 20 MG tablet Take 1 tablet (20 mg total) by mouth at bedtime. 30 tablet 11  . zoster vaccine live, PF, (ZOSTAVAX) 35573 UNT/0.65ML injection Inject 19,400 Units into the skin once. (Patient not taking: Reported on 09/28/2014) 1 each 0   No current facility-administered medications for this visit.    Review of Systems : See HPI for pertinent positives and negatives.  Physical Examination  Filed Vitals:   09/28/14 1354 09/28/14 1359  BP: 140/79 136/68  Pulse: 69 63  Temp: 96.8 F (36 C)   Resp: 16   Height: 5' 8.5" (1.74 m)   Weight: 176 lb (79.833 kg)   SpO2: 97%    Body mass index is 26.37 kg/(m^2).  General: WDWN male in NAD GAIT: normal Eyes: PERRLA Pulmonary: Non-labored, CTAB, Negative Rales, Negative rhonchi, & Negative wheezing.  Cardiac: regular Rhythm, no detected murmur.  VASCULAR EXAM Carotid Bruits Left Right   Positive Positive   Aorta is not palpable. Radial pulses are 2+ palpable and equal.      LE Pulses LEFT RIGHT   POPLITEAL not palpable  not palpable   POSTERIOR TIBIAL  palpable   palpable    DORSALIS PEDIS  ANTERIOR TIBIAL palpable  palpable     Gastrointestinal: soft, nontender, BS WNL, no r/g,no palpable masses.  Musculoskeletal: Negative muscle atrophy/wasting. M/S 5/5 throughout, Extremities without ischemic changes.  Neurologic: A&O X 3; Appropriate Affect,  Speech is normal CN 2-12 intact, Pain and light touch intact in extremities, Motor exam as listed above.         Non-Invasive Vascular Imaging CAROTID DUPLEX 09/28/2014   CEREBROVASCULAR  DUPLEX EVALUATION    INDICATION: Carotid stenosis    PREVIOUS INTERVENTION(S): NA    DUPLEX EXAM:     RIGHT  LEFT  Peak Systolic Velocities (cm/s) End Diastolic Velocities (cm/s) Plaque LOCATION Peak Systolic Velocities (cm/s) End Diastolic Velocities (cm/s) Plaque  69 10  CCA PROXIMAL 89 14   72 13  CCA MID 90 15   152 26 CP CCA DISTAL 105/253 13/26 CP  247 2 CP ECA 247 17 CP  124 16 CP ICA PROXIMAL 213 21 CP  124 25  ICA MID 89 18   111 24  ICA DISTAL 92 24     1.72 ICA / CCA Ratio (PSV) 2.37  To-Fro Vertebral Flow Antegrade  220 Brachial Systolic Pressure (mmHg) 254  Triphasic Brachial Artery Waveforms Triphasic  Plaque Morphology:  HM = Homogeneous, HT = Heterogeneous, CP = Calcific Plaque, SP = Smooth Plaque, IP = Irregular Plaque     ADDITIONAL FINDINGS: Right subclavian artery PSV118cm/sec; Left subclavian artery PSV180cm/sec.    IMPRESSION: Bilateral distal common carotid artery disease present of greater than 50% Bilateral internal carotid artery stenosis present in the less than 40% range, may be underestimated due to presence of distal common carotid disease and calcific plaque making Doppler interrogation difficult. Bilateral external carotid artery stenosis present. Brachial pressure gradient present with abnormal to-fro right vertebral and dampened phasicity of the right subclavian artery suggestive of possible subclavian steal syndrome.    Compared to the previous exam:  Essentially unchanged since previous study on 09/23/2013.      Assessment: Benjamin Patel is a 81 y.o. male who has no history of stroke or TIA. He has no steal symptoms in his upper extremities. Today's carotid Duplex suggests bilateral internal carotid artery stenosis present in the less than 40% range, may be underestimated due to presence of distal common carotid disease and calcific plaque making Doppler interrogation difficult. Brachial pressure gradient present with abnormal to-fro  right vertebral and dampened phasicity of the right subclavian artery suggestive of possible subclavian steal syndrome. Essentially unchanged since previous study on 09/23/2013.   Plan: Follow-up in 6 months with Carotid Duplex.   I discussed in depth with the patient the nature of atherosclerosis, and emphasized the importance of maximal medical management including strict control of blood pressure, blood glucose, and lipid levels, obtaining regular exercise, and continued cessation of smoking.  The patient is aware that without maximal medical management the underlying atherosclerotic disease process will progress, limiting the benefit of any interventions. The patient was given information about stroke prevention and what symptoms should prompt the patient to seek immediate medical care. Thank you for allowing [place] to participate in this patient's care.  Clemon Chambers, RN, MSN, FNP-C [facility] Office: [phone]  Clinic Physician: Trula Slade on call  09/28/2014 2:05 PM

## 2014-09-28 NOTE — Patient Instructions (Signed)
Stroke Prevention Some medical conditions and behaviors are associated with an increased chance of having a stroke. You may prevent a stroke by making healthy choices and managing medical conditions. HOW CAN I REDUCE MY RISK OF HAVING A STROKE?   Stay physically active. Get at least 30 minutes of activity on most or all days.  Do not smoke. It may also be helpful to avoid exposure to secondhand smoke.  Limit alcohol use. Moderate alcohol use is considered to be:  No more than 2 drinks per day for men.  No more than 1 drink per day for nonpregnant women.  Eat healthy foods. This involves:  Eating 5 or more servings of fruits and vegetables a day.  Making dietary changes that address high blood pressure (hypertension), high cholesterol, diabetes, or obesity.  Manage your cholesterol levels.  Making food choices that are high in fiber and low in saturated fat, trans fat, and cholesterol may control cholesterol levels.  Take any prescribed medicines to control cholesterol as directed by your health care provider.  Manage your diabetes.  Controlling your carbohydrate and sugar intake is recommended to manage diabetes.  Take any prescribed medicines to control diabetes as directed by your health care provider.  Control your hypertension.  Making food choices that are low in salt (sodium), saturated fat, trans fat, and cholesterol is recommended to manage hypertension.  Take any prescribed medicines to control hypertension as directed by your health care provider.  Maintain a healthy weight.  Reducing calorie intake and making food choices that are low in sodium, saturated fat, trans fat, and cholesterol are recommended to manage weight.  Stop drug abuse.  Avoid taking birth control pills.  Talk to your health care provider about the risks of taking birth control pills if you are over 35 years old, smoke, get migraines, or have ever had a blood clot.  Get evaluated for sleep  disorders (sleep apnea).  Talk to your health care provider about getting a sleep evaluation if you snore a lot or have excessive sleepiness.  Take medicines only as directed by your health care provider.  For some people, aspirin or blood thinners (anticoagulants) are helpful in reducing the risk of forming abnormal blood clots that can lead to stroke. If you have the irregular heart rhythm of atrial fibrillation, you should be on a blood thinner unless there is a good reason you cannot take them.  Understand all your medicine instructions.  Make sure that other conditions (such as anemia or atherosclerosis) are addressed. SEEK IMMEDIATE MEDICAL CARE IF:   You have sudden weakness or numbness of the face, arm, or leg, especially on one side of the body.  Your face or eyelid droops to one side.  You have sudden confusion.  You have trouble speaking (aphasia) or understanding.  You have sudden trouble seeing in one or both eyes.  You have sudden trouble walking.  You have dizziness.  You have a loss of balance or coordination.  You have a sudden, severe headache with no known cause.  You have new chest pain or an irregular heartbeat. Any of these symptoms may represent a serious problem that is an emergency. Do not wait to see if the symptoms will go away. Get medical help at once. Call your local emergency services (911 in [place] Do not drive yourself to the hospital. Document Released: 02/09/2004 Document Revised: 05/18/2013 Document Reviewed: 07/04/2012 Exit[employer] Patient Information 2015 [employer]. This information is not intended to replace advice given   to you by your health care provider. Make sure you discuss any questions you have with your health care provider.  

## 2014-10-12 DIAGNOSIS — D3132 Benign neoplasm of left choroid: Secondary | ICD-10-CM | POA: Diagnosis not present

## 2014-10-12 DIAGNOSIS — E119 Type 2 diabetes mellitus without complications: Secondary | ICD-10-CM | POA: Diagnosis not present

## 2014-10-12 LAB — HM DIABETES EYE EXAM

## 2014-10-19 ENCOUNTER — Encounter: Payer: Self-pay | Admitting: Emergency Medicine

## 2014-10-20 ENCOUNTER — Ambulatory Visit (INDEPENDENT_AMBULATORY_CARE_PROVIDER_SITE_OTHER): Payer: Medicare Other | Admitting: Radiology

## 2014-10-20 DIAGNOSIS — Z23 Encounter for immunization: Secondary | ICD-10-CM

## 2014-10-21 ENCOUNTER — Other Ambulatory Visit: Payer: Self-pay | Admitting: *Deleted

## 2014-10-21 DIAGNOSIS — C3490 Malignant neoplasm of unspecified part of unspecified bronchus or lung: Secondary | ICD-10-CM

## 2014-11-02 ENCOUNTER — Encounter: Payer: Self-pay | Admitting: Thoracic Surgery (Cardiothoracic Vascular Surgery)

## 2014-11-02 ENCOUNTER — Other Ambulatory Visit: Payer: Self-pay | Admitting: *Deleted

## 2014-11-02 ENCOUNTER — Ambulatory Visit
Admission: RE | Admit: 2014-11-02 | Discharge: 2014-11-02 | Disposition: A | Discharge status: Home / Self Care | Payer: Medicare Other | Source: Ambulatory Visit | Attending: Thoracic Surgery (Cardiothoracic Vascular Surgery) | Admitting: Thoracic Surgery (Cardiothoracic Vascular Surgery)

## 2014-11-02 ENCOUNTER — Ambulatory Visit (INDEPENDENT_AMBULATORY_CARE_PROVIDER_SITE_OTHER): Payer: Medicare Other | Admitting: Thoracic Surgery (Cardiothoracic Vascular Surgery)

## 2014-11-02 VITALS — BP 159/70 | HR 80 | Resp 20 | Ht 68.5 in | Wt 175.0 lb

## 2014-11-02 DIAGNOSIS — I6523 Occlusion and stenosis of bilateral carotid arteries: Secondary | ICD-10-CM

## 2014-11-02 DIAGNOSIS — R911 Solitary pulmonary nodule: Secondary | ICD-10-CM | POA: Diagnosis not present

## 2014-11-02 DIAGNOSIS — Z85118 Personal history of other malignant neoplasm of bronchus and lung: Secondary | ICD-10-CM

## 2014-11-02 DIAGNOSIS — C3490 Malignant neoplasm of unspecified part of unspecified bronchus or lung: Secondary | ICD-10-CM

## 2014-11-02 DIAGNOSIS — C3432 Malignant neoplasm of lower lobe, left bronchus or lung: Secondary | ICD-10-CM | POA: Diagnosis not present

## 2014-11-02 NOTE — Progress Notes (Signed)
[address]       [place] [address]             [phone]       HPI:  [name] returns for 3 month follow-up.  He has an 81 year old man with a past medical history significant for stage I non-small cell carcinoma resected with a left lower lobe superior segmentectomy in June 2014. His other medical problems include hypertension, hyperlipidemia, gout, carotid artery disease, type 2 diabetes with peripheral vascular complications, stage II chronic kidney disease, GOLD 3 COPD, coronary artery disease, and atrial fibrillation.  I saw him back in July for a 2 year follow-up visit. There was a questionable nodule in the left apex on CT. I recommended a 3 month interval follow-up.  He says that he's been feeling well in the interim since his last visit. He's not having any chest pain. He has some chronic shortness of breath with exertion. That has not changed. His weight is stable and his appetite is good. He's not been having any unusual headaches or visual changes.  Zubrod Score: At the time of surgery this patient's most appropriate activity status/level should be described as: '[]'$     0    Normal activity, no symptoms '[]'$     1    Restricted in physical strenuous activity but ambulatory, able to do out light work '[x]'$     2    Ambulatory and capable of self care, unable to do work activities, up and about >50 % of waking hours                              '[]'$     3    Only limited self care, in bed greater than 50% of waking hours '[]'$     4    Completely disabled, no self care, confined to bed or chair '[]'$     5    Moribund  Past Medical History  Diagnosis Date  . Mixed hyperlipidemia     takes Pravastatin daily  . Gout     no meds required  . Abnormal prostate exam   . Carotid stenosis   . Cholelithiasis   . Renal insufficiency   . Renal artery stenosis (HCC)     right  . PAC (premature atrial contraction)   . PVCs (premature ventricular contractions)   .  Essential hypertension, benign     takes Clonodine,Losartan,and Metoprolol and HCTZ  . Carotid stenosis right side    Dr.Early does carotid ultrasounds yrly-last one Mar 14-report in epic  . Asthma     as a child  . COPD (chronic obstructive pulmonary disease) (HCC)     slight   . History of bronchitis   . Pneumonia 02/08/2012  . History of colon polyps   . Enlarged prostate     slightly   . Type II or unspecified type diabetes mellitus without mention of complication, not stated as uncontrolled     takes Metformin daily  . History of shingles   . Skin cancer     Lung Ca- Left Lung  . Arthritis     Gout      Current Outpatient Prescriptions  Medication Sig Dispense Refill  . cloNIDine (CATAPRES) 0.2 MG tablet Take 1 tablet (0.2 mg total) by mouth 2 (two) times daily. 60 tablet 11  . gabapentin (NEURONTIN) 100 MG capsule Take 2 capsules up to 3  times a day 180 capsule 11  . hydrochlorothiazide (HYDRODIURIL) 25 MG tablet Take 1 tablet (25 mg total) by mouth daily. 30 tablet 11  . metoprolol succinate (TOPROL-XL) 100 MG 24 hr tablet Take 1 tablet (100 mg total) by mouth daily. Take with or immediately following a meal. 30 tablet 11  . pravastatin (PRAVACHOL) 20 MG tablet Take 1 tablet (20 mg total) by mouth at bedtime. 30 tablet 11  . zoster vaccine live, PF, (ZOSTAVAX) 60454 UNT/0.65ML injection Inject 19,400 Units into the skin once. (Patient not taking: Reported on 09/28/2014) 1 each 0   No current facility-administered medications for this visit.    Physical Exam BP 159/70 mmHg  Pulse 80  Resp 20  Ht 5' 8.5" (1.74 m)  Wt 175 lb (79.379 kg)  BMI 26.22 kg/m2  SpO2 93% Elderly man in no acute distress Alert and oriented 3 with no focal neurologic deficits EOMI, sclerae clear, No cervical or supraclavicular adenopathy Cardiac regular rate and rhythm normal S1 and S2 Lungs with diminished breath sounds bilaterally, no wheezing   Diagnostic Tests: CT CHEST WITHOUT  CONTRAST  TECHNIQUE: Multidetector CT imaging of the chest was performed following the standard protocol without IV contrast.  COMPARISON: Chest CTs ranging from 10/21/2012 through 08/10/2014.  FINDINGS: Mediastinum/Nodes: There are no enlarged mediastinal, hilar or axillary lymph nodes. The thyroid gland, trachea and esophagus demonstrate no significant findings. The heart size is normal. There is no pericardial effusion. Extensive atherosclerosis of the aorta, great vessels and coronary arteries again noted.  Lungs/Pleura: There is no pleural effusion. There is stable volume loss in the left hemithorax status post left lower lobe wedge resection. The subpleural nodule anteriorly at the left apex demonstrates progressive enlargement, measuring 10 x 15 mm on image 9. This nodule is spiculated and remains highly worrisome for metachronous lung cancer. No other suspicious pulmonary nodules are identified. There is no evidence of recurrence along the suture line. Moderate emphysematous changes are noted.  Upper abdomen: The visualized upper abdomen appears stable without acute findings. Gallstones and a nonobstructing left renal calculus are noted.  Musculoskeletal/Chest wall: There is no chest wall mass or suspicious osseous finding.  IMPRESSION: 1. Further enlargement of spiculated left apical nodule, worrisome for metachronous lung cancer. Further evaluation with PET-CT recommended. 2. No evidence of local recurrence or metastatic disease. 3. Stable emphysema, atherosclerosis and incidental findings in the upper abdomen.   Electronically Signed  By: Richardean Sale M.D.  On: 11/02/2014 08:22 I personally reviewed CT chest and concur with the findings noted above. Impression: 81 year old man with a history of a stage IA non-small cell carcinoma treated with a left lower lobe superior segmentectomy in June 2014. He has multiple other comorbid medical issues. His  CT in July for his 2 year follow-up showed a possible left apical nodule. There was some scarring in this area so it was not entirely clear that this was a new nodule, therefore recommended a repeat CT at 3 months. On his CT scan today that area is clearly larger and is highly suspicious for a second metachronous primary bronchogenic carcinoma.  This nodule is in a difficult location to deal with. It is surrounded by emphysematous lung and he would be a high risk for a pneumothorax with CT-guided biopsy. It would be extremely difficult to access with navigational bronchoscopy.  I think the next step is to do a PET/CT to get additional information about the nodule and rule out any other areas that need to be  addressed.  We did briefly discuss treatment options should this turned out to require treatment. Those options include redo left VATS for surgical resection and stereotactic radiation. He would be a high-risk surgical candidate due to his comorbidities and the redo nature of the procedure. Radiation might be a better option for him, but we can discuss that further once we have more information.  Plan:  PET/CT- new enlarging left upper lobe nodule and patient with history of lung cancer, guide diagnostic workup.  PFTs with him without bronchodilators.  I will see him back in 2 weeks to discuss the results of the PET/CT and how to proceed  I spent 15 minutes with Mr. Agner during this visit, greater than 50% was spent counseling. [name], MD [facility] [phone]

## 2014-11-04 ENCOUNTER — Ambulatory Visit (INDEPENDENT_AMBULATORY_CARE_PROVIDER_SITE_OTHER): Payer: Medicare Other | Admitting: Family Medicine

## 2014-11-04 VITALS — BP 138/60 | HR 87 | Temp 97.6°F | Resp 18 | Ht 69.0 in | Wt 177.0 lb

## 2014-11-04 DIAGNOSIS — R059 Cough, unspecified: Secondary | ICD-10-CM

## 2014-11-04 DIAGNOSIS — I6523 Occlusion and stenosis of bilateral carotid arteries: Secondary | ICD-10-CM

## 2014-11-04 DIAGNOSIS — J329 Chronic sinusitis, unspecified: Secondary | ICD-10-CM | POA: Diagnosis not present

## 2014-11-04 DIAGNOSIS — R05 Cough: Secondary | ICD-10-CM

## 2014-11-04 DIAGNOSIS — R0982 Postnasal drip: Secondary | ICD-10-CM

## 2014-11-04 MED ORDER — BENZONATATE 100 MG PO CAPS
100.0000 mg | ORAL_CAPSULE | Freq: Three times a day (TID) | ORAL | Status: DC | PRN
Start: 2014-11-04 — End: 2014-11-16

## 2014-11-04 MED ORDER — HYDROCODONE-HOMATROPINE 5-1.5 MG/5ML PO SYRP: 5.0000 mL | ORAL_SOLUTION | ORAL | Status: DC | PRN

## 2014-11-04 MED ORDER — ALBUTEROL SULFATE HFA 108 (90 BASE) MCG/ACT IN AERS
2.0000 | INHALATION_SPRAY | RESPIRATORY_TRACT | Status: DC | PRN
Start: 2014-11-04 — End: 2014-11-16

## 2014-11-04 MED ORDER — AZITHROMYCIN 250 MG PO TABS: ORAL_TABLET | ORAL | Status: DC

## 2014-11-04 NOTE — Patient Instructions (Signed)
Drink plenty of fluids  Take azithromycin 250 mg 2 pills today, then 1 daily for 4 days for antibiotic  Take the benzonatate cough pills one or 2 pills 3 times daily as needed for cough  If you develop a severe cough hydrocodone cough syrup 1/2-1 teaspoon every 4-6 hours. Because of your age I would like you to start with just one half of a teaspoon and make sure you tolerated okay.  Use the albuterol inhaler 2 inhalations every 4-6 hours as needed for rattling in the chest  If not improving please return

## 2014-11-04 NOTE — Progress Notes (Signed)
Patient ID: Benjamin Patel, male    DOB: 12/23/1927  Age: 81 y.o. MRN: [id number]  Chief complaint: Cough  Subjective:   Patient is here with a cough that is developed the last couple of days. He feels a lot of rattling in the chest. He is not coughing up a lot of phlegm. He does have some postnasal drainage. He does not smoke.  He is currently being worked up for a nodule on his lung. He has had a CT scan of the chest which confirmed that it had grown a little bit from earlier this year. They are therefore schedule him for further studies including pulmonary function studies and a PET scan. Those tests are supposed to be done next Wednesday, and he would like to be well enough to get everything done.  Current allergies, medications, problem list, past/family and social histories reviewed.  Objective:  BP 138/60 mmHg  Pulse 87  Temp(Src) 97.6 F (36.4 C) (Oral)  Resp 18  Ht '5\' 9"'$  (1.753 m)  Wt 177 lb (80.287 kg)  BMI 26.13 kg/m2  SpO2 95%  No major acute distress. His TMs are normal. Throat not erythematous but has thick colored mucus coming down from behind the left side. Neck supple without significant nodes. Chest is clear to auscultation right now. Heart regular without murmurs.  Assessment & Plan:   Assessment: 1. Cough   2. Post-nasal discharge       Plan: We will go ahead and treat fairly aggressively because of his other health conditions.    Meds ordered this encounter  Medications  . azithromycin (ZITHROMAX) 250 MG tablet    Sig: Take 2 initially, then 1 daily for 4 days    Dispense:  6 tablet    Refill:  0  . benzonatate (TESSALON) 100 MG capsule    Sig: Take 1-2 capsules (100-200 mg total) by mouth 3 (three) times daily as needed.    Dispense:  30 capsule    Refill:  0  . HYDROcodone-homatropine (HYCODAN) 5-1.5 MG/5ML syrup    Sig: Take 5 mLs by mouth every 4 (four) hours as needed.    Dispense:  120 mL    Refill:  0  . albuterol (PROVENTIL HFA;VENTOLIN  HFA) 108 (90 BASE) MCG/ACT inhaler    Sig: Inhale 2 puffs into the lungs every 4 (four) hours as needed for wheezing or shortness of breath (cough, shortness of breath or wheezing.).    Dispense:  1 Inhaler    Refill:  1         Patient Instructions  Drink plenty of fluids  Take azithromycin 250 mg 2 pills today, then 1 daily for 4 days for antibiotic  Take the benzonatate cough pills one or 2 pills 3 times daily as needed for cough  If you develop a severe cough hydrocodone cough syrup 1/2-1 teaspoon every 4-6 hours. Because of your age I would like you to start with just one half of a teaspoon and make sure you tolerated okay.  Use the albuterol inhaler 2 inhalations every 4-6 hours as needed for rattling in the chest  If not improving please return    Return if symptoms worsen or fail to improve.   Kionte Baumgardner, MD 11/04/2014

## 2014-11-10 ENCOUNTER — Ambulatory Visit (HOSPITAL_COMMUNITY)
Admission: RE | Admit: 2014-11-10 | Discharge: 2014-11-10 | Disposition: A | Discharge status: Home / Self Care | Payer: Medicare Other | Source: Ambulatory Visit | Attending: Thoracic Surgery (Cardiothoracic Vascular Surgery) | Admitting: Thoracic Surgery (Cardiothoracic Vascular Surgery)

## 2014-11-10 DIAGNOSIS — N261 Atrophy of kidney (terminal): Secondary | ICD-10-CM | POA: Insufficient documentation

## 2014-11-10 DIAGNOSIS — J439 Emphysema, unspecified: Secondary | ICD-10-CM | POA: Insufficient documentation

## 2014-11-10 DIAGNOSIS — Z85118 Personal history of other malignant neoplasm of bronchus and lung: Secondary | ICD-10-CM | POA: Insufficient documentation

## 2014-11-10 DIAGNOSIS — I6523 Occlusion and stenosis of bilateral carotid arteries: Secondary | ICD-10-CM | POA: Diagnosis not present

## 2014-11-10 DIAGNOSIS — K5730 Diverticulosis of large intestine without perforation or abscess without bleeding: Secondary | ICD-10-CM | POA: Diagnosis not present

## 2014-11-10 DIAGNOSIS — I700 Atherosclerosis of aorta: Secondary | ICD-10-CM | POA: Diagnosis not present

## 2014-11-10 DIAGNOSIS — N200 Calculus of kidney: Secondary | ICD-10-CM | POA: Insufficient documentation

## 2014-11-10 DIAGNOSIS — I2510 Atherosclerotic heart disease of native coronary artery without angina pectoris: Secondary | ICD-10-CM | POA: Insufficient documentation

## 2014-11-10 DIAGNOSIS — R911 Solitary pulmonary nodule: Secondary | ICD-10-CM

## 2014-11-10 DIAGNOSIS — K8020 Calculus of gallbladder without cholecystitis without obstruction: Secondary | ICD-10-CM | POA: Insufficient documentation

## 2014-11-10 LAB — PULMONARY FUNCTION TEST
DL/VA % PRED: 50 %
DL/VA: 2.24 ml/min/mmHg/L
DLCO unc % pred: 17 %
DLCO unc: 5.25 ml/min/mmHg
FEF 25-75 POST: 0.28 L/s
FEF 25-75 Pre: 0.21 L/sec
FEF2575-%CHANGE-POST: 32 %
FEF2575-%PRED-POST: 18 %
FEF2575-%Pred-Pre: 13 %
FEV1-%CHANGE-POST: 8 %
FEV1-%PRED-PRE: 30 %
FEV1-%Pred-Post: 33 %
FEV1-PRE: 0.73 L
FEV1-Post: 0.79 L
FEV1FVC-%Change-Post: 4 %
FEV1FVC-%PRED-PRE: 60 %
FEV6-%Change-Post: 7 %
FEV6-%Pred-Post: 49 %
FEV6-%Pred-Pre: 46 %
FEV6-PRE: 1.48 L
FEV6-Post: 1.59 L
FEV6FVC-%Change-Post: 2 %
FEV6FVC-%PRED-PRE: 92 %
FEV6FVC-%Pred-Post: 95 %
FVC-%CHANGE-POST: 4 %
FVC-%PRED-POST: 51 %
FVC-%PRED-PRE: 49 %
FVC-POST: 1.8 L
FVC-PRE: 1.73 L
POST FEV1/FVC RATIO: 44 %
PRE FEV6/FVC RATIO: 85 %
Post FEV6/FVC ratio: 88 %
Pre FEV1/FVC ratio: 42 %
RV % PRED: 204 %
RV: 5.44 L
TLC % pred: 105 %
TLC: 7.04 L

## 2014-11-10 MED ORDER — FLUDEOXYGLUCOSE F - 18 (FDG) INJECTION
8.7000 | Freq: Once | INTRAVENOUS | Status: DC | PRN
  Administered 2014-11-10: 8.7 via INTRAVENOUS
  Filled 2014-11-10: qty 8.7

## 2014-11-10 MED ORDER — ALBUTEROL SULFATE (2.5 MG/3ML) 0.083% IN NEBU
2.5000 mg | INHALATION_SOLUTION | Freq: Once | RESPIRATORY_TRACT | Status: AC
  Administered 2014-11-10: 2.5 mg via RESPIRATORY_TRACT

## 2014-11-12 LAB — GLUCOSE, CAPILLARY: GLUCOSE-CAPILLARY: 128 mg/dL — AB (ref 65–99)

## 2014-11-16 ENCOUNTER — Encounter (HOSPITAL_COMMUNITY): Payer: Self-pay

## 2014-11-16 ENCOUNTER — Encounter: Payer: Self-pay | Admitting: Thoracic Surgery (Cardiothoracic Vascular Surgery)

## 2014-11-16 ENCOUNTER — Ambulatory Visit (INDEPENDENT_AMBULATORY_CARE_PROVIDER_SITE_OTHER): Payer: Medicare Other | Admitting: Thoracic Surgery (Cardiothoracic Vascular Surgery)

## 2014-11-16 ENCOUNTER — Emergency Department (HOSPITAL_COMMUNITY)
Admission: EM | Admit: 2014-11-16 | Discharge: 2014-11-16 | Disposition: A | Discharge status: Home / Self Care | Payer: Medicare Other | Attending: Emergency Medicine | Admitting: Emergency Medicine

## 2014-11-16 VITALS — BP 196/81 | HR 82 | Resp 16 | Ht 69.0 in | Wt 177.0 lb

## 2014-11-16 DIAGNOSIS — Z85118 Personal history of other malignant neoplasm of bronchus and lung: Secondary | ICD-10-CM | POA: Diagnosis not present

## 2014-11-16 DIAGNOSIS — R000 Tachycardia, unspecified: Secondary | ICD-10-CM | POA: Insufficient documentation

## 2014-11-16 DIAGNOSIS — Z8701 Personal history of pneumonia (recurrent): Secondary | ICD-10-CM | POA: Insufficient documentation

## 2014-11-16 DIAGNOSIS — E7800 Pure hypercholesterolemia, unspecified: Secondary | ICD-10-CM | POA: Insufficient documentation

## 2014-11-16 DIAGNOSIS — Z87891 Personal history of nicotine dependence: Secondary | ICD-10-CM | POA: Diagnosis not present

## 2014-11-16 DIAGNOSIS — Z86010 Personal history of colonic polyps: Secondary | ICD-10-CM | POA: Diagnosis not present

## 2014-11-16 DIAGNOSIS — I6523 Occlusion and stenosis of bilateral carotid arteries: Secondary | ICD-10-CM | POA: Diagnosis not present

## 2014-11-16 DIAGNOSIS — Z8619 Personal history of other infectious and parasitic diseases: Secondary | ICD-10-CM | POA: Insufficient documentation

## 2014-11-16 DIAGNOSIS — I10 Essential (primary) hypertension: Secondary | ICD-10-CM | POA: Insufficient documentation

## 2014-11-16 DIAGNOSIS — C3432 Malignant neoplasm of lower lobe, left bronchus or lung: Secondary | ICD-10-CM

## 2014-11-16 DIAGNOSIS — J449 Chronic obstructive pulmonary disease, unspecified: Secondary | ICD-10-CM | POA: Insufficient documentation

## 2014-11-16 DIAGNOSIS — Z87448 Personal history of other diseases of urinary system: Secondary | ICD-10-CM | POA: Insufficient documentation

## 2014-11-16 DIAGNOSIS — E119 Type 2 diabetes mellitus without complications: Secondary | ICD-10-CM | POA: Insufficient documentation

## 2014-11-16 DIAGNOSIS — Z79899 Other long term (current) drug therapy: Secondary | ICD-10-CM | POA: Diagnosis not present

## 2014-11-16 DIAGNOSIS — M1990 Unspecified osteoarthritis, unspecified site: Secondary | ICD-10-CM | POA: Insufficient documentation

## 2014-11-16 LAB — CBC WITH DIFFERENTIAL/PLATELET
BASOS ABS: 0 10*3/uL (ref 0.0–0.1)
Basophils Relative: 0 %
Eosinophils Absolute: 0.1 10*3/uL (ref 0.0–0.7)
Eosinophils Relative: 1 %
HEMATOCRIT: 43.8 % (ref 39.0–52.0)
Hemoglobin: 14.8 g/dL (ref 13.0–17.0)
LYMPHS ABS: 2.3 10*3/uL (ref 0.7–4.0)
LYMPHS PCT: 24 %
MCH: 30.1 pg (ref 26.0–34.0)
MCHC: 33.8 g/dL (ref 30.0–36.0)
MCV: 89 fL (ref 78.0–100.0)
MONO ABS: 0.7 10*3/uL (ref 0.1–1.0)
Monocytes Relative: 7 %
NEUTROS ABS: 6.4 10*3/uL (ref 1.7–7.7)
Neutrophils Relative %: 68 %
Platelets: 219 10*3/uL (ref 150–400)
RBC: 4.92 MIL/uL (ref 4.22–5.81)
RDW: 12.4 % (ref 11.5–15.5)
WBC: 9.5 10*3/uL (ref 4.0–10.5)

## 2014-11-16 LAB — COMPREHENSIVE METABOLIC PANEL
ALK PHOS: 83 U/L (ref 38–126)
ALT: 15 U/L — AB (ref 17–63)
AST: 22 U/L (ref 15–41)
Albumin: 4.1 g/dL (ref 3.5–5.0)
Anion gap: 11 (ref 5–15)
BUN: 21 mg/dL — AB (ref 6–20)
CALCIUM: 9.8 mg/dL (ref 8.9–10.3)
CO2: 28 mmol/L (ref 22–32)
CREATININE: 1.15 mg/dL (ref 0.61–1.24)
Chloride: 94 mmol/L — ABNORMAL LOW (ref 101–111)
GFR, EST NON AFRICAN AMERICAN: 56 mL/min — AB (ref >60)
Glucose, Bld: 155 mg/dL — ABNORMAL HIGH (ref 65–99)
Potassium: 4.1 mmol/L (ref 3.5–5.1)
Sodium: 133 mmol/L — ABNORMAL LOW (ref 135–145)
TOTAL PROTEIN: 7.3 g/dL (ref 6.5–8.1)
Total Bilirubin: 0.8 mg/dL (ref 0.3–1.2)

## 2014-11-16 LAB — URINALYSIS, ROUTINE W REFLEX MICROSCOPIC
Bilirubin Urine: NEGATIVE
Glucose, UA: NEGATIVE mg/dL
Hgb urine dipstick: NEGATIVE
Ketones, ur: NEGATIVE mg/dL
Leukocytes, UA: NEGATIVE
NITRITE: NEGATIVE
PROTEIN: NEGATIVE mg/dL
SPECIFIC GRAVITY, URINE: 1.017 (ref 1.005–1.030)
UROBILINOGEN UA: 0.2 mg/dL (ref 0.0–1.0)
pH: 5.5 (ref 5.0–8.0)

## 2014-11-16 LAB — TROPONIN I

## 2014-11-16 MED ORDER — LABETALOL HCL 5 MG/ML IV SOLN
10.0000 mg | Freq: Once | INTRAVENOUS | Status: AC
  Administered 2014-11-16: 10 mg via INTRAVENOUS
  Filled 2014-11-16: qty 4

## 2014-11-16 NOTE — Progress Notes (Signed)
[address]       [place] [address]             [phone]       HPI:  Benjamin Patel returns today to further discuss management of his left apical lung nodule.  He has an 81 year old man with a past medical history significant for stage I non-small cell carcinoma resected with a left lower lobe superior segmentectomy in June 2014. His other medical problems include hypertension, hyperlipidemia, gout, carotid artery disease, type 2 diabetes with peripheral vascular complications, stage II chronic kidney disease, GOLD 3 COPD, coronary artery disease, and atrial fibrillation.  I saw him back in July for a 2 year follow-up visit. There was a questionable nodule in the left apex on CT. I recommended a 3 month interval follow-up. On the follow-up CT the nodule had increased in size.  He had a PET/CT and pulmonary function testing and now returns to discuss the results.  He developed a severe cough after I saw him in the office. Dr. Linna Darner treated him with a Z-Pak and Tessalon. His cough did improve after that.  Past Medical History  Diagnosis Date  . Mixed hyperlipidemia     takes Pravastatin daily  . Gout     no meds required  . Abnormal prostate exam   . Carotid stenosis   . Cholelithiasis   . Renal insufficiency   . Renal artery stenosis (HCC)     right  . PAC (premature atrial contraction)   . PVCs (premature ventricular contractions)   . Essential hypertension, benign     takes Clonodine,Losartan,and Metoprolol and HCTZ  . Carotid stenosis right side    Dr.Early does carotid ultrasounds yrly-last one Mar 14-report in epic  . Asthma     as a child  . COPD (chronic obstructive pulmonary disease) (HCC)     slight   . History of bronchitis   . Pneumonia 02/08/2012  . History of colon polyps   . Enlarged prostate     slightly   . Type II or unspecified type diabetes mellitus without mention of complication, not stated as uncontrolled     takes  Metformin daily  . History of shingles   . Skin cancer     Lung Ca- Left Lung  . Arthritis     Gout      Current Outpatient Prescriptions  Medication Sig Dispense Refill  . cloNIDine (CATAPRES) 0.2 MG tablet Take 1 tablet (0.2 mg total) by mouth 2 (two) times daily. 60 tablet 11  . gabapentin (NEURONTIN) 100 MG capsule Take 2 capsules up to 3 times a day 180 capsule 11  . hydrochlorothiazide (HYDRODIURIL) 25 MG tablet Take 1 tablet (25 mg total) by mouth daily. 30 tablet 11  . metoprolol succinate (TOPROL-XL) 100 MG 24 hr tablet Take 1 tablet (100 mg total) by mouth daily. Take with or immediately following a meal. 30 tablet 11  . pravastatin (PRAVACHOL) 20 MG tablet Take 1 tablet (20 mg total) by mouth at bedtime. 30 tablet 11   No current facility-administered medications for this visit.    Physical Exam BP 196/81 mmHg  Pulse 82  Resp 16  Ht '5\' 9"'$  (1.753 m)  Wt 177 lb (80.287 kg)  BMI 26.13 kg/m2  SpO50 53% 81 year old man in no acute distress Alert and oriented 3 with no focal neurologic deficit Lungs with diminished breath sounds bilaterally  Diagnostic Tests: I personally reviewed the PET/CT. I  concur with the findings as noted in the official report. The left apical nodule is markedly hypermetabolic with SUV of 24. There is also uptake in a right seventh rib with no correlating mass, this is most consistent with a fracture.  Point function testing  FVC 1.73 (49%) FEV1 0.73 (30%), 0.79 after bronchodilator DLCO 17%  Impression: Benjamin Patel is an 81 year old man with a history of lung cancer. He had a left lower lobe superior segmentectomy a little over 2 years ago for stage IA non-small cell carcinoma. He now has a new left upper lobe nodule that is markedly hypermetabolic by PET CT. This most likely is a metachronous second primary bronchogenic carcinoma. The PET CT also showed hypermetabolic activity in the right seventh rib. I suspect that is an occult rib fracture  due to coughing.  Benjamin Patel pulmonary function testing showed severe COPD which is not surprising. Given the severity of his pulmonary issues and his other comorbidities he is not a candidate for redo surgery.  I think the best option in his case would be to do radiation therapy, either conventional or stereotactic. This may have to be done without a biopsy given the location of the lesion. I am going to refer him to radiation oncology for an opinion.  The second issue today is his blood pressure which was markedly elevated at 196/81. We are going to repeat that before he leaves to make sure that it doesn't stay that high. If it does he'll need to go to the emergency room.  Plan:  Refer to radiation oncology for consideration of treatment  I spent 10 minutes face-to-face with Benjamin Patel during this visit, greater than 50% of the time was spent in counseling. [name], MD [facility] [phone]

## 2014-11-16 NOTE — ED Notes (Signed)
Phlebotomy at bedside at this time.

## 2014-11-16 NOTE — ED Notes (Signed)
Discharge instructions reviewed with patient/spouse. Understanding verbalized. No distress noted.

## 2014-11-16 NOTE — ED Notes (Signed)
PA at bedside at this time.  

## 2014-11-16 NOTE — ED Provider Notes (Signed)
CSN: [id number]     Arrival date & time 11/16/14  1725 History   First MD Initiated Contact with Patient 11/16/14 2031     Chief Complaint  Patient presents with  . Hypertension     (Consider location/radiation/quality/duration/timing/severity/associated sxs/prior Treatment) HPI   81 year old male with history of hypertension, renal artery stenosis, carotid stenosis, COPD, asthma and hearing by his surgeon for evaluation of hypertension. Patient report he follow-up with his surgeon today for a regular checkup after having a PET scan as evaluation for his lung cancer. He was found to be hypertensive in office without any specific symptoms however his surgeon recommend patient to come to ER for further evaluation. Patient states he has been taking his medication as prescribed and his blood pressure he is usually around 130/68 at home. Currently taking metoprolol and clonidine at nighttime. He has not had any fever, headache, vision changes, neck pain, chest pain, difficulty breathing, abdominal pain, dysuria, focal numbness or weakness. He denies any other medication changes. Denies any increased stress. Denies any diet change. States he is at his usual state of health.  Past Medical History  Diagnosis Date  . Mixed hyperlipidemia     takes Pravastatin daily  . Gout     no meds required  . Abnormal prostate exam   . Carotid stenosis   . Cholelithiasis   . Renal insufficiency   . Renal artery stenosis (HCC)     right  . PAC (premature atrial contraction)   . PVCs (premature ventricular contractions)   . Essential hypertension, benign     takes Clonodine,Losartan,and Metoprolol and HCTZ  . Carotid stenosis right side    Dr.Early does carotid ultrasounds yrly-last one Mar 14-report in epic  . Asthma     as a child  . COPD (chronic obstructive pulmonary disease) (HCC)     slight   . History of bronchitis   . Pneumonia 02/08/2012  . History of colon polyps   . Enlarged prostate    slightly   . Type II or unspecified type diabetes mellitus without mention of complication, not stated as uncontrolled     takes Metformin daily  . History of shingles   . Skin cancer     Lung Ca- Left Lung  . Arthritis     Gout    Past Surgical History  Procedure Laterality Date  . No past surgeries    . Colonoscopy    . Video assisted thoracoscopy Left 06/16/2012    Procedure: VIDEO ASSISTED THORACOSCOPY;  Surgeon: [name], MD;  Location: [facility];  Service: Thoracic;  Laterality: Left;  . Segmentecomy Left 06/16/2012    Procedure: left lower lobe superior SEGMENTECTOMY with node dissection;  Surgeon: [name], MD;  Location: [facility];  Service: Thoracic;  Laterality: Left;  left superior   Family History  Problem Relation Age of Onset  . Heart disease Mother   . Diabetes Mother     AKA  Right   . Hypertension Mother   . Varicose Veins Mother   . Kidney disease Mother     One Kidney  . Cancer Father     Lung   Social History  Substance Use Topics  . Smoking status: Former Smoker -- 1.50 packs/day for 30 years    Types: Cigarettes    Quit date: 01/16/1976  . Smokeless tobacco: Never Used  . Alcohol Use: No    Review of Systems  All other systems reviewed and are negative.  Allergies  Ace inhibitors; Codeine; Indomethacin; and Losartan  Home Medications   Prior to Admission medications   Medication Sig Start Date End Date Taking? Authorizing Provider  cloNIDine (CATAPRES) 0.2 MG tablet Take 1 tablet (0.2 mg total) by mouth 2 (two) times daily. 08/17/14   Darlyne Russian, MD  gabapentin (NEURONTIN) 100 MG capsule Take 2 capsules up to 3 times a day 08/17/14   Darlyne Russian, MD  hydrochlorothiazide (HYDRODIURIL) 25 MG tablet Take 1 tablet (25 mg total) by mouth daily. 08/17/14   Darlyne Russian, MD  metoprolol succinate (TOPROL-XL) 100 MG 24 hr tablet Take 1 tablet (100 mg total) by mouth daily. Take with or immediately following a meal. 08/17/14   Darlyne Russian, MD  pravastatin (PRAVACHOL) 20 MG tablet Take 1 tablet (20 mg total) by mouth at bedtime. 08/17/14   Darlyne Russian, MD   BP 189/85 mmHg  Pulse 98  Temp(Src) 98 F (36.7 C) (Oral)  SpO2 97% Physical Exam  Constitutional: He is oriented to person, place, and time. He appears well-developed and well-nourished. No distress.  Well-appearing Caucasian elderly male in no acute discomfort.  HENT:  Head: Normocephalic and atraumatic.  Eyes: Conjunctivae and EOM are normal. Pupils are equal, round, and reactive to light.  Neck: Neck supple. No JVD present.  No carotid bruit  Cardiovascular:  Mildly tachycardic, no murmurs rubs or gallops  Pulmonary/Chest: Effort normal and breath sounds normal.  Abdominal: Soft. There is no tenderness.  No abdominal bruit noted.  Musculoskeletal: He exhibits no edema.  Neurological: He is alert and oriented to person, place, and time. He has normal strength. No cranial nerve deficit or sensory deficit. GCS eye subscore is 4. GCS verbal subscore is 5. GCS motor subscore is 6.  Skin: No rash noted.  Psychiatric: He has a normal mood and affect.  Nursing note and vitals reviewed.   ED Course  Procedures (including critical care time)  Elderly male here for a symptomatic hypertension. Blood pressure is currently 322 systolic. Will check basic labs, start patient on a dose of labetalol and will monitor closely. Since patient has no symptoms, I have low suspicion for hypertensive emergency causing an organ damage. Care discussed with Dr. Lita Mains.  10:23 PM Labs are reassuring, blood pressure has improved after receiving 10 mg of labetalol IV. Patient is stable for discharge. He will take his nightly medication. He will follow-up probably with his primary care provider for further evaluation. Strict return precautions discussed.  Labs Review Labs Reviewed  COMPREHENSIVE METABOLIC PANEL - Abnormal; Notable for the following:    Sodium 133 (*)    Chloride  94 (*)    Glucose, Bld 155 (*)    BUN 21 (*)    ALT 15 (*)    GFR calc non Af Amer 56 (*)    All other components within normal limits  CBC WITH DIFFERENTIAL/PLATELET  TROPONIN I  URINALYSIS, ROUTINE W REFLEX MICROSCOPIC (NOT AT [facility])    Imaging Review No results found. I have personally reviewed and evaluated these images and lab results as part of my medical decision-making.   EKG Interpretation None     ED ECG REPORT   Date: 11/16/2014  Rate: 96  Rhythm: sinus tachycardia  QRS Axis: normal  Intervals: normal  ST/T Wave abnormalities: nonspecific ST changes  Conduction Disutrbances:none  Narrative Interpretation: Multiple PVC, short R-R interval.  Old EKG Reviewed: unchanged  I have personally reviewed the EKG tracing and agree  with the computerized printout as noted.   MDM   Final diagnoses:  Essential hypertension    BP 177/79 mmHg  Pulse 89  Temp(Src) 98 F (36.7 C) (Oral)  Resp 20  SpO2 96%     Domenic Moras, PA-C 11/16/14 2224  Julianne Rice, MD 11/16/14 2249

## 2014-11-16 NOTE — ED Notes (Addendum)
Pt sent here by PCP for HTN. His BP at PCP's office was 190/80. Pt denies HA, weakness, blurry vision. Pt A&OX4, ambulatory, NAD. Pt takes Metoprolol and Clonidine and usual BP is around 130/68.

## 2014-11-16 NOTE — Discharge Instructions (Signed)
Hypertension Hypertension, commonly called high blood pressure, is when the force of blood pumping through your arteries is too strong. Your arteries are the blood vessels that carry blood from your heart throughout your body. A blood pressure reading consists of a higher number over a lower number, such as 110/72. The higher number (systolic) is the pressure inside your arteries when your heart pumps. The lower number (diastolic) is the pressure inside your arteries when your heart relaxes. Ideally you want your blood pressure below 120/80. Hypertension forces your heart to work harder to pump blood. Your arteries may become narrow or stiff. Having untreated or uncontrolled hypertension can cause heart attack, stroke, kidney disease, and other problems. RISK FACTORS Some risk factors for high blood pressure are controllable. Others are not.  Risk factors you cannot control include:   Race. You may be at higher risk if you are African American.  Age. Risk increases with age.  Gender. Men are at higher risk than women before age 45 years. After age 65, women are at higher risk than men. Risk factors you can control include:  Not getting enough exercise or physical activity.  Being overweight.  Getting too much fat, sugar, calories, or salt in your diet.  Drinking too much alcohol. SIGNS AND SYMPTOMS Hypertension does not usually cause signs or symptoms. Extremely high blood pressure (hypertensive crisis) may cause headache, anxiety, shortness of breath, and nosebleed. DIAGNOSIS To check if you have hypertension, your health care provider will measure your blood pressure while you are seated, with your arm held at the level of your heart. It should be measured at least twice using the same arm. Certain conditions can cause a difference in blood pressure between your right and left arms. A blood pressure reading that is higher than normal on one occasion does not mean that you need treatment. If  it is not clear whether you have high blood pressure, you may be asked to return on a different day to have your blood pressure checked again. Or, you may be asked to monitor your blood pressure at home for 1 or more weeks. TREATMENT Treating high blood pressure includes making lifestyle changes and possibly taking medicine. Living a healthy lifestyle can help lower high blood pressure. You may need to change some of your habits. Lifestyle changes may include:  Following the DASH diet. This diet is high in fruits, vegetables, and whole grains. It is low in salt, red meat, and added sugars.  Keep your sodium intake below 2,300 mg per day.  Getting at least 30-45 minutes of aerobic exercise at least 4 times per week.  Losing weight if necessary.  Not smoking.  Limiting alcoholic beverages.  Learning ways to reduce stress. Your health care provider may prescribe medicine if lifestyle changes are not enough to get your blood pressure under control, and if one of the following is true:  You are 18-59 years of age and your systolic blood pressure is above 140.  You are 60 years of age or older, and your systolic blood pressure is above 150.  Your diastolic blood pressure is above 90.  You have diabetes, and your systolic blood pressure is over 140 or your diastolic blood pressure is over 90.  You have kidney disease and your blood pressure is above 140/90.  You have heart disease and your blood pressure is above 140/90. Your personal target blood pressure may vary depending on your medical conditions, your age, and other factors. HOME CARE INSTRUCTIONS    Have your blood pressure rechecked as directed by your health care provider.   Take medicines only as directed by your health care provider. Follow the directions carefully. Blood pressure medicines must be taken as prescribed. The medicine does not work as well when you skip doses. Skipping doses also puts you at risk for  problems.  Do not smoke.   Monitor your blood pressure at home as directed by your health care provider. SEEK MEDICAL CARE IF:   You think you are having a reaction to medicines taken.  You have recurrent headaches or feel dizzy.  You have swelling in your ankles.  You have trouble with your vision. SEEK IMMEDIATE MEDICAL CARE IF:  You develop a severe headache or confusion.  You have unusual weakness, numbness, or feel faint.  You have severe chest or abdominal pain.  You vomit repeatedly.  You have trouble breathing. MAKE SURE YOU:   Understand these instructions.  Will watch your condition.  Will get help right away if you are not doing well or get worse.   This information is not intended to replace advice given to you by your health care provider. Make sure you discuss any questions you have with your health care provider.   Document Released: 01/01/2005 Document Revised: 05/18/2014 Document Reviewed: 10/24/2012 Elsevier Interactive Patient Education 2016 [employer]  

## 2014-11-17 ENCOUNTER — Telehealth: Payer: Self-pay | Admitting: *Deleted

## 2014-11-17 NOTE — Telephone Encounter (Signed)
Oncology Nurse Navigator Documentation  Oncology Nurse Navigator Flowsheets 11/17/2014  Referral date to RadOnc/MedOnc 11/16/2014  Navigator Encounter Type Telephone;Introductory phone call/I received a referral for [name] on 11/16/14.  I called today and set him up to see Rad Onc on 11/25/14 arrive at 2:15.  Patient's wife verbalized understanding of appt time and place.   Patient Visit Type Initial  Treatment Phase Abnormal Scans  Interventions Coordination of Care  Coordination of Care MD Appointments  Time Spent with Patient 15

## 2014-11-18 ENCOUNTER — Ambulatory Visit: Payer: Medicare Other | Admitting: Radiation Oncology

## 2014-11-24 ENCOUNTER — Ambulatory Visit (INDEPENDENT_AMBULATORY_CARE_PROVIDER_SITE_OTHER): Payer: Medicare Other | Admitting: Emergency Medicine

## 2014-11-24 VITALS — BP 132/64 | HR 90 | Temp 97.6°F | Resp 16 | Ht 69.0 in | Wt 176.0 lb

## 2014-11-24 DIAGNOSIS — I6523 Occlusion and stenosis of bilateral carotid arteries: Secondary | ICD-10-CM | POA: Diagnosis not present

## 2014-11-24 DIAGNOSIS — C3412 Malignant neoplasm of upper lobe, left bronchus or lung: Secondary | ICD-10-CM

## 2014-11-24 NOTE — Progress Notes (Signed)
Patient ID: Benjamin Patel, male   DOB: [birth year], 81 y.o.   MRN: [id number]    By signing my name below, I, Benjamin Patel, attest that this documentation has been prepared under the direction and in the presence of Benjamin Russian, MD Electronically Signed: Ladene Artist, ED Scribe 11/24/2014 at 8:50 AM.  Chief Complaint:  Chief Complaint  Patient presents with  . Follow-up    BP follow up, pt wants provider to look at the work he had done in the ER.   HPI: Benjamin Patel is a 81 y.o. male, with a ho HTN and CA, who reports to [facility] today for a follow-up regarding regarding HTN. Pt states that he was referred to the ED following intermittent elevated BP int he past 4-5 weeks. He reports systolic readings ranging from 180-190. He denies elevated BP in the past 6 days. Triage BP: 132/64. He also denies any dietary changes or changes in activity.   PET Scan Pt had a PET scan done on 11/10/14 which showed a questionable early invasion of the left first rib and a mild hypermetabolic activity associated with a subacute fracture of the right anterior seventh rib. Pt denies any trauma or rib pain. He has an appointment with oncologist Dr. Sondra Come at 2 PM tomorrow. Pt states that his spirits are high and his wife is handling the news well.   Immunizations  Pt has had his pneumonia and flu vaccines.   Pt is married with 3 children, grandchildren and great grandchildren.   Past Medical History  Diagnosis Date  . Mixed hyperlipidemia     takes Pravastatin daily  . Gout     no meds required  . Abnormal prostate exam   . Carotid stenosis   . Cholelithiasis   . Renal insufficiency   . Renal artery stenosis (HCC)     right  . PAC (premature atrial contraction)   . PVCs (premature ventricular contractions)   . Essential hypertension, benign     takes Clonodine,Losartan,and Metoprolol and HCTZ  . Carotid stenosis right side    Dr.Early does carotid ultrasounds yrly-last one Mar 14-report in epic   . Asthma     as a child  . COPD (chronic obstructive pulmonary disease) (HCC)     slight   . History of bronchitis   . Pneumonia 02/08/2012  . History of colon polyps   . Enlarged prostate     slightly   . Type II or unspecified type diabetes mellitus without mention of complication, not stated as uncontrolled     takes Metformin daily  . History of shingles   . Skin cancer     Lung Ca- Left Lung  . Arthritis     Gout    Past Surgical History  Procedure Laterality Date  . No past surgeries    . Colonoscopy    . Video assisted thoracoscopy Left 06/16/2012    Procedure: VIDEO ASSISTED THORACOSCOPY;  Surgeon: [name], MD;  Location: [place];  Service: Thoracic;  Laterality: Left;  . Segmentecomy Left 06/16/2012    Procedure: left lower lobe superior SEGMENTECTOMY with node dissection;  Surgeon: [name], MD;  Location: [place];  Service: Thoracic;  Laterality: Left;  left superior   Social History   Social History  . Marital Status: Married    Spouse Name: N/A  . Number of Children: 3  . Years of Education: N/A   Occupational History  . retired  insurance sales   Social History Main Topics  . Smoking status: Former Smoker -- 1.50 packs/day for 30 years    Types: Cigarettes    Quit date: 01/16/1976  . Smokeless tobacco: Never Used  . Alcohol Use: No  . Drug Use: No  . Sexual Activity: Not Asked   Other Topics Concern  . None   Social History Narrative   Family History  Problem Relation Age of Onset  . Heart disease Mother   . Diabetes Mother     AKA  Right   . Hypertension Mother   . Varicose Veins Mother   . Kidney disease Mother     One Kidney  . Cancer Father     Lung   Allergies  Allergen Reactions  . Ace Inhibitors Cough  . Codeine Other (See Comments)    GI Upset  . Indomethacin Other (See Comments)    Elevated BP  . Losartan Swelling    ANGIOEDEMA/    Prior to Admission medications   Medication Sig Start Date End Date  Taking? Authorizing Provider  cloNIDine (CATAPRES) 0.2 MG tablet Take 1 tablet (0.2 mg total) by mouth 2 (two) times daily. 08/17/14  Yes Benjamin Russian, MD  Flaxseed, Linseed, (FLAXSEED OIL PO) Take 1 tablet by mouth daily.   Yes Historical Provider, MD  gabapentin (NEURONTIN) 100 MG capsule Take 2 capsules up to 3 times a day 08/17/14  Yes Benjamin Russian, MD  hydrochlorothiazide (HYDRODIURIL) 25 MG tablet Take 1 tablet (25 mg total) by mouth daily. 08/17/14  Yes Benjamin Russian, MD  metoprolol succinate (TOPROL-XL) 100 MG 24 hr tablet Take 1 tablet (100 mg total) by mouth daily. Take with or immediately following a meal. 08/17/14  Yes Benjamin Russian, MD  pravastatin (PRAVACHOL) 20 MG tablet Take 1 tablet (20 mg total) by mouth at bedtime. 08/17/14  Yes Benjamin Russian, MD   ROS: The patient denies fevers, chills, night sweats, unintentional weight loss, chest pain, rib pain, palpitations, wheezing, dyspnea on exertion, nausea, vomiting, abdominal pain, dysuria, hematuria, melena, numbness, weakness, or tingling.   All other systems have been reviewed and were otherwise negative with the exception of those mentioned in the HPI and as above.    PHYSICAL EXAM: Filed Vitals:   11/24/14 0819  BP: 132/64  Pulse: 90  Temp: 97.6 F (36.4 C)  Resp: 16   Body mass index is 25.98 kg/(m^2).   General: Alert, no acute distress HEENT:  Normocephalic, atraumatic, oropharynx patent. Eye: [name] Cardiovascular:  Regular rate and rhythm, no rubs murmurs or gallops.  No Carotid bruits, radial pulse intact. No pedal edema.  Respiratory: Clear to auscultation bilaterally.  No wheezes, rales, or rhonchi.  No cyanosis, no use of accessory musculature Abdominal: No organomegaly, abdomen is soft and non-tender, positive bowel sounds.  No masses. Musculoskeletal: Gait intact. No edema, tenderness Skin: No rashes. Neurologic: Facial musculature symmetric. Psychiatric: Patient acts appropriately throughout our  interaction. Lymphatic: No cervical or submandibular lymphadenopathy  LABS:  EKG/XRAY:   Primary read interpreted by [facility]. Everlene Farrier at [facility].   ASSESSMENT/PLAN: Patient to proceed with probable radiation therapy to suspected left upper lobe cancer. There is also a suspicious area in the right rib which may be a healing rib fracture. No change in treatment at the present time.I personally performed the services described in this documentation, which was scribed in my presence. The recorded information has been reviewed and is accurate.    Gross sideeffects, risk  and benefits, and alternatives of medications d/w patient. Patient is aware that all medications have potential sideeffects and we are unable to predict every sideeffect or drug-drug interaction that may occur.  Arlyss Queen MD 11/24/2014 8:33 AM

## 2014-11-25 ENCOUNTER — Ambulatory Visit: Payer: Medicare Other | Admitting: Radiation Oncology

## 2014-11-25 ENCOUNTER — Ambulatory Visit: Payer: Medicare Other | Attending: Radiation Oncology | Admitting: Radiation Oncology

## 2014-11-25 ENCOUNTER — Ambulatory Visit: Payer: Medicare Other | Attending: Radiation Oncology | Admitting: Physical Therapy

## 2014-11-25 ENCOUNTER — Ambulatory Visit
Admission: RE | Admit: 2014-11-25 | Discharge: 2014-11-25 | Disposition: A | Discharge status: Home / Self Care | Payer: Medicare Other | Source: Ambulatory Visit | Attending: Radiation Oncology | Admitting: Radiation Oncology

## 2014-11-25 ENCOUNTER — Ambulatory Visit: Admission: RE | Admit: 2014-11-25 | Payer: Medicare Other | Source: Ambulatory Visit | Admitting: Radiation Oncology

## 2014-11-25 VITALS — BP 179/71 | HR 83 | Temp 97.5°F | Resp 18 | Wt 176.1 lb

## 2014-11-25 DIAGNOSIS — M2560 Stiffness of unspecified joint, not elsewhere classified: Secondary | ICD-10-CM | POA: Diagnosis not present

## 2014-11-25 DIAGNOSIS — M5386 Other specified dorsopathies, lumbar region: Secondary | ICD-10-CM

## 2014-11-25 DIAGNOSIS — R918 Other nonspecific abnormal finding of lung field: Secondary | ICD-10-CM | POA: Diagnosis not present

## 2014-11-25 DIAGNOSIS — R0602 Shortness of breath: Secondary | ICD-10-CM | POA: Diagnosis not present

## 2014-11-25 DIAGNOSIS — M4004 Postural kyphosis, thoracic region: Secondary | ICD-10-CM | POA: Insufficient documentation

## 2014-11-25 NOTE — Therapy (Signed)
[place], [place], [address] Phone: [phone]   Fax:  [phone]  Physical Therapy Evaluation  Patient Details  Name: Benjamin Patel MRN: [id number] Date of Birth: Aug 14, 1927 Referring Provider: Dr. Gery Pray  Encounter Date: 11/25/2014      PT End of Session - 11/25/14 1812    Visit Number 1   Number of Visits 1   PT Start Time 1610   PT Stop Time 1630   PT Time Calculation (min) 20 min   Activity Tolerance Patient tolerated treatment well   Behavior During Therapy [facility] for tasks assessed/performed      Past Medical History  Diagnosis Date  . Mixed hyperlipidemia     takes Pravastatin daily  . Gout     no meds required  . Abnormal prostate exam   . Carotid stenosis   . Cholelithiasis   . Renal insufficiency   . Renal artery stenosis (HCC)     right  . PAC (premature atrial contraction)   . PVCs (premature ventricular contractions)   . Essential hypertension, benign     takes Clonodine,Losartan,and Metoprolol and HCTZ  . Carotid stenosis right side    Dr.Early does carotid ultrasounds yrly-last one Mar 14-report in epic  . Asthma     as a child  . COPD (chronic obstructive pulmonary disease) (HCC)     slight   . History of bronchitis   . Pneumonia 02/08/2012  . History of colon polyps   . Enlarged prostate     slightly   . Type II or unspecified type diabetes mellitus without mention of complication, not stated as uncontrolled     takes Metformin daily  . History of shingles   . Skin cancer     Lung Ca- Left Lung  . Arthritis     Gout     Past Surgical History  Procedure Laterality Date  . No past surgeries    . Colonoscopy    . Video assisted thoracoscopy Left 06/16/2012    Procedure: VIDEO ASSISTED THORACOSCOPY;  Surgeon: [name], MD;  Location: [place];  Service: Thoracic;  Laterality: Left;  . Segmentecomy Left 06/16/2012    Procedure: left lower lobe  superior SEGMENTECTOMY with node dissection;  Surgeon: [name], MD;  Location: [place];  Service: Thoracic;  Laterality: Left;  left superior    There were no vitals filed for this visit.  Visit Diagnosis:  Postural kyphosis of thoracic region - Plan: PT plan of care cert/re-cert  Shortness of breath on exertion - Plan: PT plan of care cert/re-cert  Decreased ROM of lumbar spine - Plan: PT plan of care cert/re-cert      Subjective Assessment - 11/25/14 1800    Subjective reports some shortness of breath   Patient is accompained by: Family member  wife   Pertinent History Patient was being followed due to h/o non-small cell lung cancer, stage I, s/p left lower lobe segmentectomy 06/2009; now with probable recurrence.  Pt. is not an operative candidate for this and is expected to have XRT.  Ex-smoker (45 pack-years and quit 1978); emphysema, PVD, HTN, kidney problems, type II diabetes.   Patient Stated Goals get information from all lung clinic providers   Currently in Pain? 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 Gery Pray   Onset Date/Surgical Date 11/02/14   Precautions   Precautions Other (comment)   Precaution Comments cancer precautions   Restrictions   Weight Bearing Restrictions No   Balance Screen   Has the patient fallen in the past 6 months No   Has the patient had a decrease in activity level because of a fear of falling?  No   Is the patient reluctant to leave their home because of a fear of falling?  No   Home Environment   Living Environment Private residence   Living Arrangements Spouse/significant other   Type of [place] Layout Multi-level   Alternate Level Stairs-Rails --  yes   Prior Function   Level of Independence Independent   Leisure no regular exercise   Cognition   Overall  Cognitive Status Within Functional Limits for tasks assessed   Observation/Other Assessments   Observations well looking 81 year-old   Functional Tests   Functional tests Sit to Stand   Sit to Stand   Comments 10 times in 30 seconds  average for age   Posture/Postural Control   Posture/Postural Control Postural limitations   Postural Limitations Increased thoracic kyphosis  significant   ROM / Strength   AROM / PROM / Strength AROM   AROM   Overall AROM Comments Trunk AROM in standing: flexion--reaches fingertips 8 inches to floor; extension 25% loss; sidebend WFL bilat.; rotation 15% loss bilat.   Ambulation/Gait   Ambulation/Gait Yes   Ambulation/Gait Assistance 7: Independent  but reports some SOB   Balance   Balance Assessed Yes   Dynamic Standing Balance   Dynamic Standing - Comments reaches 9 inches forward in standing                           PT Education - 11/25/14 1811    Education provided Yes   Education Details energy conservation, walking, posture, breathing, Cure article on staying active through treatment, PT info   Person(s) Educated Patient;Spouse   Methods Explanation;Handout   Comprehension Verbalized understanding               Lung Clinic Goals - 11/25/14 1816    Patient will be able to verbalize understanding of the benefit of exercise to decrease fatigue.   Status Achieved   Patient will be able to verbalize the importance of posture.   Status Achieved   Patient will be able to demonstrate diaphragmatic breathing for improved lung function.   Status Achieved   Patient will be able to verbalize understanding of the role of physical therapy to prevent functional decline and who to contact if physical therapy is needed.   Status Achieved             Plan - 11/25/14 1812    Clinical Impression Statement This is a very pleasant, polite older gentleman with a probable recurrence of a 2011 squamous cell carcinoma of his  left lower lobe.  He has a significant thoracic kyphosis with trunk ROM limitations; he does not do regular exercise and reports some shortness of breath.    Pt will benefit from skilled therapeutic intervention in order to improve on the following deficits Cardiopulmonary status limiting activity   Rehab Potential Good   PT Frequency One time visit   PT Treatment/Interventions Patient/family education   PT Next Visit Plan None at this time; patient has been encouraged to  engage in a regular exercise program.   PT Home Exercise Plan see education section   Consulted and Agree with Plan of Care Patient          G-Codes - 12/12/2014 1816    Functional Assessment Tool Used clinical judgement   Functional Limitation Changing and maintaining body position   Changing and Maintaining Body Position Current Status (S2876) At least 1 percent but less than 20 percent impaired, limited or restricted   Changing and Maintaining Body Position Goal Status (O1157) At least 1 percent but less than 20 percent impaired, limited or restricted   Changing and Maintaining Body Position Discharge Status (W6203) At least 1 percent but less than 20 percent impaired, limited or restricted       Problem List Patient Active Problem List   Diagnosis Date Noted  . Lung mass 02/02/2014  . Diabetes ([place]) 02/10/2013  . Lung cancer ([place]) 02/10/2013  . Carotid arterial disease ([place]) 02/10/2013  . Coronary artery calcification 12/22/2012  . Atrial fibrillation ([place]) 06/23/2012  . CKD (chronic kidney disease) stage 2, GFR 60-89 ml/min 06/19/2012  . COPD/ GOLD III 06/17/2012  . DM (diabetes mellitus) type II controlled with renal manifestation ([place]) 06/17/2012  . Occlusion and stenosis of carotid artery without mention of cerebral infarction 03/20/2012  . Non-small cell carcinoma of lung, stage 1 ([place]) 02/10/2012  . Essential hypertension, benign   . Mixed hyperlipidemia   . Gout   . DM (diabetes mellitus), type 2  with peripheral vascular complications ([place])   . Carotid stenosis   . Cholelithiasis   . Renal insufficiency   . Renal artery stenosis ([place])     Elizabeth 12-12-14, 6:19 PM  [place], [place], [address] Phone: [phone]   Fax:  [phone]  Name: Benjamin Patel MRN: [id number] Date of Birth: [birth year]   Serafina Royals, PT Dec 12, 2014 6:19 PM

## 2014-11-25 NOTE — Progress Notes (Signed)
Radiation Oncology         [phone] ________________________________  Initial Outpatient Consultation  Name: Benjamin Patel MRN: [id number]  Date: 11/25/2014  DOB: [birth year]  RK:YHCW, Benjamin Sayre, MD  [name], *   REFERRING PHYSICIAN: [name], *  DIAGNOSIS: Solitary PET Positive Nodule in the Left Upper Lung  HISTORY OF PRESENT ILLNESS::Benjamin Patel is a 81 y.o. male who is seen out of the courtesy of Dr. Roxan Hockey for an opinion concerning radiation therapy. The patient has a history of a left lower lobe superior segmentectomy in June 2014 for stage IA non-small cell carcinoma. He was being followed for this by his PCP and Dr. Roxan Hockey. The pt had a CT of the chest on 07/31/14 showed an increase of nodular pleural thickening at the apex of the left lung. A f/u chest CT on 11/02/14 found further enlargement of the spiculated left apical nodule that was worrisome for metachronous lung cancer. He then had a PET scan on 11/10/14 showed a hypermetabolic left anterior apical pulmonary nodule with a SUV of 24 with questionable early invasion of the left first rib. There was also hypermetabolic activity associated with a subacute fracture of the right anterior seventh rib. This has occurred between the two prior chest CT scans. It is believed to be a benign incidental rib fracture and the metabolic activity being associated with the healing process. He presents to multidisciplinary thoracic oncology clinic for the management of his disease. He presents to the clinic with his wife. Given the location of the lesion and the patient's lung status and would be extremely risky to consider biopsy with risk for pneumothorax or hemorrhage. This issue was discussed in the conference earlier today with interventional radiology present.  PREVIOUS RADIATION THERAPY: No  PAST MEDICAL HISTORY:  has a past medical history of Mixed hyperlipidemia; Gout; Abnormal prostate exam; Carotid  stenosis; Cholelithiasis; Renal insufficiency; Renal artery stenosis ([place]); PAC (premature atrial contraction); PVCs (premature ventricular contractions); Essential hypertension, benign; Carotid stenosis (right side); Asthma; COPD (chronic obstructive pulmonary disease) ([place]); History of bronchitis; Pneumonia (02/08/2012); History of colon polyps; Enlarged prostate; Type II or unspecified type diabetes mellitus without mention of complication, not stated as uncontrolled; History of shingles; Skin cancer; and Arthritis.    PAST SURGICAL HISTORY: Past Surgical History  Procedure Laterality Date  . No past surgeries    . Colonoscopy    . Video assisted thoracoscopy Left 06/16/2012    Procedure: VIDEO ASSISTED THORACOSCOPY;  Surgeon: [name], MD;  Location: [facility];  Service: Thoracic;  Laterality: Left;  . Segmentecomy Left 06/16/2012    Procedure: left lower lobe superior SEGMENTECTOMY with node dissection;  Surgeon: [name], MD;  Location: [facility];  Service: Thoracic;  Laterality: Left;  left superior    FAMILY HISTORY: family history includes Cancer in his father; Diabetes in his mother; Heart disease in his mother; Hypertension in his mother; Kidney disease in his mother; Varicose Veins in his mother.  SOCIAL HISTORY:  reports that he quit smoking about 38 years ago. His smoking use included Cigarettes. He has a 45 pack-year smoking history. He has never used smokeless tobacco. He reports that he does not drink alcohol or use illicit drugs.  ALLERGIES: Ace inhibitors; Codeine; Indomethacin; and Losartan  MEDICATIONS:  Current Outpatient Prescriptions  Medication Sig Dispense Refill  . cloNIDine (CATAPRES) 0.2 MG tablet Take 1 tablet (0.2 mg total) by mouth 2 (two) times daily. 60 tablet 11  .  Flaxseed, Linseed, (FLAXSEED OIL PO) Take 1 tablet by mouth daily.    Marland Kitchen gabapentin (NEURONTIN) 100 MG capsule Take 2 capsules up to 3 times a day 180 capsule 11  . hydrochlorothiazide  (HYDRODIURIL) 25 MG tablet Take 1 tablet (25 mg total) by mouth daily. 30 tablet 11  . metoprolol succinate (TOPROL-XL) 100 MG 24 hr tablet Take 1 tablet (100 mg total) by mouth daily. Take with or immediately following a meal. 30 tablet 11  . pravastatin (PRAVACHOL) 20 MG tablet Take 1 tablet (20 mg total) by mouth at bedtime. 30 tablet 11   No current facility-administered medications for this encounter.    REVIEW OF SYSTEMS:  A 15 point review of systems is documented in the electronic medical record. This was obtained by the nursing staff. However, I reviewed this with the patient to discuss relevant findings and make appropriate changes.  Pertinent items are noted in HPI. he denies any pain within the chest area cough or shortness of breath. Patient denies any new bony pain headaches dizziness or blurred vision.  He denies hemoptysis, chest pain, or the use of oxygen.   PHYSICAL EXAM:  weight is 176 lb 1.6 oz (79.878 kg). His temperature is 97.5 F (36.4 C). His blood pressure is 179/71 and his pulse is 83. His respiration is 18 and oxygen saturation is 94%.  General: Alert and oriented, in no acute distress HEENT: Head is normocephalic. Extraocular movements are intact. Oropharynx is clear. The patient is edentulous. Neck: Neck is supple, no palpable cervical or supraclavicular lymphadenopathy. Heart: Regular in rate and rhythm with no murmurs, rubs, or gallops. Chest: Clear to auscultation bilaterally, with no rhonchi, wheezes, or rales. Abdomen: Soft, nontender, nondistended, with no rigidity or guarding. Extremities: No cyanosis or edema. Lymphatics: see Neck Exam Musculoskeletal: symmetric strength and muscle tone throughout. Neurologic: Cranial nerves II through XII are grossly intact. No obvious focalities. Speech is fluent. Coordination is intact. Psychiatric: Judgment and insight are intact. Affect is appropriate.  ECOG = 0  LABORATORY DATA:  Lab Results  Component Value  Date   WBC 9.5 11/16/2014   HGB 14.8 11/16/2014   HCT 43.8 11/16/2014   MCV 89.0 11/16/2014   PLT 219 11/16/2014   NEUTROABS 6.4 11/16/2014   Lab Results  Component Value Date   NA 133* 11/16/2014   K 4.1 11/16/2014   CL 94* 11/16/2014   CO2 28 11/16/2014   GLUCOSE 155* 11/16/2014   CREATININE 1.15 11/16/2014   CALCIUM 9.8 11/16/2014      RADIOGRAPHY: Ct Chest Wo Contrast  11/02/2014  CLINICAL DATA:  History of left lung cancer status post left lower lobe resection 06/16/2012. Subsequent encounter. EXAM: CT CHEST WITHOUT CONTRAST TECHNIQUE: Multidetector CT imaging of the chest was performed following the standard protocol without IV contrast. COMPARISON:  Chest CTs ranging from 10/21/2012 through 08/10/2014. FINDINGS: Mediastinum/Nodes: There are no enlarged mediastinal, hilar or axillary lymph nodes. The thyroid gland, trachea and esophagus demonstrate no significant findings. The heart size is normal. There is no pericardial effusion. Extensive atherosclerosis of the aorta, great vessels and coronary arteries again noted. Lungs/Pleura: There is no pleural effusion. There is stable volume loss in the left hemithorax status post left lower lobe wedge resection. The subpleural nodule anteriorly at the left apex demonstrates progressive enlargement, measuring 10 x 15 mm on image 9. This nodule is spiculated and remains highly worrisome for metachronous lung cancer. No other suspicious pulmonary nodules are identified. There is no evidence of recurrence  along the suture line. Moderate emphysematous changes are noted. Upper abdomen: The visualized upper abdomen appears stable without acute findings. Gallstones and a nonobstructing left renal calculus are noted. Musculoskeletal/Chest wall: There is no chest wall mass or suspicious osseous finding. IMPRESSION: 1. Further enlargement of spiculated left apical nodule, worrisome for metachronous lung cancer. Further evaluation with PET-CT recommended.  2. No evidence of local recurrence or metastatic disease. 3. Stable emphysema, atherosclerosis and incidental findings in the upper abdomen. Electronically Signed   By: Richardean Sale M.D.   On: 11/02/2014 08:22   Nm Pet Image Initial (pi) Skull Base To Thigh  11/10/2014  CLINICAL DATA:  Initial treatment strategy for left apical spiculated pulmonary nodule. History of non-small cell carcinoma of the left lung with prior left lung surgery in 2014. EXAM: NUCLEAR MEDICINE PET SKULL BASE TO THIGH TECHNIQUE: 8.7 mCi F-18 FDG was injected intravenously. Full-ring PET imaging was performed from the skull base to thigh after the radiotracer. CT data was obtained and used for attenuation correction and anatomic localization. FASTING BLOOD GLUCOSE:  Value: 128 mg/dl COMPARISON:  05/08/2012 FINDINGS: NECK No hypermetabolic lymph nodes in the neck. Subtle increased activity associated with degenerative facet arthropathy in the neck. Atherosclerotic calcification of the carotid bulbs. CHEST The left apical anterior pulmonary nodule has a maximum standard uptake value of 24.0. Questionable early invasion of the left first rib adjacent to this lesion. Subacute fracture the right anterior seventh rib, associated hypermetabolic focal activity with maximum standard uptake value 7.9. No hypermetabolic adenopathy in the chest. Coronary atherosclerosis. Aortic and branch vessel atherosclerosis. Severe emphysema. ABDOMEN/PELVIS No abnormal hypermetabolic activity within the liver, pancreas, adrenal glands, or spleen. No hypermetabolic lymph nodes in the abdomen or pelvis. Cholelithiasis. Aortoiliac atherosclerotic vascular disease. Sigmoid diverticulosis. Prominent prostate gland. Nonobstructive left nephrolithiasis. Right renal atrophy. Bladder cellules. SKELETON No focal hypermetabolic activity to suggest skeletal metastasis. IMPRESSION: 1. Hypermetabolic left anterior apical pulmonary nodule, maximum standard uptake value 24.0,  compatible with malignancy. Questionable early invasion of the left first rib, although this is not certain. No adenopathy identified. 2. Mild hypermetabolic activity associated with a subacute fracture the right anterior seventh rib. This appears to have occurred at some time between the 08/10/2014 and 11/02/2014 chest CTs. On 11/10/2014, I do not see a definite underlying lesion to indicate that this is a pathologic fracture, and accordingly this probably represents a benign incidental rib fracture with hypermetabolic activity related to the healing process. Correlate with history of trauma. 3. Other imaging findings of potential clinical significance: Scattered atherosclerosis. Severe emphysema. Sigmoid diverticulosis. Cholelithiasis. Nonobstructive left nephrolithiasis. Right renal atrophy. Prominent prostate gland. Electronically Signed   By: Van Clines M.D.   On: 11/10/2014 09:58      IMPRESSION: Solitary PET Positive Nodule in the Left Upper Lung  PLAN: We discussed that it is possible this area is not in fact cancer; such as inflammation or infection. Only a biopsy would tell us if this was cancer. That being said there is increasing evidence in patients who have the correct clinical history, which he certainly does, that SBRT is to be effective in treating this nodule which is likely non-small cell lung cancer. We discussed that he has stage I lung cancer and the results of SBRT will provide a greater than 85% local control. We discussed that he may fail in the mediastinum, hilum, or elsewhere in the body and radiation was a local only process. We discussed the process of simulation and the use of abdominal  pad  to decrease respiratory motion. We discussed the use of 4-dimensional simulation to minimize normal lung tissue treated. We discussed 3- 5 treatments occurring every other day as an outpatient. We discussed these treatments will last about 10-20 minutes and he would be here at the  hospital for about an hour. We discussed that SBRT was unlikely to make his breathing any worse. It is also unlikely to make his breathing symptoms any better. We discussed possible side effects; including his shoulder pain due to arm positioning and possible rib fracture due to the pleural-based nodules proximity to the ribs. I discussed with him that without treatement this could develop into a more aggressive or even metastatic cancer. He understands these risks.  CT simulation is scheduled next Tuesday, November 15, at [facility]. The patient signed a consent form and this was placed in his medical chart.     ------------------------------------------------  Blair Promise, PhD, MD  This document serves as a record of services personally performed by Gery Pray, MD. It was created on his behalf by Darcus Austin, a trained medical scribe. The creation of this record is based on the scribe's personal observations and the provider's statements to them. This document has been checked and approved by the attending provider.

## 2014-11-30 ENCOUNTER — Other Ambulatory Visit: Payer: Self-pay | Admitting: Radiation Oncology

## 2014-11-30 ENCOUNTER — Ambulatory Visit
Admission: RE | Admit: 2014-11-30 | Discharge: 2014-11-30 | Disposition: A | Discharge status: Home / Self Care | Payer: Medicare Other | Source: Ambulatory Visit | Attending: Radiation Oncology | Admitting: Radiation Oncology

## 2014-11-30 DIAGNOSIS — C3492 Malignant neoplasm of unspecified part of left bronchus or lung: Secondary | ICD-10-CM | POA: Diagnosis not present

## 2014-11-30 DIAGNOSIS — Z510 Encounter for antineoplastic radiation therapy: Secondary | ICD-10-CM | POA: Insufficient documentation

## 2014-11-30 DIAGNOSIS — R911 Solitary pulmonary nodule: Secondary | ICD-10-CM | POA: Diagnosis not present

## 2014-12-05 NOTE — Progress Notes (Signed)
  Radiation Oncology         [phone] ________________________________  Name: Benjamin Patel MRN: [id number]  Date: 11/30/2014  DOB: 1927-11-01  SIMULATION AND TREATMENT PLANNING NOTE    ICD-9-CM ICD-10-CM   1. Non-small cell carcinoma of lung, stage 1, left (HCC) 162.9 C34.92     DIAGNOSIS:  Solitary PET Positive Nodule in the Left Upper Lung  NARRATIVE:  The patient was brought to the [facility].  Identity was confirmed.  All relevant records and images related to the planned course of therapy were reviewed.  The patient freely provided informed written consent to proceed with treatment after reviewing the details related to the planned course of therapy. The consent form was witnessed and verified by the simulation staff.  Then, the patient was set-up in a stable reproducible  supine position for radiation therapy.  CT images were obtained.  Surface markings were placed.  The CT images were loaded into the planning software.  Then the target and avoidance structures were contoured.  Treatment planning then occurred.  The radiation prescription was entered and confirmed.  Then, I designed and supervised the construction of a total of 2 medically necessary complex treatment devices.  I have requested : 3D Simulation  I have requested a DVH of the following structures: ITV, PTV, lungs, spinal cord, esophagus.  I have ordered:dose calc.   PLAN:  The patient will receive 50 Gy in 10 fractions.  ________________________________  -----------------------------------  Blair Promise, PhD, MD

## 2014-12-05 NOTE — Progress Notes (Signed)
  Radiation Oncology         [phone] ________________________________  Name: Benjamin Patel MRN: [id number]  Date: 11/30/2014  DOB: 1927-11-28  RESPIRATORY MOTION MANAGEMENT SIMULATION  NARRATIVE:  In order to account for effect of respiratory motion on target structures and other organs in the planning and delivery of radiotherapy, this patient underwent respiratory motion management simulation.  To accomplish this, when the patient was brought to the CT simulation planning suite, 4D respiratoy motion management CT images were obtained.  The CT images were loaded into the planning software.  Then, using a variety of tools including Cine, MIP, and standard views, the target volume and planning target volumes (PTV) were delineated.  Avoidance structures were contoured.  Treatment planning then occurred.  Dose volume histograms were generated and reviewed for each of the requested structure.  The resulting plan was carefully reviewed and approved today.  -----------------------------------  Blair Promise, PhD, MD

## 2014-12-05 NOTE — Progress Notes (Signed)
  Radiation Oncology         [phone] ________________________________  Name: Benjamin Patel MRN: [id number]  Date: 11/30/2014  DOB: 1927-09-04  Special treatment procedure Note    ICD-9-CM ICD-10-CM   1. Non-small cell carcinoma of lung, stage 1, left (HCC) 162.9 C34.92       NARRATIVE: The patient will be treated with high dose per fraction. Given the increased  time and increased potential for toxicities with high dose per fractionation special monitoring techniques necessary, this constitutes a special treatment procedure  -----------------------------------  Blair Promise, PhD, MD

## 2014-12-08 DIAGNOSIS — R911 Solitary pulmonary nodule: Secondary | ICD-10-CM | POA: Diagnosis not present

## 2014-12-13 ENCOUNTER — Ambulatory Visit
Admission: RE | Admit: 2014-12-13 | Discharge: 2014-12-13 | Disposition: A | Discharge status: Home / Self Care | Payer: Medicare Other | Source: Ambulatory Visit | Attending: Radiation Oncology | Admitting: Radiation Oncology

## 2014-12-13 DIAGNOSIS — R911 Solitary pulmonary nodule: Secondary | ICD-10-CM | POA: Diagnosis not present

## 2014-12-13 DIAGNOSIS — C3492 Malignant neoplasm of unspecified part of left bronchus or lung: Secondary | ICD-10-CM | POA: Diagnosis not present

## 2014-12-13 DIAGNOSIS — Z510 Encounter for antineoplastic radiation therapy: Secondary | ICD-10-CM | POA: Diagnosis not present

## 2014-12-14 ENCOUNTER — Ambulatory Visit
Admission: RE | Admit: 2014-12-14 | Discharge: 2014-12-14 | Disposition: A | Discharge status: Home / Self Care | Payer: Medicare Other | Source: Ambulatory Visit | Attending: Radiation Oncology | Admitting: Radiation Oncology

## 2014-12-14 ENCOUNTER — Encounter: Payer: Self-pay | Admitting: Radiation Oncology

## 2014-12-14 VITALS — BP 151/74 | HR 82 | Temp 97.5°F | Ht 69.0 in | Wt 175.8 lb

## 2014-12-14 DIAGNOSIS — Z510 Encounter for antineoplastic radiation therapy: Secondary | ICD-10-CM | POA: Diagnosis not present

## 2014-12-14 DIAGNOSIS — C3492 Malignant neoplasm of unspecified part of left bronchus or lung: Secondary | ICD-10-CM | POA: Diagnosis not present

## 2014-12-14 DIAGNOSIS — R911 Solitary pulmonary nodule: Secondary | ICD-10-CM | POA: Diagnosis not present

## 2014-12-14 NOTE — Progress Notes (Signed)
Benjamin Patel is here for his 2nd treatment of SBRT to his LUL. He denies any pain at this time. He does admit to feeling short of breath with exertion, but normal activities don't bother him. He reports a good appetite. They have a question about the number of radiation treatments he is to receive, as they thought he was only going to have 5 total treatments.   BP 151/74 mmHg  Pulse 82  Temp(Src) 97.5 F (36.4 C)  Ht '5\' 9"'$  (1.753 m)  Wt 175 lb 12.8 oz (79.742 kg)  BMI 25.95 kg/m2  SpO2 97%   Wt Readings from Last 3 Encounters:  12/14/14 175 lb 12.8 oz (79.742 kg)  11/25/14 176 lb 1.6 oz (79.878 kg)  11/24/14 176 lb (79.833 kg)

## 2014-12-14 NOTE — Progress Notes (Signed)
  Radiation Oncology         [phone] ________________________________  Name: HARREL FERRONE MRN: [id number]  Date: 12/14/2014  DOB: [birth year]  Weekly Radiation Therapy Management    ICD-9-CM ICD-10-CM   1. Non-small cell carcinoma of lung, stage 1, left (HCC) 162.9 C34.92      Current Dose: 10 Gy     Planned Dose:  50 Gy  Narrative . . . . . . . . The patient presents for routine under treatment assessment.                                   The patient is without complaint. No changes thus far with his treatment                                 Set-up films were reviewed.                                 The chart was checked. Physical Findings. . .  height is '5\' 9"'$  (1.753 m) and weight is 175 lb 12.8 oz (79.742 kg). His temperature is 97.5 F (36.4 C). His blood pressure is 151/74 and his pulse is 82. His oxygen saturation is 97%. . The lungs are clear. The heart has a regular rhythm and rate   Impression . . . . . . . The patient is tolerating radiation. Plan . . . . . . . . . . . . Continue treatment as planned.  ________________________________   Blair Promise, PhD, MD

## 2014-12-15 ENCOUNTER — Ambulatory Visit
Admission: RE | Admit: 2014-12-15 | Discharge: 2014-12-15 | Disposition: A | Discharge status: Home / Self Care | Payer: Medicare Other | Source: Ambulatory Visit | Attending: Radiation Oncology | Admitting: Radiation Oncology

## 2014-12-15 DIAGNOSIS — R911 Solitary pulmonary nodule: Secondary | ICD-10-CM | POA: Diagnosis not present

## 2014-12-15 DIAGNOSIS — C3492 Malignant neoplasm of unspecified part of left bronchus or lung: Secondary | ICD-10-CM | POA: Diagnosis not present

## 2014-12-15 DIAGNOSIS — Z510 Encounter for antineoplastic radiation therapy: Secondary | ICD-10-CM | POA: Diagnosis not present

## 2014-12-16 ENCOUNTER — Ambulatory Visit
Admission: RE | Admit: 2014-12-16 | Discharge: 2014-12-16 | Disposition: A | Discharge status: Home / Self Care | Payer: Medicare Other | Source: Ambulatory Visit | Attending: Radiation Oncology | Admitting: Radiation Oncology

## 2014-12-16 DIAGNOSIS — C3492 Malignant neoplasm of unspecified part of left bronchus or lung: Secondary | ICD-10-CM

## 2014-12-16 DIAGNOSIS — Z510 Encounter for antineoplastic radiation therapy: Secondary | ICD-10-CM | POA: Diagnosis not present

## 2014-12-16 DIAGNOSIS — R911 Solitary pulmonary nodule: Secondary | ICD-10-CM | POA: Diagnosis not present

## 2014-12-17 ENCOUNTER — Ambulatory Visit
Admission: RE | Admit: 2014-12-17 | Discharge: 2014-12-17 | Disposition: A | Discharge status: Home / Self Care | Payer: Medicare Other | Source: Ambulatory Visit | Attending: Radiation Oncology | Admitting: Radiation Oncology

## 2014-12-17 DIAGNOSIS — Z510 Encounter for antineoplastic radiation therapy: Secondary | ICD-10-CM | POA: Diagnosis not present

## 2014-12-17 DIAGNOSIS — R911 Solitary pulmonary nodule: Secondary | ICD-10-CM | POA: Diagnosis not present

## 2014-12-17 DIAGNOSIS — C3492 Malignant neoplasm of unspecified part of left bronchus or lung: Secondary | ICD-10-CM | POA: Diagnosis not present

## 2014-12-20 ENCOUNTER — Ambulatory Visit
Admission: RE | Admit: 2014-12-20 | Discharge: 2014-12-20 | Disposition: A | Discharge status: Home / Self Care | Payer: Medicare Other | Source: Ambulatory Visit | Attending: Radiation Oncology | Admitting: Radiation Oncology

## 2014-12-20 DIAGNOSIS — Z510 Encounter for antineoplastic radiation therapy: Secondary | ICD-10-CM | POA: Diagnosis not present

## 2014-12-20 DIAGNOSIS — C3492 Malignant neoplasm of unspecified part of left bronchus or lung: Secondary | ICD-10-CM | POA: Diagnosis not present

## 2014-12-20 DIAGNOSIS — R911 Solitary pulmonary nodule: Secondary | ICD-10-CM | POA: Diagnosis not present

## 2014-12-21 ENCOUNTER — Ambulatory Visit
Admission: RE | Admit: 2014-12-21 | Discharge: 2014-12-21 | Disposition: A | Discharge status: Home / Self Care | Payer: Medicare Other | Source: Ambulatory Visit | Attending: Radiation Oncology | Admitting: Radiation Oncology

## 2014-12-21 VITALS — BP 125/72 | HR 82 | Temp 97.7°F | Ht 69.0 in | Wt 174.1 lb

## 2014-12-21 DIAGNOSIS — C3492 Malignant neoplasm of unspecified part of left bronchus or lung: Secondary | ICD-10-CM | POA: Diagnosis not present

## 2014-12-21 DIAGNOSIS — Z510 Encounter for antineoplastic radiation therapy: Secondary | ICD-10-CM | POA: Diagnosis not present

## 2014-12-21 DIAGNOSIS — R911 Solitary pulmonary nodule: Secondary | ICD-10-CM | POA: Diagnosis not present

## 2014-12-21 NOTE — Progress Notes (Signed)
  Radiation Oncology         [phone] ________________________________  Name: NIHAR KLUS MRN: [id number]  Date: 12/21/2014  DOB: 03-Mar-1927  Weekly Radiation Therapy Management    ICD-9-CM ICD-10-CM   1. Non-small cell carcinoma of lung, stage 1, left (HCC) 162.9 C34.92      Current Dose: 35 Gy     Planned Dose:  50 Gy  Narrative . . . . . . . . The patient presents for routine under treatment assessment.                                   The patient is without complaint. No breathing problems coughing or fatigue                                 Set-up films were reviewed.                                 The chart was checked. Physical Findings. . .  height is '5\' 9"'$  (1.753 m) and weight is 174 lb 1.6 oz (78.971 kg). His temperature is 97.7 F (36.5 C). His blood pressure is 125/72 and his pulse is 82. His oxygen saturation is 98%. . Weight essentially stable.  No significant changes. The lungs are clear. The heart has a regular rhythm and rate Impression . . . . . . . The patient is tolerating radiation. Plan . . . . . . . . . . . . Continue treatment as planned.  ________________________________   Blair Promise, PhD, MD

## 2014-12-21 NOTE — Progress Notes (Signed)
Mr. Coutant is here for his 7th SBRT treatment today. He reports he is feeling well and eating well. He denies shortness of breath performing normal daily activities. He has no complaints at this time.   BP 125/72 mmHg  Pulse 82  Temp(Src) 97.7 F (36.5 C)  Ht '5\' 9"'$  (1.753 m)  Wt 174 lb 1.6 oz (78.971 kg)  BMI 25.70 kg/m2  SpO2 98%   Wt Readings from Last 3 Encounters:  12/21/14 174 lb 1.6 oz (78.971 kg)  12/14/14 175 lb 12.8 oz (79.742 kg)  11/25/14 176 lb 1.6 oz (79.878 kg)

## 2014-12-22 ENCOUNTER — Ambulatory Visit
Admission: RE | Admit: 2014-12-22 | Discharge: 2014-12-22 | Disposition: A | Discharge status: Home / Self Care | Payer: Medicare Other | Source: Ambulatory Visit | Attending: Radiation Oncology | Admitting: Radiation Oncology

## 2014-12-22 DIAGNOSIS — R911 Solitary pulmonary nodule: Secondary | ICD-10-CM | POA: Diagnosis not present

## 2014-12-22 DIAGNOSIS — Z510 Encounter for antineoplastic radiation therapy: Secondary | ICD-10-CM | POA: Diagnosis not present

## 2014-12-22 DIAGNOSIS — C3492 Malignant neoplasm of unspecified part of left bronchus or lung: Secondary | ICD-10-CM

## 2014-12-23 ENCOUNTER — Encounter: Payer: Self-pay | Admitting: Emergency Medicine

## 2014-12-23 ENCOUNTER — Ambulatory Visit (INDEPENDENT_AMBULATORY_CARE_PROVIDER_SITE_OTHER): Payer: Medicare Other | Admitting: Emergency Medicine

## 2014-12-23 ENCOUNTER — Ambulatory Visit
Admission: RE | Admit: 2014-12-23 | Discharge: 2014-12-23 | Disposition: A | Discharge status: Home / Self Care | Payer: Medicare Other | Source: Ambulatory Visit | Attending: Radiation Oncology | Admitting: Radiation Oncology

## 2014-12-23 VITALS — BP 122/72 | HR 87 | Temp 97.4°F | Resp 16 | Ht 69.0 in | Wt 173.0 lb

## 2014-12-23 DIAGNOSIS — I10 Essential (primary) hypertension: Secondary | ICD-10-CM

## 2014-12-23 DIAGNOSIS — L989 Disorder of the skin and subcutaneous tissue, unspecified: Secondary | ICD-10-CM | POA: Diagnosis not present

## 2014-12-23 DIAGNOSIS — I6523 Occlusion and stenosis of bilateral carotid arteries: Secondary | ICD-10-CM | POA: Diagnosis not present

## 2014-12-23 DIAGNOSIS — C3492 Malignant neoplasm of unspecified part of left bronchus or lung: Secondary | ICD-10-CM | POA: Diagnosis not present

## 2014-12-23 DIAGNOSIS — E1141 Type 2 diabetes mellitus with diabetic mononeuropathy: Secondary | ICD-10-CM

## 2014-12-23 DIAGNOSIS — Z510 Encounter for antineoplastic radiation therapy: Secondary | ICD-10-CM | POA: Diagnosis not present

## 2014-12-23 DIAGNOSIS — C3412 Malignant neoplasm of upper lobe, left bronchus or lung: Secondary | ICD-10-CM

## 2014-12-23 DIAGNOSIS — R911 Solitary pulmonary nodule: Secondary | ICD-10-CM | POA: Diagnosis not present

## 2014-12-23 DIAGNOSIS — J301 Allergic rhinitis due to pollen: Secondary | ICD-10-CM

## 2014-12-23 LAB — POC MICROSCOPIC URINALYSIS (UMFC): MUCUS RE: ABSENT

## 2014-12-23 LAB — POCT URINALYSIS DIP (MANUAL ENTRY)
BILIRUBIN UA: NEGATIVE
BILIRUBIN UA: NEGATIVE
Glucose, UA: NEGATIVE
LEUKOCYTES UA: NEGATIVE
Nitrite, UA: NEGATIVE
PH UA: 6.5
Protein Ur, POC: NEGATIVE
RBC UA: NEGATIVE
Spec Grav, UA: 1.02
Urobilinogen, UA: 0.2

## 2014-12-23 LAB — POCT GLYCOSYLATED HEMOGLOBIN (HGB A1C): HEMOGLOBIN A1C: 6.4

## 2014-12-23 LAB — GLUCOSE, POCT (MANUAL RESULT ENTRY): POC Glucose: 153 mg/dl — AB (ref 70–99)

## 2014-12-23 MED ORDER — FLUTICASONE PROPIONATE 50 MCG/ACT NA SUSP: 2.0000 | Freq: Every day | NASAL | Status: DC

## 2014-12-23 NOTE — Progress Notes (Signed)
Subjective:  This chart was scribed for Darlyne Russian, MD by Tamsen Roers, at [facility].  This patient was seen in room 21 and the patient's care was started at 9:02 AM.    Patient ID: Benjamin Patel, male    DOB: 30-Aug-1927, 81 y.o.   MRN: [id number] Chief Complaint  Patient presents with  . Follow-up  . Diabetes    Diabetes Pertinent negatives for hypoglycemia include no speech difficulty.    HPI Comments: Benjamin Patel is a 81 y.o. male with a history of diabetes and recurrent lung cancer who presents to the [facility] for a follow up.  He still stays "active" but no longer works. Patient sees his cardiologist once a year.   Rhinorrhea/ post nasal drip: Patient states that he has to blow his nose frequently.  He has not yet tried any Flonase.   Diabetes: He states his numbers have been running around 150- 170.  He was taken off of his diabetes medication last year.  He sees his eye doctor yearly.  He has not yet seen a dermatologist.  Blood pressure: He has been keeping track of his blood pressure and sees a spike at random times for no particular reason.  It is ranging in normal range but occasionally systolic pressures have been as high as 856, diastolic has always been normal. He denies any symptoms when his blood pressure increases.  BP right arm: 134/70.   Lung cancer- Patient denies any pain from the radiation treatment.  He is currently going every day for 10 days and states that it is not "too bad".       Patient Active Problem List   Diagnosis Date Noted  . Lung mass 02/02/2014  . Diabetes ([place]) 02/10/2013  . Lung cancer ([place]) 02/10/2013  . Carotid arterial disease ([place]) 02/10/2013  . Coronary artery calcification 12/22/2012  . Atrial fibrillation ([place]) 06/23/2012  . CKD (chronic kidney disease) stage 2, GFR 60-89 ml/min 06/19/2012  . COPD/ GOLD III 06/17/2012  . DM (diabetes mellitus) type II controlled with renal  manifestation ([place]) 06/17/2012  . Occlusion and stenosis of carotid artery without mention of cerebral infarction 03/20/2012  . Non-small cell carcinoma of lung, stage 1 ([place]) 02/10/2012  . Essential hypertension, benign   . Mixed hyperlipidemia   . Gout   . DM (diabetes mellitus), type 2 with peripheral vascular complications ([place])   . Carotid stenosis   . Cholelithiasis   . Renal insufficiency   . Renal artery stenosis [facility])    Past Medical History  Diagnosis Date  . Mixed hyperlipidemia     takes Pravastatin daily  . Gout     no meds required  . Abnormal prostate exam   . Carotid stenosis   . Cholelithiasis   . Renal insufficiency   . Renal artery stenosis (HCC)     right  . PAC (premature atrial contraction)   . PVCs (premature ventricular contractions)   . Essential hypertension, benign     takes Clonodine,Losartan,and Metoprolol and HCTZ  . Carotid stenosis right side    Dr.Early does carotid ultrasounds yrly-last one Mar 14-report in epic  . Asthma     as a child  . COPD (chronic obstructive pulmonary disease) (HCC)     slight   . History of bronchitis   . Pneumonia 02/08/2012  . History of colon polyps   . Enlarged prostate     slightly   .  Type II or unspecified type diabetes mellitus without mention of complication, not stated as uncontrolled     takes Metformin daily  . History of shingles   . Skin cancer     Lung Ca- Left Lung  . Arthritis     Gout    Past Surgical History  Procedure Laterality Date  . No past surgeries    . Colonoscopy    . Video assisted thoracoscopy Left 06/16/2012    Procedure: VIDEO ASSISTED THORACOSCOPY;  Surgeon: [name], MD;  Location: [place];  Service: Thoracic;  Laterality: Left;  . Segmentecomy Left 06/16/2012    Procedure: left lower lobe superior SEGMENTECTOMY with node dissection;  Surgeon: [name], MD;  Location: [place];  Service: Thoracic;  Laterality: Left;  left superior   Allergies  Allergen  Reactions  . Ace Inhibitors Cough  . Codeine Other (See Comments)    GI Upset  . Indomethacin Other (See Comments)    Elevated BP  . Losartan Swelling    ANGIOEDEMA/    Prior to Admission medications   Medication Sig Start Date End Date Taking? Authorizing Provider  cloNIDine (CATAPRES) 0.2 MG tablet Take 1 tablet (0.2 mg total) by mouth 2 (two) times daily. 08/17/14   Darlyne Russian, MD  Flaxseed, Linseed, (FLAXSEED OIL PO) Take 1 tablet by mouth daily.    Historical Provider, MD  gabapentin (NEURONTIN) 100 MG capsule Take 2 capsules up to 3 times a day 08/17/14   Darlyne Russian, MD  hydrochlorothiazide (HYDRODIURIL) 25 MG tablet Take 1 tablet (25 mg total) by mouth daily. 08/17/14   Darlyne Russian, MD  metoprolol succinate (TOPROL-XL) 100 MG 24 hr tablet Take 1 tablet (100 mg total) by mouth daily. Take with or immediately following a meal. 08/17/14   Darlyne Russian, MD  pravastatin (PRAVACHOL) 20 MG tablet Take 1 tablet (20 mg total) by mouth at bedtime. 08/17/14   Darlyne Russian, MD   Social History   Social History  . Marital Status: Married    Spouse Name: N/A  . Number of Children: 3  . Years of Education: N/A   Occupational History  . retired     Herbalist   Social History Main Topics  . Smoking status: Former Smoker -- 1.50 packs/day for 30 years    Types: Cigarettes    Quit date: 01/16/1976  . Smokeless tobacco: Never Used  . Alcohol Use: No  . Drug Use: No  . Sexual Activity: Not on file   Other Topics Concern  . Not on file   Social History Narrative        Review of Systems  Constitutional: Negative for fever and chills.  HENT: Positive for postnasal drip and rhinorrhea.   Eyes: Negative for pain, redness and itching.  Respiratory: Negative for cough.   Gastrointestinal: Negative for nausea and vomiting.  Musculoskeletal: Negative for neck pain and neck stiffness.  Neurological: Negative for syncope and speech difficulty.       Objective:   Physical  Exam  Filed Vitals:   12/23/14 0842  BP: 122/72  Pulse: 87  Temp: 97.4 F (36.3 C)  Resp: 16  Height: '5\' 9"'$  (1.753 m)  Weight: 173 lb (78.472 kg)   CONSTITUTIONAL: Well developed/well nourished HEAD: Normocephalic/atraumatic EYES: EOMI/PERRL ENMT: Mucous membranes moist NECK: supple no meningeal signs SPINE/BACK:entire spine nontender CV: He has a grade 2 carotid bruit on the right.  LUNGS: Lungs are clear to auscultation bilaterally,  no apparent distress ABDOMEN: soft, nontender, no rebound or guarding, bowel sounds noted throughout abdomen GU:no cva tenderness NEURO: Pt is awake/alert/appropriate, moves all extremitiesx4.  No facial droop.   EXTREMITIES: pulses normal/equal, full ROM SKIN: Scaly red irritated patch on his left frontal forehead, similar lesion over the right cheek.  PSYCH: no abnormalities of mood noted, alert and oriented to situation  Results for orders placed or performed in visit on 12/23/14  POCT glucose (manual entry)  Result Value Ref Range   POC Glucose 153 (A) 70 - 99 mg/dl  POCT Microscopic Urinalysis ([facility])  Result Value Ref Range   WBC,UR,HPF,POC None None WBC/hpf   RBC,UR,HPF,POC None None RBC/hpf   Bacteria None None, Too numerous to count   Mucus Absent Absent   Epithelial Cells, UR Per Microscopy Few (A) None, Too numerous to count cells/hpf  POCT urinalysis dipstick  Result Value Ref Range   Color, UA yellow yellow   Clarity, UA clear clear   Glucose, UA negative negative   Bilirubin, UA negative negative   Ketones, POC UA negative negative   Spec Grav, UA 1.020    Blood, UA negative negative   pH, UA 6.5    Protein Ur, POC negative negative   Urobilinogen, UA 0.2    Nitrite, UA Negative Negative   Leukocytes, UA Negative Negative  POCT glycosylated hemoglobin (Hb A1C)  Result Value Ref Range   Hemoglobin A1C 6.4        Assessment & Plan:   hemoglobin A1c is acceptable at 6.4 we'll recheck in 4 months. Urine looks good. He  does have a history of urinary tract infections. He is finishing in his radiation treatment for lung cancer and doing well. Referral made to dermatology regarding skin lesions.I personally performed the services described in this documentation, which was scribed in my presence. The recorded information has been reviewed and is accurate. [name] MD.

## 2014-12-23 NOTE — Progress Notes (Signed)
Subjective:  This chart was scribed for Darlyne Russian, MD by Tamsen Roers, at [facility].  This patient was seen in room 21 and the patient's care was started at 9:02 AM.    Patient ID: Benjamin Patel, male    DOB: 30-Jan-1927, 81 y.o.   MRN: [id number] Chief Complaint  Patient presents with   Follow-up   Diabetes    HPI  HPI Comments: Benjamin Patel is a 81 y.o. male with a history of diabetes and recurrent lung cancer who presents to the [facility] for a follow up.  He still stays "active" but no longer works. Patient sees his cardiologist once a year.   Rhinorrhea/ post nasal drip: Patient states that he has to blow his nose frequently.  He has not yet tried any Flonase.   Diabetes: He states his numbers have been running around 150- 170.  He was taken off of his diabetes medication last year.  He sees his eye doctor yearly.  He has not yet seen a dermatologist.  Blood pressure: He has been keeping track of his blood pressure and sees a spike at random times for no particular reason.  It is ranging in normal range but occasionally systolic pressures have been as high as 732, diastolic has always been normal. He denies any symptoms when his blood pressure increases.  BP right arm: 134/70.   Lung cancer- Patient denies any pain from the radiation treatment.  He is currently going every day for 10 days and states that it is not "too bad".       Patient Active Problem List   Diagnosis Date Noted   Lung mass 02/02/2014   Diabetes ([place]) 02/10/2013   Lung cancer ([place]) 02/10/2013   Carotid arterial disease ([place]) 02/10/2013   Coronary artery calcification 12/22/2012   Atrial fibrillation ([place]) 06/23/2012   CKD (chronic kidney disease) stage 2, GFR 60-89 ml/min 06/19/2012   COPD/ GOLD III 06/17/2012   DM (diabetes mellitus) type II controlled with renal manifestation ([place]) 06/17/2012   Occlusion and stenosis of carotid artery  without mention of cerebral infarction 03/20/2012   Non-small cell carcinoma of lung, stage 1 ([place]) 02/10/2012   Essential hypertension, benign    Mixed hyperlipidemia    Gout    DM (diabetes mellitus), type 2 with peripheral vascular complications ([place])    Carotid stenosis    Cholelithiasis    Renal insufficiency    Renal artery stenosis ([place])    Past Medical History  Diagnosis Date   Mixed hyperlipidemia     takes Pravastatin daily   Gout     no meds required   Abnormal prostate exam    Carotid stenosis    Cholelithiasis    Renal insufficiency    Renal artery stenosis (HCC)     right   PAC (premature atrial contraction)    PVCs (premature ventricular contractions)    Essential hypertension, benign     takes Clonodine,Losartan,and Metoprolol and HCTZ   Carotid stenosis right side    Dr.Early does carotid ultrasounds yrly-last one Mar 14-report in epic   Asthma     as a child   COPD (chronic obstructive pulmonary disease) (HCC)     slight    History of bronchitis    Pneumonia 02/08/2012   History of colon polyps    Enlarged prostate     slightly    Type II or unspecified type diabetes mellitus without mention  of complication, not stated as uncontrolled     takes Metformin daily   History of shingles    Skin cancer     Lung Ca- Left Lung   Arthritis     Gout    Past Surgical History  Procedure Laterality Date   No past surgeries     Colonoscopy     Video assisted thoracoscopy Left 06/16/2012    Procedure: VIDEO ASSISTED THORACOSCOPY;  Surgeon: [name], MD;  Location: [place];  Service: Thoracic;  Laterality: Left;   Segmentecomy Left 06/16/2012    Procedure: left lower lobe superior SEGMENTECTOMY with node dissection;  Surgeon: [name], MD;  Location: [place];  Service: Thoracic;  Laterality: Left;  left superior   Allergies  Allergen Reactions   Ace Inhibitors Cough   Codeine Other (See Comments)    GI  Upset   Indomethacin Other (See Comments)    Elevated BP   Losartan Swelling    ANGIOEDEMA/    Prior to Admission medications   Medication Sig Start Date End Date Taking? Authorizing Provider  cloNIDine (CATAPRES) 0.2 MG tablet Take 1 tablet (0.2 mg total) by mouth 2 (two) times daily. 08/17/14   Darlyne Russian, MD  Flaxseed, Linseed, (FLAXSEED OIL PO) Take 1 tablet by mouth daily.    Historical Provider, MD  gabapentin (NEURONTIN) 100 MG capsule Take 2 capsules up to 3 times a day 08/17/14   Darlyne Russian, MD  hydrochlorothiazide (HYDRODIURIL) 25 MG tablet Take 1 tablet (25 mg total) by mouth daily. 08/17/14   Darlyne Russian, MD  metoprolol succinate (TOPROL-XL) 100 MG 24 hr tablet Take 1 tablet (100 mg total) by mouth daily. Take with or immediately following a meal. 08/17/14   Darlyne Russian, MD  pravastatin (PRAVACHOL) 20 MG tablet Take 1 tablet (20 mg total) by mouth at bedtime. 08/17/14   Darlyne Russian, MD   Social History   Social History   Marital Status: Married    Spouse Name: N/A   Number of Children: 3   Years of Education: N/A   Occupational History   retired     Herbalist   Social History Main Topics   Smoking status: Former Smoker -- 1.50 packs/day for 30 years    Types: Cigarettes    Quit date: 01/16/1976   Smokeless tobacco: Never Used   Alcohol Use: No   Drug Use: No   Sexual Activity: Not on file   Other Topics Concern   Not on file   Social History Narrative        Review of Systems  Constitutional: Negative for fever and chills.  HENT: Positive for postnasal drip and rhinorrhea.   Eyes: Negative for pain, redness and itching.  Respiratory: Negative for cough.   Gastrointestinal: Negative for nausea and vomiting.  Musculoskeletal: Negative for neck pain and neck stiffness.  Neurological: Negative for syncope and speech difficulty.       Objective:   Physical Exam  Filed Vitals:   12/23/14 0842  BP: 122/72  Pulse: 87  Temp: 97.4  F (36.3 C)  Resp: 16  Height: '5\' 9"'$  (1.753 m)  Weight: 173 lb (78.472 kg)   CONSTITUTIONAL: Well developed/well nourished HEAD: Normocephalic/atraumatic EYES: EOMI/PERRL ENMT: Mucous membranes moist NECK: supple no meningeal signs SPINE/BACK:entire spine nontender CV: He has a grade 2 carotid bruit on the right.  LUNGS: Lungs are clear to auscultation bilaterally, no apparent distress ABDOMEN: soft, nontender, no rebound or  guarding, bowel sounds noted throughout abdomen GU:no cva tenderness NEURO: Pt is awake/alert/appropriate, moves all extremitiesx4.  No facial droop.   EXTREMITIES: pulses normal/equal, full ROM SKIN: Scaly red irritated patch on his left frontal forehead, similar lesion over the right cheek.  PSYCH: no abnormalities of mood noted, alert and oriented to situation  Results for orders placed or performed in visit on 12/23/14  POCT glucose (manual entry)  Result Value Ref Range   POC Glucose 153 (A) 70 - 99 mg/dl  POCT Microscopic Urinalysis ([facility])  Result Value Ref Range   WBC,UR,HPF,POC None None WBC/hpf   RBC,UR,HPF,POC None None RBC/hpf   Bacteria None None, Too numerous to count   Mucus Absent Absent   Epithelial Cells, UR Per Microscopy Few (A) None, Too numerous to count cells/hpf  POCT urinalysis dipstick  Result Value Ref Range   Color, UA yellow yellow   Clarity, UA clear clear   Glucose, UA negative negative   Bilirubin, UA negative negative   Ketones, POC UA negative negative   Spec Grav, UA 1.020    Blood, UA negative negative   pH, UA 6.5    Protein Ur, POC negative negative   Urobilinogen, UA 0.2    Nitrite, UA Negative Negative   Leukocytes, UA Negative Negative  POCT glycosylated hemoglobin (Hb A1C)  Result Value Ref Range   Hemoglobin A1C 6.4        Assessment & Plan:   hemoglobin A1c is acceptable at 6.4 we'll recheck in 4 months. Urine looks good. He does have a history of urinary tract infections. He is finishing in his  radiation treatment for lung cancer and doing well. Referral made to dermatology regarding skin lesions.I personally performed the services described in this documentation, which was scribed in my presence. The recorded information has been reviewed and is accurate.

## 2014-12-24 ENCOUNTER — Ambulatory Visit
Admission: RE | Admit: 2014-12-24 | Discharge: 2014-12-24 | Disposition: A | Discharge status: Home / Self Care | Payer: Medicare Other | Source: Ambulatory Visit | Attending: Radiation Oncology | Admitting: Radiation Oncology

## 2014-12-24 DIAGNOSIS — C3492 Malignant neoplasm of unspecified part of left bronchus or lung: Secondary | ICD-10-CM | POA: Diagnosis not present

## 2014-12-24 DIAGNOSIS — R911 Solitary pulmonary nodule: Secondary | ICD-10-CM | POA: Diagnosis not present

## 2014-12-24 DIAGNOSIS — Z510 Encounter for antineoplastic radiation therapy: Secondary | ICD-10-CM | POA: Diagnosis not present

## 2015-01-02 ENCOUNTER — Encounter: Payer: Self-pay | Admitting: Radiation Oncology

## 2015-01-02 DIAGNOSIS — R911 Solitary pulmonary nodule: Secondary | ICD-10-CM

## 2015-01-02 NOTE — Progress Notes (Signed)
  Radiation Oncology         [phone] ________________________________  Name: Benjamin Patel MRN: [id number]  Date: 01/02/2015  DOB: [birth year]  End of Treatment Note  Diagnosis:    PET positive nodule in the left upper lobe  Indication for treatment:  Definitive treatment       Radiation treatment dates:   November 28 through December 9  Site/dose:   50 gray in 10 fractions  Beams/energy:   SBRT/-VMAT , 6 mv photons  Narrative: The patient tolerated radiation treatment relatively well.   No specific symptoms during the course of his treatment.  Plan: The patient has completed radiation treatment. The patient will return to radiation oncology clinic for routine followup in one month. I advised them to call or return sooner if they have any questions or concerns related to their recovery or treatment.  -----------------------------------  Blair Promise, PhD, MD

## 2015-02-02 ENCOUNTER — Encounter: Payer: Self-pay | Admitting: Oncology

## 2015-02-03 ENCOUNTER — Ambulatory Visit
Admission: RE | Admit: 2015-02-03 | Discharge: 2015-02-03 | Disposition: A | Discharge status: Home / Self Care | Payer: Medicare Other | Source: Ambulatory Visit | Attending: Radiation Oncology | Admitting: Radiation Oncology

## 2015-02-03 ENCOUNTER — Telehealth: Payer: Self-pay

## 2015-02-03 ENCOUNTER — Encounter: Payer: Self-pay | Admitting: Radiation Oncology

## 2015-02-03 VITALS — BP 142/82 | HR 73 | Temp 97.5°F | Resp 18 | Ht 69.0 in | Wt 175.6 lb

## 2015-02-03 DIAGNOSIS — C3492 Malignant neoplasm of unspecified part of left bronchus or lung: Secondary | ICD-10-CM

## 2015-02-03 NOTE — Progress Notes (Signed)
  Radiation Oncology         [phone] ________________________________  Name: Benjamin Patel MRN: [id number]  Date: 02/03/2015  DOB: October 30, 1927  Follow-Up Visit Note  CC: DAUB, Lina Sayre, MD  [name], *   Diagnosis: PET positive nodule in the left upper lobe    Interval Since Last Radiation: 6 weeks. November 28 through December 24, 2014  Site/dose:   50 gray in 10 fractions  Narrative:  The patient returns today for routine follow-up. He denies any pain. He reports feeling short of breath after exercise. He said he had this before radiation. He denies a cough or hemoptysis. He reports having a good energy level. The patient's wife was present during the encounter.  ALLERGIES:  is allergic to ace inhibitors; codeine; indomethacin; and losartan.  Meds: Current Outpatient Prescriptions  Medication Sig Dispense Refill  . cloNIDine (CATAPRES) 0.2 MG tablet Take 1 tablet (0.2 mg total) by mouth 2 (two) times daily. 60 tablet 11  . Flaxseed, Linseed, (FLAXSEED OIL PO) Take 1 tablet by mouth daily.    . fluticasone (FLONASE) 50 MCG/ACT nasal spray Place 2 sprays into both nostrils daily. 16 g 6  . gabapentin (NEURONTIN) 100 MG capsule Take 2 capsules up to 3 times a day 180 capsule 11  . hydrochlorothiazide (HYDRODIURIL) 25 MG tablet Take 1 tablet (25 mg total) by mouth daily. 30 tablet 11  . metoprolol succinate (TOPROL-XL) 100 MG 24 hr tablet Take 1 tablet (100 mg total) by mouth daily. Take with or immediately following a meal. 30 tablet 11  . pravastatin (PRAVACHOL) 20 MG tablet Take 1 tablet (20 mg total) by mouth at bedtime. 30 tablet 11   No current facility-administered medications for this encounter.    Physical Findings: The patient is in no acute distress. Patient is alert and oriented.  height is '5\' 9"'$  (1.753 m) and weight is 175 lb 9.6 oz (79.652 kg). His oral temperature is 97.5 F (36.4 C). His blood pressure is 142/82 and his pulse is 73. His respiration  is 18 and oxygen saturation is 96%.   Lungs are clear to auscultation bilaterally. Heart has regular rate and rhythm. No palpable cervical, supraclavicular, or axillary adenopathy.  Lab Findings: Lab Results  Component Value Date   WBC 9.5 11/16/2014   HGB 14.8 11/16/2014   HCT 43.8 11/16/2014   MCV 89.0 11/16/2014   PLT 219 11/16/2014    Radiographic Findings: No results found.  Impression: The patient is doing well this time without any appreciable side effects from his treatment.  Plan:  follow up with radiation oncology in 3 months and a CT scan afterwards.  ____________________________________  Blair Promise, PhD, MD  This document serves as a record of services personally performed by Gery Pray, MD. It was created on his behalf by Darcus Austin, a trained medical scribe. The creation of this record is based on the scribe's personal observations and the provider's statements to them. This document has been checked and approved by the attending provider.

## 2015-02-03 NOTE — Progress Notes (Signed)
Benjamin Patel here for follow up.  He denies having any pain.  He reports feeling short of breath after exercise.  He said he had this before radiation.  He denies having a cough.  He reports having a good energy level.  BP 142/82 mmHg  Pulse 73  Temp(Src) 97.5 F (36.4 C) (Oral)  Resp 18  Ht '5\' 9"'$  (1.753 m)  Wt 175 lb 9.6 oz (79.652 kg)  BMI 25.92 kg/m2  SpO2 96%

## 2015-02-28 DIAGNOSIS — L570 Actinic keratosis: Secondary | ICD-10-CM | POA: Diagnosis not present

## 2015-03-22 ENCOUNTER — Ambulatory Visit: Payer: Medicare Other | Admitting: Emergency Medicine

## 2015-03-30 ENCOUNTER — Encounter: Payer: Self-pay | Admitting: Family

## 2015-04-05 ENCOUNTER — Ambulatory Visit: Payer: Self-pay | Admitting: Family

## 2015-04-05 ENCOUNTER — Encounter (HOSPITAL_COMMUNITY): Payer: Self-pay

## 2015-04-06 ENCOUNTER — Ambulatory Visit (INDEPENDENT_AMBULATORY_CARE_PROVIDER_SITE_OTHER): Payer: Medicare Other | Admitting: Physician Assistant

## 2015-04-06 VITALS — BP 148/70 | HR 90 | Temp 97.8°F | Resp 16 | Ht 69.0 in | Wt 175.0 lb

## 2015-04-06 DIAGNOSIS — M79671 Pain in right foot: Secondary | ICD-10-CM | POA: Diagnosis not present

## 2015-04-06 DIAGNOSIS — Z8739 Personal history of other diseases of the musculoskeletal system and connective tissue: Secondary | ICD-10-CM

## 2015-04-06 DIAGNOSIS — Z8639 Personal history of other endocrine, nutritional and metabolic disease: Secondary | ICD-10-CM | POA: Diagnosis not present

## 2015-04-06 MED ORDER — HYDROCODONE-ACETAMINOPHEN 10-325 MG PO TABS: 0.5000 | ORAL_TABLET | Freq: Three times a day (TID) | ORAL | Status: DC | PRN

## 2015-04-06 MED ORDER — COLCHICINE 0.6 MG PO TABS: 0.6000 mg | ORAL_TABLET | Freq: Two times a day (BID) | ORAL | Status: DC

## 2015-04-06 NOTE — Patient Instructions (Addendum)
If you do not see improvement in pain pleae return in three days.      IF you received an x-ray today, you will receive an invoice from [facility]. Please contact [facility] at [phone] with questions or concerns regarding your invoice.   IF you received labwork today, you will receive an invoice from [employer]. Please contact [facility] at [phone] with questions or concerns regarding your invoice.   Our billing staff will not be able to assist you with questions regarding bills from these companies.  You will be contacted with the lab results as soon as they are available. The fastest way to get your results is to activate your My Chart account. Instructions are located on the last page of this paperwork. If you have not heard from [place] regarding the results in 2 weeks, please contact this office.

## 2015-04-06 NOTE — Progress Notes (Signed)
04/06/2015 9:25 AM   DOB: 1927/09/14 / MRN: [id number]  SUBJECTIVE:  Benjamin Patel is a 82 y.o. male presenting for a painful left foot that started on Sunday. He has a history of gout and has used colchicine in the past with good relief.  Ambulation makes the pain worse.  His wife reports that he is in agony.  He has not tried anything for the pain.    He is allergic to ace inhibitors; codeine; indomethacin; and losartan.   He  has a past medical history of Mixed hyperlipidemia; Gout; Abnormal prostate exam; Carotid stenosis; Cholelithiasis; Renal insufficiency; Renal artery stenosis ([place]); PAC (premature atrial contraction); PVCs (premature ventricular contractions); Essential hypertension, benign; Carotid stenosis (right side); Asthma; COPD (chronic obstructive pulmonary disease) ([place]); History of bronchitis; Pneumonia (02/08/2012); History of colon polyps; Enlarged prostate; Type II or unspecified type diabetes mellitus without mention of complication, not stated as uncontrolled; History of shingles; Skin cancer; Arthritis; and Radiation (12/13/14-12/24/14).    He  reports that he quit smoking about 39 years ago. His smoking use included Cigarettes. He has a 45 pack-year smoking history. He has never used smokeless tobacco. He reports that he does not drink alcohol or use illicit drugs. He  has no sexual activity history on file. The patient  has past surgical history that includes No past surgeries; Colonoscopy; Video assisted thoracoscopy (Left, 06/16/2012); and Segmentectomy (Left, 06/16/2012).  His family history includes Cancer in his father; Diabetes in his mother; Heart disease in his mother; Hypertension in his mother; Kidney disease in his mother; Varicose Veins in his mother.  Review of Systems  Constitutional: Negative for fever and chills.  Eyes: Negative for blurred vision.  Respiratory: Negative for cough and shortness of breath.   Cardiovascular: Negative for chest pain.    Gastrointestinal: Negative for nausea and abdominal pain.  Genitourinary: Negative for dysuria, urgency and frequency.  Musculoskeletal: Negative for myalgias.  Skin: Negative for rash.  Neurological: Negative for dizziness, tingling and headaches.  Psychiatric/Behavioral: Negative for depression. The patient is not nervous/anxious.     Problem list and medications reviewed and updated by myself where necessary, and exist elsewhere in the encounter.   OBJECTIVE:  BP 148/70 mmHg  Pulse 90  Temp(Src) 97.8 F (36.6 C) (Oral)  Resp 16  Ht '5\' 9"'$  (1.753 m)  Wt 175 lb (79.379 kg)  BMI 25.83 kg/m2  SpO2 96%  Physical Exam  Constitutional: He is oriented to person, place, and time. He appears well-developed. He does not appear ill.  Eyes: Conjunctivae and EOM are normal. Pupils are equal, round, and reactive to light.  Cardiovascular: Normal rate.   Pulmonary/Chest: Effort normal.  Abdominal: He exhibits no distension.  Musculoskeletal: Normal range of motion.  Neurological: He is alert and oriented to person, place, and time. No cranial nerve deficit. Coordination normal.  Skin: Skin is warm and dry. He is not diaphoretic.     Psychiatric: He has a normal mood and affect.  Nursing note and vitals reviewed.   Lab Results  Component Value Date   CREATININE 1.15 11/16/2014     No results found for this or any previous visit (from the past 72 hour(s)).  No results found.  ASSESSMENT AND PLAN  Abdi was seen today for gout and foot swelling.  Diagnoses and all orders for this visit:  History of gout: HPI, history and and exam is consistent. He has done well with Colchicine in the past.  There is some swelling of  the foot however he has no posterior calf and thigh tenderness.  Advised that if he is not better in 3 days to return.  -     colchicine 0.6 MG tablet; Take 1 tablet (0.6 mg total) by mouth 2 (two) times daily. For gout (colcrys)  Right foot pain -      HYDROcodone-acetaminophen (NORCO) 10-325 MG tablet; Take 0.5 tablets by mouth every 8 (eight) hours as needed.   The patient was advised to call or return to clinic if he does not see an improvement in symptoms or to seek the care of the closest emergency department if he worsens with the above plan.   Philis Fendt, MHS, PA-C [facility] 04/06/2015 9:25 AM

## 2015-04-08 ENCOUNTER — Telehealth: Payer: Self-pay | Admitting: Emergency Medicine

## 2015-04-08 NOTE — Telephone Encounter (Signed)
Patient request for Dr. Perfecto Kingdom assistant to give him a call concerning his right foot. [phone].

## 2015-04-11 NOTE — Telephone Encounter (Signed)
Spoke with patient and he finished the medication he was given for his Right foot, the pain is gone, however he still has the swelling. The swelling has not gotten worse not better during the coarse of medication or after. He was just concerned the the selling has not better. Please advise

## 2015-04-12 ENCOUNTER — Other Ambulatory Visit: Payer: Self-pay | Admitting: Emergency Medicine

## 2015-04-12 DIAGNOSIS — M1000 Idiopathic gout, unspecified site: Secondary | ICD-10-CM

## 2015-04-12 MED ORDER — PREDNISONE 10 MG PO TABS: ORAL_TABLET | ORAL | Status: DC

## 2015-04-12 NOTE — Telephone Encounter (Signed)
Advised pt

## 2015-04-12 NOTE — Telephone Encounter (Signed)
I have called in a low-dose of prednisone to take for the next week. This will tend to raise his sugar but his last A1c was 6.4 which is excellent. Hopefully that will take down the swelling.

## 2015-04-21 ENCOUNTER — Encounter: Payer: Self-pay | Admitting: Emergency Medicine

## 2015-04-21 ENCOUNTER — Ambulatory Visit (INDEPENDENT_AMBULATORY_CARE_PROVIDER_SITE_OTHER): Payer: Medicare Other | Admitting: Emergency Medicine

## 2015-04-21 VITALS — BP 111/58 | HR 53 | Temp 97.5°F | Resp 16 | Ht 67.75 in | Wt 166.8 lb

## 2015-04-21 DIAGNOSIS — Z8639 Personal history of other endocrine, nutritional and metabolic disease: Secondary | ICD-10-CM

## 2015-04-21 DIAGNOSIS — E1141 Type 2 diabetes mellitus with diabetic mononeuropathy: Secondary | ICD-10-CM

## 2015-04-21 DIAGNOSIS — I10 Essential (primary) hypertension: Secondary | ICD-10-CM

## 2015-04-21 DIAGNOSIS — M79671 Pain in right foot: Secondary | ICD-10-CM

## 2015-04-21 DIAGNOSIS — Z8739 Personal history of other diseases of the musculoskeletal system and connective tissue: Secondary | ICD-10-CM

## 2015-04-21 LAB — POCT URINALYSIS DIP (MANUAL ENTRY)
Bilirubin, UA: NEGATIVE
Blood, UA: NEGATIVE
Glucose, UA: NEGATIVE
Ketones, POC UA: NEGATIVE
Leukocytes, UA: NEGATIVE
Nitrite, UA: NEGATIVE
Protein Ur, POC: NEGATIVE
Spec Grav, UA: 1.02
Urobilinogen, UA: 0.2
pH, UA: 5.5

## 2015-04-21 LAB — URIC ACID: Uric Acid, Serum: 8.9 mg/dL — ABNORMAL HIGH (ref 4.0–7.8)

## 2015-04-21 LAB — GLUCOSE, POCT (MANUAL RESULT ENTRY): POC GLUCOSE: 127 mg/dL — AB (ref 70–99)

## 2015-04-21 LAB — POCT GLYCOSYLATED HEMOGLOBIN (HGB A1C): Hemoglobin A1C: 6.7

## 2015-04-21 NOTE — Progress Notes (Signed)
Patient ID: Benjamin Patel, male   DOB: July 15, 1927, 82 y.o.   MRN: [id number]    By signing my name below, I, Essence Howell, attest that this documentation has been prepared under the direction and in the presence of Darlyne Russian, MD Electronically Signed: Ladene Artist, ED Scribe 04/21/2015 at 9:46 AM.  Chief Complaint:  Chief Complaint  Patient presents with  . Follow-up  . Medication Refill   HPI: Benjamin Patel is a 82 y.o. male who reports to [facility] today for a follow-up appointment. Pt was recently seen in the office on 04/06/15 for gout in the right foot. Pt was started on colchicine and Norco which he states resolved right foot pain and swelling.  Lung CA Pt has a CAT scan next week.   Past Medical History  Diagnosis Date  . Mixed hyperlipidemia     takes Pravastatin daily  . Gout     no meds required  . Abnormal prostate exam   . Carotid stenosis   . Cholelithiasis   . Renal insufficiency   . Renal artery stenosis (HCC)     right  . PAC (premature atrial contraction)   . PVCs (premature ventricular contractions)   . Essential hypertension, benign     takes Clonodine,Losartan,and Metoprolol and HCTZ  . Carotid stenosis right side    Dr.Early does carotid ultrasounds yrly-last one Mar 14-report in epic  . Asthma     as a child  . COPD (chronic obstructive pulmonary disease) (HCC)     slight   . History of bronchitis   . Pneumonia 02/08/2012  . History of colon polyps   . Enlarged prostate     slightly   . Type II or unspecified type diabetes mellitus without mention of complication, not stated as uncontrolled     takes Metformin daily  . History of shingles   . Skin cancer     Lung Ca- Left Lung  . Arthritis     Gout   . Radiation 12/13/14-12/24/14    left upper lobe nodule 50 gray   Past Surgical History  Procedure Laterality Date  . No past surgeries    . Colonoscopy    . Video assisted thoracoscopy Left 06/16/2012    Procedure: VIDEO ASSISTED  THORACOSCOPY;  Surgeon: [name], MD;  Location: [facility];  Service: Thoracic;  Laterality: Left;  . Segmentecomy Left 06/16/2012    Procedure: left lower lobe superior SEGMENTECTOMY with node dissection;  Surgeon: [name], MD;  Location: [place];  Service: Thoracic;  Laterality: Left;  left superior   Social History   Social History  . Marital Status: Married    Spouse Name: N/A  . Number of Children: 3  . Years of Education: N/A   Occupational History  . retired     Herbalist   Social History Main Topics  . Smoking status: Former Smoker -- 1.50 packs/day for 30 years    Types: Cigarettes    Quit date: 01/16/1976  . Smokeless tobacco: Never Used  . Alcohol Use: No  . Drug Use: No  . Sexual Activity: Not Asked   Other Topics Concern  . None   Social History Narrative   Family History  Problem Relation Age of Onset  . Heart disease Mother   . Diabetes Mother     AKA  Right   . Hypertension Mother   . Varicose Veins Mother   . Kidney disease Mother  One Kidney  . Cancer Father     Lung   Allergies  Allergen Reactions  . Ace Inhibitors Cough  . Codeine Other (See Comments)    GI Upset  . Indomethacin Other (See Comments)    Elevated BP  . Losartan Swelling    ANGIOEDEMA/    Prior to Admission medications   Medication Sig Start Date End Date Taking? Authorizing Provider  cloNIDine (CATAPRES) 0.2 MG tablet Take 1 tablet (0.2 mg total) by mouth 2 (two) times daily. 08/17/14   Darlyne Russian, MD  colchicine 0.6 MG tablet Take 1 tablet (0.6 mg total) by mouth 2 (two) times daily. For gout (colcrys) 04/06/15   Tereasa Coop, PA-C  Flaxseed, Linseed, (FLAXSEED OIL PO) Take 1 tablet by mouth daily.    Historical Provider, MD  fluticasone (FLONASE) 50 MCG/ACT nasal spray Place 2 sprays into both nostrils daily. 12/23/14   Darlyne Russian, MD  gabapentin (NEURONTIN) 100 MG capsule Take 2 capsules up to 3 times a day 08/17/14   Darlyne Russian, MD    hydrochlorothiazide (HYDRODIURIL) 25 MG tablet Take 1 tablet (25 mg total) by mouth daily. 08/17/14   Darlyne Russian, MD  HYDROcodone-acetaminophen (NORCO) 10-325 MG tablet Take 0.5 tablets by mouth every 8 (eight) hours as needed. 04/06/15   Tereasa Coop, PA-C  metoprolol succinate (TOPROL-XL) 100 MG 24 hr tablet Take 1 tablet (100 mg total) by mouth daily. Take with or immediately following a meal. 08/17/14   Darlyne Russian, MD  pravastatin (PRAVACHOL) 20 MG tablet Take 1 tablet (20 mg total) by mouth at bedtime. 08/17/14   Darlyne Russian, MD  predniSONE (DELTASONE) 10 MG tablet 3 day for 3 days 2 a day for 3 days one a day for 3 days 04/12/15   Darlyne Russian, MD   ROS: The patient denies fevers, chills, night sweats, unintentional weight loss, chest pain, palpitations, wheezing, dyspnea on exertion, nausea, vomiting, abdominal pain, dysuria, hematuria, melena, -arthralgias, -joint swelling, numbness, weakness, or tingling.   All other systems have been reviewed and were otherwise negative with the exception of those mentioned in the HPI and as above.    PHYSICAL EXAM: Filed Vitals:   04/21/15 0911  BP: 111/58  Pulse: 53  Temp: 97.5 F (36.4 C)  Resp: 16   Body mass index is 25.54 kg/(m^2).  General: Alert, no acute distress HEENT:  Normocephalic, atraumatic, oropharynx patent. Eye: [name] Cardiovascular: Regular rate and rhythm, no rubs murmurs or gallops. No Carotid bruits, radial pulse intact. No pedal edema.  Respiratory: A few rhonchi on L upper anterior chest. No wheezes or rales. No cyanosis, no use of accessory musculature Abdominal: No organomegaly, abdomen is soft and non-tender, positive bowel sounds. No masses. Musculoskeletal: Gait intact. No edema, tenderness Skin: No rashes. Neurologic: Facial musculature symmetric. Psychiatric: Patient acts appropriately throughout our interaction. Lymphatic: No cervical or submandibular lymphadenopathy  LABS  Results for orders  placed or performed in visit on 04/21/15  POCT glucose (manual entry)  Result Value Ref Range   POC Glucose 127 (A) 70 - 99 mg/dl  POCT glycosylated hemoglobin (Hb A1C)  Result Value Ref Range   Hemoglobin A1C 6.7      EKG/XRAY:   Primary read interpreted by Dr. Everlene Farrier at [facility].  ASSESSMENT/PLAN: Sugars up slightly to 6.7. No changes made in medications. Patient is going to follow-up with Dr. Shelia Media as his PCP.I personally performed the services described in this documentation, which  was scribed in my presence. The recorded information has been reviewed and is accurate.   Gross sideeffects, risk and benefits, and alternatives of medications d/w patient. Patient is aware that all medications have potential sideeffects and we are unable to predict every sideeffect or drug-drug interaction that may occur.  Arlyss Queen MD 04/21/2015 9:35 AM

## 2015-04-21 NOTE — Addendum Note (Signed)
Addended by: Elie Confer on: 04/21/2015 10:36 AM   Modules accepted: Orders

## 2015-04-21 NOTE — Patient Instructions (Signed)
     IF you received an x-ray today, you will receive an invoice from [facility]. Please contact [facility] at [phone] with questions or concerns regarding your invoice.   IF you received labwork today, you will receive an invoice from [facility]. Please contact [facility] at [phone] with questions or concerns regarding your invoice.   Our billing staff will not be able to assist you with questions regarding bills from these companies.  You will be contacted with the lab results as soon as they are available. The fastest way to get your results is to activate your My Chart account. Instructions are located on the last page of this paperwork. If you have not heard from us regarding the results in 2 weeks, please contact this office.      

## 2015-04-28 ENCOUNTER — Ambulatory Visit
Admission: RE | Admit: 2015-04-28 | Discharge: 2015-04-28 | Disposition: A | Discharge status: Home / Self Care | Payer: Medicare Other | Source: Ambulatory Visit | Attending: Radiation Oncology | Admitting: Radiation Oncology

## 2015-04-28 ENCOUNTER — Encounter: Payer: Self-pay | Admitting: Radiation Oncology

## 2015-04-28 VITALS — BP 150/59 | HR 60 | Temp 97.5°F | Resp 18 | Ht 67.75 in | Wt 169.9 lb

## 2015-04-28 DIAGNOSIS — C3492 Malignant neoplasm of unspecified part of left bronchus or lung: Secondary | ICD-10-CM | POA: Diagnosis not present

## 2015-04-28 NOTE — Progress Notes (Signed)
Benjamin Patel here for follow up.  He denies having pain, shortness of breath, cough or fatigue.  BP 150/59 mmHg  Pulse 60  Temp(Src) 97.5 F (36.4 C) (Oral)  Resp 18  Ht 5' 7.75" (1.721 m)  Wt 169 lb 14.4 oz (77.066 kg)  BMI 26.02 kg/m2  SpO2 100%   Wt Readings from Last 3 Encounters:  04/28/15 169 lb 14.4 oz (77.066 kg)  04/21/15 166 lb 12.8 oz (75.66 kg)  04/06/15 175 lb (79.379 kg)

## 2015-04-28 NOTE — Progress Notes (Signed)
  Radiation Oncology         ([phone]) [phone] ________________________________  Name: Benjamin Patel MRN: [id number]  Date: 04/28/2015  DOB: 1927/04/11  Follow-Up Visit Note  CC: [name], MD  [name], *   Diagnosis: PET positive nodule in the left upper lobe    Interval Since Last Radiation: 4 months.  November 28 through December 24, 2014: 50 gray in 10 fractions  Narrative:  The patient returns today for routine follow-up. He denies pain, shortness of breath, cough or fatigue.  ALLERGIES:  is allergic to ace inhibitors; codeine; indomethacin; and losartan.  Meds: Current Outpatient Prescriptions  Medication Sig Dispense Refill  . cloNIDine (CATAPRES) 0.2 MG tablet Take 1 tablet (0.2 mg total) by mouth 2 (two) times daily. 60 tablet 11  . FLAXSEED, LINSEED, PO Take by mouth.    . fluticasone (FLONASE) 50 MCG/ACT nasal spray Place 2 sprays into both nostrils daily. 16 g 6  . gabapentin (NEURONTIN) 100 MG capsule Take 2 capsules up to 3 times a day 180 capsule 11  . hydrochlorothiazide (HYDRODIURIL) 25 MG tablet Take 1 tablet (25 mg total) by mouth daily. 30 tablet 11  . metoprolol succinate (TOPROL-XL) 100 MG 24 hr tablet Take 1 tablet (100 mg total) by mouth daily. Take with or immediately following a meal. 30 tablet 11  . pravastatin (PRAVACHOL) 20 MG tablet Take 1 tablet (20 mg total) by mouth at bedtime. 30 tablet 11  . colchicine 0.6 MG tablet Take 1 tablet (0.6 mg total) by mouth 2 (two) times daily. For gout (colcrys) (Patient not taking: Reported on 04/21/2015) 12 tablet 0  . HYDROcodone-acetaminophen (NORCO) 10-325 MG tablet Take 0.5 tablets by mouth every 8 (eight) hours as needed. (Patient not taking: Reported on 04/21/2015) 6 tablet 0  . predniSONE (DELTASONE) 10 MG tablet 3 day for 3 days 2 a day for 3 days one a day for 3 days (Patient not taking: Reported on 04/21/2015) 18 tablet 0   No current facility-administered medications for this encounter.     Physical Findings: The patient is in no acute distress. Patient is alert and oriented.  height is 5' 7.75" (1.721 m) and weight is 169 lb 14.4 oz (77.066 kg). His oral temperature is 97.5 F (36.4 C). His blood pressure is 150/59 and his pulse is 60. His respiration is 18 and oxygen saturation is 100%.   Lungs are clear to auscultation bilaterally. Heart has regular rate and rhythm. No palpable cervical, supraclavicular, or axillary adenopathy.  Lab Findings: Lab Results  Component Value Date   WBC 9.5 11/16/2014   HGB 14.8 11/16/2014   HCT 43.8 11/16/2014   MCV 89.0 11/16/2014   PLT 219 11/16/2014    Radiographic Findings: No results found.  Impression: The patient is doing well this time without any appreciable side effects from his treatment. No signs of recurrence on clinical exam.  Plan:  The patient will be scheduled for a CT scan. Follow up with radiation oncology in 3 months. The patient will be transferring his primary care to Dr. Deland Pretty. ____________________________________  Blair Promise, PhD, MD  This document serves as a record of services personally performed by Gery Pray, MD. It was created on his behalf by Darcus Austin, a trained medical scribe. The creation of this record is based on the scribe's personal observations and the provider's statements to them. This document has been checked and approved by the attending provider.

## 2015-05-02 ENCOUNTER — Telehealth: Payer: Self-pay | Admitting: *Deleted

## 2015-05-02 NOTE — Telephone Encounter (Signed)
CALLED PATIENT TO INFORM OF CT AND FU, SPOKE WITH PATIENT'S WIFE AND SHE IS AWARE OF THESE APPTS.

## 2015-05-04 DIAGNOSIS — M1020 Drug-induced gout, unspecified site: Secondary | ICD-10-CM | POA: Diagnosis not present

## 2015-05-04 DIAGNOSIS — E1140 Type 2 diabetes mellitus with diabetic neuropathy, unspecified: Secondary | ICD-10-CM | POA: Diagnosis not present

## 2015-05-04 DIAGNOSIS — I10 Essential (primary) hypertension: Secondary | ICD-10-CM | POA: Diagnosis not present

## 2015-05-04 DIAGNOSIS — Z85118 Personal history of other malignant neoplasm of bronchus and lung: Secondary | ICD-10-CM | POA: Diagnosis not present

## 2015-05-04 DIAGNOSIS — E7800 Pure hypercholesterolemia, unspecified: Secondary | ICD-10-CM | POA: Diagnosis not present

## 2015-05-04 DIAGNOSIS — I6523 Occlusion and stenosis of bilateral carotid arteries: Secondary | ICD-10-CM | POA: Diagnosis not present

## 2015-05-05 ENCOUNTER — Ambulatory Visit: Payer: Self-pay | Admitting: Radiation Oncology

## 2015-05-05 DIAGNOSIS — E1140 Type 2 diabetes mellitus with diabetic neuropathy, unspecified: Secondary | ICD-10-CM | POA: Diagnosis not present

## 2015-05-16 DIAGNOSIS — E1140 Type 2 diabetes mellitus with diabetic neuropathy, unspecified: Secondary | ICD-10-CM | POA: Diagnosis not present

## 2015-05-27 ENCOUNTER — Encounter: Payer: Self-pay | Admitting: Family

## 2015-06-03 ENCOUNTER — Ambulatory Visit (HOSPITAL_COMMUNITY)
Admission: RE | Admit: 2015-06-03 | Discharge: 2015-06-03 | Disposition: A | Discharge status: Home / Self Care | Payer: Medicare Other | Source: Ambulatory Visit | Attending: Family | Admitting: Family

## 2015-06-03 ENCOUNTER — Encounter: Payer: Self-pay | Admitting: Family

## 2015-06-03 ENCOUNTER — Ambulatory Visit (INDEPENDENT_AMBULATORY_CARE_PROVIDER_SITE_OTHER): Payer: Medicare Other | Admitting: Family

## 2015-06-03 VITALS — BP 132/68 | HR 52 | Temp 97.9°F | Resp 14 | Ht 67.5 in | Wt 172.0 lb

## 2015-06-03 DIAGNOSIS — I6523 Occlusion and stenosis of bilateral carotid arteries: Secondary | ICD-10-CM | POA: Insufficient documentation

## 2015-06-03 NOTE — Patient Instructions (Signed)
Stroke Prevention Some medical conditions and behaviors are associated with an increased chance of having a stroke. You may prevent a stroke by making healthy choices and managing medical conditions. HOW CAN I REDUCE MY RISK OF HAVING A STROKE?   Stay physically active. Get at least 30 minutes of activity on most or all days.  Do not smoke. It may also be helpful to avoid exposure to secondhand smoke.  Limit alcohol use. Moderate alcohol use is considered to be:  No more than 2 drinks per day for men.  No more than 1 drink per day for nonpregnant women.  Eat healthy foods. This involves:  Eating 5 or more servings of fruits and vegetables a day.  Making dietary changes that address high blood pressure (hypertension), high cholesterol, diabetes, or obesity.  Manage your cholesterol levels.  Making food choices that are high in fiber and low in saturated fat, trans fat, and cholesterol may control cholesterol levels.  Take any prescribed medicines to control cholesterol as directed by your health care provider.  Manage your diabetes.  Controlling your carbohydrate and sugar intake is recommended to manage diabetes.  Take any prescribed medicines to control diabetes as directed by your health care provider.  Control your hypertension.  Making food choices that are low in salt (sodium), saturated fat, trans fat, and cholesterol is recommended to manage hypertension.  Ask your health care provider if you need treatment to lower your blood pressure. Take any prescribed medicines to control hypertension as directed by your health care provider.  If you are 18-39 years of age, have your blood pressure checked every 3-5 years. If you are 40 years of age or older, have your blood pressure checked every year.  Maintain a healthy weight.  Reducing calorie intake and making food choices that are low in sodium, saturated fat, trans fat, and cholesterol are recommended to manage  weight.  Stop drug abuse.  Avoid taking birth control pills.  Talk to your health care provider about the risks of taking birth control pills if you are over 35 years old, smoke, get migraines, or have ever had a blood clot.  Get evaluated for sleep disorders (sleep apnea).  Talk to your health care provider about getting a sleep evaluation if you snore a lot or have excessive sleepiness.  Take medicines only as directed by your health care provider.  For some people, aspirin or blood thinners (anticoagulants) are helpful in reducing the risk of forming abnormal blood clots that can lead to stroke. If you have the irregular heart rhythm of atrial fibrillation, you should be on a blood thinner unless there is a good reason you cannot take them.  Understand all your medicine instructions.  Make sure that other conditions (such as anemia or atherosclerosis) are addressed. SEEK IMMEDIATE MEDICAL CARE IF:   You have sudden weakness or numbness of the face, arm, or leg, especially on one side of the body.  Your face or eyelid droops to one side.  You have sudden confusion.  You have trouble speaking (aphasia) or understanding.  You have sudden trouble seeing in one or both eyes.  You have sudden trouble walking.  You have dizziness.  You have a loss of balance or coordination.  You have a sudden, severe headache with no known cause.  You have new chest pain or an irregular heartbeat. Any of these symptoms may represent a serious problem that is an emergency. Do not wait to see if the symptoms will   go away. Get medical help at once. Call your local emergency services (911 in [place] Do not drive yourself to the hospital.   This information is not intended to replace advice given to you by your health care provider. Make sure you discuss any questions you have with your health care provider.   Document Released: 02/09/2004 Document Revised: 01/22/2014 Document Reviewed:  07/04/2012 Elsevier Interactive Patient Education 2016 [employer]  

## 2015-06-03 NOTE — Progress Notes (Addendum)
Chief Complaint: Follow up Extracranial Carotid Artery Stenosis   History of Present Illness  Benjamin Patel is a 82 y.o. male patient of Dr. Donnetta Hutching who has known extracranial carotid artery stenosis. He returns today for routine surveillance.  Patient has not had previous carotid artery intervention.  The patient denies any history of TIA or stroke symptoms.Specifically he denies a history of amaurosis fugax or monocular blindness, unilateral facial drooping, hemiplegia, or receptive or expressive aphasia.  Pt denies tingling, numbness, pain, or cold sensation in either hand or arm.  He denies non healing wounds, denies claudication symptoms with walking.   Patient reports New Medical or Surgical History: he had 10 days of radiation tx in December 2016 directed at a small lesion in his lung, questionable whether this was cancer per pt. He will have a CT follow up in July 2017.  Pt would like my progress note sent to Dr. Roxan Hockey, his thoracic surgeon, Dr. Marlou Porch, his cardiologist, in addition to his PCP; Dr. Deland Pretty is his new PCP.   He had a wedge resection left lung for cancer, June, 2014.  Pt Diabetic: Yes, he states his last A1C was 6.0  Pt smoker: former smoker, quit in 1978  Pt meds include: Statin : Yes ASA: no, stopped by his cardiologist per pt Other anticoagulants/antiplatelets: no    Past Medical History  Diagnosis Date  . Mixed hyperlipidemia     takes Pravastatin daily  . Gout     no meds required  . Abnormal prostate exam   . Carotid stenosis   . Cholelithiasis   . Renal insufficiency   . Renal artery stenosis (HCC)     right  . PAC (premature atrial contraction)   . PVCs (premature ventricular contractions)   . Essential hypertension, benign     takes Clonodine,Losartan,and Metoprolol and HCTZ  . Carotid stenosis right side    Dr.Early does carotid ultrasounds yrly-last one Mar 14-report in epic  . Asthma     as a child  . COPD (chronic  obstructive pulmonary disease) (HCC)     slight   . History of bronchitis   . Pneumonia 02/08/2012  . History of colon polyps   . Enlarged prostate     slightly   . Type II or unspecified type diabetes mellitus without mention of complication, not stated as uncontrolled     takes Metformin daily  . History of shingles   . Skin cancer     Lung Ca- Left Lung  . Arthritis     Gout   . Radiation 12/13/14-12/24/14    left upper lobe nodule 50 gray    Social History Social History  Substance Use Topics  . Smoking status: Former Smoker -- 1.50 packs/day for 30 years    Types: Cigarettes    Quit date: 01/16/1976  . Smokeless tobacco: Never Used  . Alcohol Use: No    Family History Family History  Problem Relation Age of Onset  . Heart disease Mother   . Diabetes Mother     AKA  Right   . Hypertension Mother   . Varicose Veins Mother   . Kidney disease Mother     One Kidney  . Cancer Father     Lung    Surgical History Past Surgical History  Procedure Laterality Date  . No past surgeries    . Colonoscopy    . Video assisted thoracoscopy Left 06/16/2012    Procedure: VIDEO ASSISTED THORACOSCOPY;  Surgeon:  [name], MD;  Location: [facility];  Service: Thoracic;  Laterality: Left;  . Segmentecomy Left 06/16/2012    Procedure: left lower lobe superior SEGMENTECTOMY with node dissection;  Surgeon: [name], MD;  Location: [facility];  Service: Thoracic;  Laterality: Left;  left superior    Allergies  Allergen Reactions  . Ace Inhibitors Cough  . Codeine Other (See Comments)    GI Upset  . Indomethacin Other (See Comments)    Elevated BP  . Losartan Swelling    ANGIOEDEMA/     Current Outpatient Prescriptions  Medication Sig Dispense Refill  . cloNIDine (CATAPRES) 0.2 MG tablet Take 1 tablet (0.2 mg total) by mouth 2 (two) times daily. 60 tablet 11  . FLAXSEED, LINSEED, PO Take by mouth.    . gabapentin (NEURONTIN) 100 MG capsule Take 2 capsules up to 3  times a day 180 capsule 11  . metoprolol succinate (TOPROL-XL) 100 MG 24 hr tablet Take 1 tablet (100 mg total) by mouth daily. Take with or immediately following a meal. 30 tablet 11  . rosuvastatin (CRESTOR) 10 MG tablet     . fluticasone (FLONASE) 50 MCG/ACT nasal spray Place 2 sprays into both nostrils daily. (Patient not taking: Reported on 06/03/2015) 16 g 6  . hydrochlorothiazide (HYDRODIURIL) 25 MG tablet Take 1 tablet (25 mg total) by mouth daily. (Patient not taking: Reported on 06/03/2015) 30 tablet 11  . HYDROcodone-acetaminophen (NORCO) 10-325 MG tablet Take 0.5 tablets by mouth every 8 (eight) hours as needed. (Patient not taking: Reported on 04/21/2015) 6 tablet 0  . predniSONE (DELTASONE) 10 MG tablet 3 day for 3 days 2 a day for 3 days one a day for 3 days (Patient not taking: Reported on 04/21/2015) 18 tablet 0   No current facility-administered medications for this visit.    Review of Systems : See HPI for pertinent positives and negatives.  Physical Examination  Filed Vitals:   06/03/15 1328  BP: 132/68  Pulse: 52  Temp: 97.9 F (36.6 C)  TempSrc: Oral  Resp: 14  Height: 5' 7.5" (1.715 m)  Weight: 172 lb (78.019 kg)  SpO2: 94%   Body mass index is 26.53 kg/(m^2).  General: WDWN male in NAD GAIT: normal Eyes: PERRLA Pulmonary: Respirations are non-labored, CTAB Cardiac: regular rhythm, no detected murmur.  VASCULAR EXAM Carotid Bruits Left Right   Positive(harsh) Positive(soft)   Aorta is not palpable. Radial pulses are 2+ palpable and equal.      LE Pulses LEFT RIGHT   POPLITEAL not palpable  not palpable   POSTERIOR TIBIAL  faintly palpable   faintly palpable    DORSALIS PEDIS  ANTERIOR TIBIAL faintly palpable  faintly palpable     Gastrointestinal: soft,  nontender, BS WNL, no r/g,no palpable masses.  Musculoskeletal: No muscle atrophy/wasting. M/S 5/5 throughout, Extremities without ischemic changes.  Neurologic: A&O X 3; Appropriate Affect,  Speech is normal CN 2-12 intact, Pain and light touch intact in extremities, Motor exam as listed above.                Non-Invasive Vascular Imaging CAROTID DUPLEX 06/03/2015   Bilateral ICA stenosis is <40% Bilateral distal CCA stenosis, right >50% Right vertebral artery is not visualized, left is antegrade. Multiphasic bilateral subclavian arteries. No significant change compared to 09/28/14.   Assessment: Benjamin Patel is a 82 y.o. male who has no history of stroke or TIA. He has no steal symptoms in his upper extremities. Today's carotid Duplex  suggests bilateral internal carotid artery stenosis present in the less than 40%. Bilateral distal CCA stenosis, right >50% Right vertebral artery is not visualized, left is antegrade. Multiphasic bilateral subclavian arteries. No significant change compared to 09/28/14.    Plan: Follow-up in 1 year with Carotid Duplex scan.   I discussed in depth with the patient the nature of atherosclerosis, and emphasized the importance of maximal medical management including strict control of blood pressure, blood glucose, and lipid levels, obtaining regular exercise, and continued cessation of smoking.  The patient is aware that without maximal medical management the underlying atherosclerotic disease process will progress, limiting the benefit of any interventions. The patient was given information about stroke prevention and what symptoms should prompt the patient to seek immediate medical care. Thank you for allowing [place] to participate in this patient's care.  Clemon Chambers, RN, MSN, FNP-C [facility] Office: [facility] Physician: Bridgett Larsson  06/03/2015 1:53 PM

## 2015-06-30 DIAGNOSIS — E7800 Pure hypercholesterolemia, unspecified: Secondary | ICD-10-CM | POA: Diagnosis not present

## 2015-07-04 DIAGNOSIS — I499 Cardiac arrhythmia, unspecified: Secondary | ICD-10-CM | POA: Diagnosis not present

## 2015-07-04 DIAGNOSIS — E7800 Pure hypercholesterolemia, unspecified: Secondary | ICD-10-CM | POA: Diagnosis not present

## 2015-07-14 DIAGNOSIS — L259 Unspecified contact dermatitis, unspecified cause: Secondary | ICD-10-CM | POA: Diagnosis not present

## 2015-08-03 ENCOUNTER — Ambulatory Visit (HOSPITAL_COMMUNITY): Admission: RE | Admit: 2015-08-03 | Payer: Medicare Other | Source: Ambulatory Visit

## 2015-08-03 ENCOUNTER — Encounter (HOSPITAL_COMMUNITY): Payer: Self-pay

## 2015-08-03 ENCOUNTER — Ambulatory Visit (HOSPITAL_COMMUNITY)
Admission: RE | Admit: 2015-08-03 | Discharge: 2015-08-03 | Disposition: A | Discharge status: Home / Self Care | Payer: Medicare Other | Source: Ambulatory Visit | Attending: Radiation Oncology | Admitting: Radiation Oncology

## 2015-08-03 DIAGNOSIS — C3492 Malignant neoplasm of unspecified part of left bronchus or lung: Secondary | ICD-10-CM | POA: Diagnosis not present

## 2015-08-03 DIAGNOSIS — Z9889 Other specified postprocedural states: Secondary | ICD-10-CM | POA: Diagnosis not present

## 2015-08-03 DIAGNOSIS — C3490 Malignant neoplasm of unspecified part of unspecified bronchus or lung: Secondary | ICD-10-CM | POA: Diagnosis not present

## 2015-08-03 HISTORY — DX: Malignant neoplasm of unspecified part of unspecified bronchus or lung: C34.90

## 2015-08-04 ENCOUNTER — Encounter: Payer: Self-pay | Admitting: Radiation Oncology

## 2015-08-04 ENCOUNTER — Ambulatory Visit
Admission: RE | Admit: 2015-08-04 | Discharge: 2015-08-04 | Disposition: A | Discharge status: Home / Self Care | Payer: Medicare Other | Source: Ambulatory Visit | Attending: Radiation Oncology | Admitting: Radiation Oncology

## 2015-08-04 ENCOUNTER — Telehealth: Payer: Self-pay | Admitting: *Deleted

## 2015-08-04 VITALS — BP 159/56 | HR 51 | Temp 97.6°F | Ht 67.5 in | Wt 177.7 lb

## 2015-08-04 DIAGNOSIS — C3412 Malignant neoplasm of upper lobe, left bronchus or lung: Secondary | ICD-10-CM | POA: Diagnosis not present

## 2015-08-04 DIAGNOSIS — I2510 Atherosclerotic heart disease of native coronary artery without angina pectoris: Secondary | ICD-10-CM | POA: Insufficient documentation

## 2015-08-04 DIAGNOSIS — Z923 Personal history of irradiation: Secondary | ICD-10-CM | POA: Diagnosis not present

## 2015-08-04 DIAGNOSIS — Z08 Encounter for follow-up examination after completed treatment for malignant neoplasm: Secondary | ICD-10-CM | POA: Diagnosis not present

## 2015-08-04 DIAGNOSIS — K8020 Calculus of gallbladder without cholecystitis without obstruction: Secondary | ICD-10-CM | POA: Diagnosis not present

## 2015-08-04 DIAGNOSIS — R911 Solitary pulmonary nodule: Secondary | ICD-10-CM | POA: Diagnosis not present

## 2015-08-04 DIAGNOSIS — C3492 Malignant neoplasm of unspecified part of left bronchus or lung: Secondary | ICD-10-CM

## 2015-08-04 NOTE — Telephone Encounter (Signed)
CALLED PATIENT TO INFORM OF SIX MONTH FU, LVM FOR A RETURN CALL

## 2015-08-04 NOTE — Progress Notes (Signed)
Rosine Beat here for follow up.  He denies having pain.  He continues to report having shortness of breath.  He denies having a cough or hemoptysis.  He reports his energy level is "mediocre."  He reports having a good appetite.  He had a CT scan of his chest yesterday.  BP 159/56 mmHg  Pulse 51  Temp(Src) 97.6 F (36.4 C) (Oral)  Ht 5' 7.5" (1.715 m)  Wt 177 lb 11.2 oz (80.604 kg)  BMI 27.40 kg/m2  SpO2 95%   Wt Readings from Last 3 Encounters:  08/04/15 177 lb 11.2 oz (80.604 kg)  06/03/15 172 lb (78.019 kg)  04/28/15 169 lb 14.4 oz (77.066 kg)

## 2015-08-04 NOTE — Progress Notes (Signed)
Radiation Oncology         [phone] ________________________________  Name: Benjamin Patel MRN: [id number]  Date: 08/04/2015  DOB: 02-10-1927    Follow-Up Visit Note  CC: Horatio Pel, MD  [name], *  Diagnosis: PET positive nodule in the left upper lobe    Interval Since Last Radiation: 7 months.  November 28 through December 24, 2014: 50 gray in 10 fractions  Narrative:  The patient returns today for routine follow-up. Rosine Beat here for follow up. He denies having pain. He continues to report having shortness of breath. He denies having a cough or hemoptysis. He reports his energy level is "mediocre." He reports having a good appetite. He had a CT scan of his chest yesterday.  ALLERGIES:  is allergic to ace inhibitors; codeine; indomethacin; and losartan.  Meds: Current Outpatient Prescriptions  Medication Sig Dispense Refill  . cloNIDine (CATAPRES) 0.2 MG tablet Take 1 tablet (0.2 mg total) by mouth 2 (two) times daily. 60 tablet 11  . FLAXSEED, LINSEED, PO Take by mouth.    . gabapentin (NEURONTIN) 100 MG capsule Take 2 capsules up to 3 times a day 180 capsule 11  . metoprolol succinate (TOPROL-XL) 100 MG 24 hr tablet Take 1 tablet (100 mg total) by mouth daily. Take with or immediately following a meal. 30 tablet 11  . rosuvastatin (CRESTOR) 10 MG tablet     . fluticasone (FLONASE) 50 MCG/ACT nasal spray Place 2 sprays into both nostrils daily. (Patient not taking: Reported on 06/03/2015) 16 g 6  . HYDROcodone-acetaminophen (NORCO) 10-325 MG tablet Take 0.5 tablets by mouth every 8 (eight) hours as needed. (Patient not taking: Reported on 04/21/2015) 6 tablet 0   No current facility-administered medications for this encounter.    Physical Findings: The patient is in no acute distress. Patient is alert and oriented.  height is 5' 7.5" (1.715 m) and weight is 177 lb 11.2 oz (80.604 kg). His oral temperature is 97.6 F (36.4 C). His blood  pressure is 159/56 and his pulse is 51. His oxygen saturation is 95%.   The heart has a regular rhythm and rate. The lungs are clear to auscultation bilaterally. No palpable supraclavicular, cervical, or axillary adenopathy.  Lab Findings: Lab Results  Component Value Date   WBC 9.5 11/16/2014   HGB 14.8 11/16/2014   HCT 43.8 11/16/2014   MCV 89.0 11/16/2014   PLT 219 11/16/2014    Radiographic Findings: Ct Chest Wo Contrast  08/03/2015  CLINICAL DATA:  Follow-up lung cancer, status post left lower lobe wedge resection in 2014, status post SBRT to the left lung apex in 2016 EXAM: CT CHEST WITHOUT CONTRAST TECHNIQUE: Multidetector CT imaging of the chest was performed following the standard protocol without IV contrast. COMPARISON:  PET-CT dated 11/10/2014 FINDINGS: Cardiovascular: Heart is top-normal in size. No pericardial effusion. Three vessel coronary atherosclerosis. Atherosclerotic calcifications of the aortic arch. Mediastinum/Nodes: Small mediastinal lymph nodes measuring up to 6 mm short axis, within normal limits. No suspicious axillary lymphadenopathy. Visualized thyroid is unremarkable. Lungs/Pleura: Postsurgical changes related to left lower lobe wedge resection. Residual nodular thickening at the anterior left lung apex, measuring 7 mm status post SBRT (series 5/image 14), previously 1.6 cm. No new/ suspicious pulmonary nodules. Mild linear scarring in the lingula and left lower lobe. Moderate centrilobular and paraseptal emphysematous changes, upper lobe predominant. No focal consolidation. No pleural effusion or pneumothorax. Upper Abdomen: Visualized upper abdomen is notable for vascular calcifications and cholelithiasis. Musculoskeletal: Degenerative  changes of the visualized thoracolumbar spine. IMPRESSION: Postsurgical changes related to left lower lobe wedge resection. Residual 7 mm nodular thickening at the anterior left lung apex, decreased status post SBRT. No evidence of  metastatic disease in the chest. Electronically Signed   By: Julian Hy M.D.   On: 08/03/2015 15:48    Impression: The patient is doing well this time without any appreciable side effects from his treatment. No signs of recurrence on clinical exam today. Chest CT scan yesterday encouraging.  Plan:  Routine follow up in 6 months. He will have a CT scan prior to his follow up appointment. ____________________________________  Blair Promise, PhD, MD    This document serves as a record of services personally performed by Gery Pray, MD. It was created on his behalf by Lendon Collar, a trained medical scribe. The creation of this record is based on the scribe's personal observations and the provider's statements to them. This document has been checked and approved by the attending provider.

## 2015-08-04 NOTE — Addendum Note (Signed)
Encounter addended by: Jacqulyn Liner, RN on: 08/04/2015  2:17 PM<BR>     Documentation filed: Charges VN

## 2015-08-16 ENCOUNTER — Other Ambulatory Visit: Payer: Self-pay | Admitting: Family

## 2015-08-16 DIAGNOSIS — I6523 Occlusion and stenosis of bilateral carotid arteries: Secondary | ICD-10-CM

## 2015-08-24 ENCOUNTER — Other Ambulatory Visit: Payer: Self-pay | Admitting: Emergency Medicine

## 2015-08-24 DIAGNOSIS — I10 Essential (primary) hypertension: Secondary | ICD-10-CM

## 2015-08-29 DIAGNOSIS — L570 Actinic keratosis: Secondary | ICD-10-CM | POA: Diagnosis not present

## 2015-09-01 ENCOUNTER — Other Ambulatory Visit: Payer: Self-pay | Admitting: Emergency Medicine

## 2015-09-01 DIAGNOSIS — E1142 Type 2 diabetes mellitus with diabetic polyneuropathy: Secondary | ICD-10-CM

## 2015-10-05 ENCOUNTER — Other Ambulatory Visit: Payer: Self-pay | Admitting: Emergency Medicine

## 2015-10-05 DIAGNOSIS — E1142 Type 2 diabetes mellitus with diabetic polyneuropathy: Secondary | ICD-10-CM

## 2015-10-13 DIAGNOSIS — H2512 Age-related nuclear cataract, left eye: Secondary | ICD-10-CM | POA: Diagnosis not present

## 2015-10-13 DIAGNOSIS — E119 Type 2 diabetes mellitus without complications: Secondary | ICD-10-CM | POA: Diagnosis not present

## 2015-10-13 DIAGNOSIS — D3132 Benign neoplasm of left choroid: Secondary | ICD-10-CM | POA: Diagnosis not present

## 2015-10-13 DIAGNOSIS — H2511 Age-related nuclear cataract, right eye: Secondary | ICD-10-CM | POA: Diagnosis not present

## 2015-10-28 DIAGNOSIS — Z23 Encounter for immunization: Secondary | ICD-10-CM | POA: Diagnosis not present

## 2015-11-07 DIAGNOSIS — E1140 Type 2 diabetes mellitus with diabetic neuropathy, unspecified: Secondary | ICD-10-CM | POA: Diagnosis not present

## 2015-11-22 ENCOUNTER — Other Ambulatory Visit: Payer: Self-pay | Admitting: Emergency Medicine

## 2015-11-22 DIAGNOSIS — I10 Essential (primary) hypertension: Secondary | ICD-10-CM

## 2015-11-24 ENCOUNTER — Other Ambulatory Visit: Payer: Self-pay | Admitting: Emergency Medicine

## 2015-11-24 DIAGNOSIS — I10 Essential (primary) hypertension: Secondary | ICD-10-CM

## 2016-01-04 ENCOUNTER — Other Ambulatory Visit: Payer: Self-pay | Admitting: Emergency Medicine

## 2016-01-04 DIAGNOSIS — E1142 Type 2 diabetes mellitus with diabetic polyneuropathy: Secondary | ICD-10-CM

## 2016-01-06 ENCOUNTER — Other Ambulatory Visit: Payer: Self-pay

## 2016-01-06 MED ORDER — GABAPENTIN 100 MG PO CAPS: ORAL_CAPSULE | ORAL | 11 refills | Status: DC

## 2016-01-06 NOTE — Telephone Encounter (Signed)
Fax request Gabapentin '100mg'$  from [facility] per Dr. Everlene Farrier - 1 yr supply

## 2016-01-10 ENCOUNTER — Telehealth: Payer: Self-pay | Admitting: *Deleted

## 2016-01-10 NOTE — Telephone Encounter (Signed)
CALLED PATIENT TO INFORM OF CT FOR 01-31-16- ARRIVAL TIME - 11:15 AM, NO PREP FOR THIS TEST DUE TO NO CONTRAST @ [facility] AND HIS FU VISIT WITH DR. KINARD ON 02-02-16 @ 10:20 AM, SPOKE WITH PATIENT AND HE IS AWARE OF THESE APPTS.

## 2016-01-18 DIAGNOSIS — M79604 Pain in right leg: Secondary | ICD-10-CM | POA: Diagnosis not present

## 2016-01-31 ENCOUNTER — Ambulatory Visit (HOSPITAL_COMMUNITY)
Admission: RE | Admit: 2016-01-31 | Discharge: 2016-01-31 | Disposition: A | Discharge status: Home / Self Care | Payer: Medicare Other | Source: Ambulatory Visit | Attending: Radiation Oncology | Admitting: Radiation Oncology

## 2016-01-31 DIAGNOSIS — I2510 Atherosclerotic heart disease of native coronary artery without angina pectoris: Secondary | ICD-10-CM | POA: Diagnosis not present

## 2016-01-31 DIAGNOSIS — N200 Calculus of kidney: Secondary | ICD-10-CM | POA: Insufficient documentation

## 2016-01-31 DIAGNOSIS — Z9889 Other specified postprocedural states: Secondary | ICD-10-CM | POA: Insufficient documentation

## 2016-01-31 DIAGNOSIS — I700 Atherosclerosis of aorta: Secondary | ICD-10-CM | POA: Insufficient documentation

## 2016-01-31 DIAGNOSIS — C3432 Malignant neoplasm of lower lobe, left bronchus or lung: Secondary | ICD-10-CM | POA: Diagnosis not present

## 2016-01-31 DIAGNOSIS — K8020 Calculus of gallbladder without cholecystitis without obstruction: Secondary | ICD-10-CM | POA: Diagnosis not present

## 2016-01-31 DIAGNOSIS — C3492 Malignant neoplasm of unspecified part of left bronchus or lung: Secondary | ICD-10-CM | POA: Diagnosis not present

## 2016-01-31 DIAGNOSIS — J439 Emphysema, unspecified: Secondary | ICD-10-CM | POA: Diagnosis not present

## 2016-02-02 ENCOUNTER — Ambulatory Visit: Payer: Medicare Other | Admitting: Radiation Oncology

## 2016-02-03 ENCOUNTER — Ambulatory Visit: Payer: Medicare Other | Admitting: Diagnostic Neuroimaging

## 2016-02-03 ENCOUNTER — Telehealth: Payer: Self-pay | Admitting: *Deleted

## 2016-02-03 NOTE — Telephone Encounter (Signed)
Spoke with patient and rescheduled his new patient appt today due to snow. Patient verbalized understanding, appreciation of call and new appt date/arrival time.

## 2016-02-13 ENCOUNTER — Encounter: Payer: Self-pay | Admitting: Radiation Oncology

## 2016-02-13 ENCOUNTER — Ambulatory Visit
Admission: RE | Admit: 2016-02-13 | Discharge: 2016-02-13 | Disposition: A | Discharge status: Home / Self Care | Payer: Medicare Other | Source: Ambulatory Visit | Attending: Radiation Oncology | Admitting: Radiation Oncology

## 2016-02-13 VITALS — BP 200/84 | HR 60 | Temp 97.4°F | Ht 67.5 in | Wt 176.6 lb

## 2016-02-13 DIAGNOSIS — Z85118 Personal history of other malignant neoplasm of bronchus and lung: Secondary | ICD-10-CM | POA: Diagnosis not present

## 2016-02-13 DIAGNOSIS — I700 Atherosclerosis of aorta: Secondary | ICD-10-CM | POA: Insufficient documentation

## 2016-02-13 DIAGNOSIS — Z79899 Other long term (current) drug therapy: Secondary | ICD-10-CM | POA: Insufficient documentation

## 2016-02-13 DIAGNOSIS — R911 Solitary pulmonary nodule: Secondary | ICD-10-CM | POA: Diagnosis not present

## 2016-02-13 DIAGNOSIS — Z923 Personal history of irradiation: Secondary | ICD-10-CM | POA: Diagnosis not present

## 2016-02-13 DIAGNOSIS — N200 Calculus of kidney: Secondary | ICD-10-CM | POA: Diagnosis not present

## 2016-02-13 DIAGNOSIS — Z9889 Other specified postprocedural states: Secondary | ICD-10-CM | POA: Diagnosis not present

## 2016-02-13 DIAGNOSIS — Z08 Encounter for follow-up examination after completed treatment for malignant neoplasm: Secondary | ICD-10-CM | POA: Diagnosis not present

## 2016-02-13 DIAGNOSIS — I2510 Atherosclerotic heart disease of native coronary artery without angina pectoris: Secondary | ICD-10-CM | POA: Insufficient documentation

## 2016-02-13 DIAGNOSIS — K8020 Calculus of gallbladder without cholecystitis without obstruction: Secondary | ICD-10-CM | POA: Insufficient documentation

## 2016-02-13 DIAGNOSIS — R0602 Shortness of breath: Secondary | ICD-10-CM | POA: Insufficient documentation

## 2016-02-13 DIAGNOSIS — Z888 Allergy status to other drugs, medicaments and biological substances status: Secondary | ICD-10-CM | POA: Insufficient documentation

## 2016-02-13 DIAGNOSIS — C3492 Malignant neoplasm of unspecified part of left bronchus or lung: Secondary | ICD-10-CM

## 2016-02-13 NOTE — Progress Notes (Signed)
Radiation Oncology         [phone] ________________________________  Name: Benjamin Patel MRN: [id number]  Date: 02/13/2016  DOB: 1927-11-08     Follow-Up Visit Note  CC: Horatio Pel, MD  [name], *  Diagnosis: PET positive nodule in the left upper lobe    Interval Since Last Radiation: 1 year 2 months  November 28 through December 24, 2014: 50 gray in 10 fractions  Narrative:  The patient returns today for routine follow-up. He denies having any pain. He continues to have shortness of breath. He denies having a cough. He reports having a poor energy level. His BP was elevated today.  ALLERGIES:  is allergic to ace inhibitors; codeine; indomethacin; and losartan.  Meds: Current Outpatient Prescriptions  Medication Sig Dispense Refill  . cloNIDine (CATAPRES) 0.2 MG tablet TAKE ONE TABLET BY MOUTH TWICE DAILY 180 tablet 0  . FLAXSEED, LINSEED, PO Take by mouth.    . gabapentin (NEURONTIN) 100 MG capsule TAKE TWO CAPSULES BY MOUTH UP TO THREE TIMES A DAY 180 capsule 11  . metoprolol succinate (TOPROL-XL) 100 MG 24 hr tablet Take 1 tablet (100 mg total) by mouth daily. Take with or immediately following a meal. 30 tablet 11  . rosuvastatin (CRESTOR) 10 MG tablet     . fluticasone (FLONASE) 50 MCG/ACT nasal spray Place 2 sprays into both nostrils daily. (Patient not taking: Reported on 06/03/2015) 16 g 6  . HYDROcodone-acetaminophen (NORCO) 10-325 MG tablet Take 0.5 tablets by mouth every 8 (eight) hours as needed. (Patient not taking: Reported on 04/21/2015) 6 tablet 0  . traMADol (ULTRAM) 50 MG tablet      No current facility-administered medications for this encounter.     Physical Findings: The patient is in no acute distress. Patient is alert and oriented.  height is 5' 7.5" (1.715 m) and weight is 176 lb 9.6 oz (80.1 kg). His oral temperature is 97.4 F (36.3 C). His blood pressure is 196/83 (abnormal) and his pulse is 65. His oxygen saturation is  94%.   The heart has a regular rhythm and rate. The lungs are clear to auscultation bilaterally. No palpable supraclavicular, cervical, or axillary adenopathy.  Lab Findings: Lab Results  Component Value Date   WBC 9.5 11/16/2014   HGB 14.8 11/16/2014   HCT 43.8 11/16/2014   MCV 89.0 11/16/2014   PLT 219 11/16/2014    Radiographic Findings: Ct Chest Wo Contrast  Result Date: 01/31/2016 CLINICAL DATA:  Followup lung cancer. Status post XRT and left lower lobe resection. EXAM: CT CHEST WITHOUT CONTRAST TECHNIQUE: Multidetector CT imaging of the chest was performed following the standard protocol without IV contrast. COMPARISON:  08/03/2015 FINDINGS: Cardiovascular: No significant vascular findings. Normal heart size. No pericardial effusion. Aortic atherosclerosis is identified. Calcification within the RCA, LAD and left circumflex coronary artery noted. Mediastinum/Nodes: The trachea appears patent and is midline. Normal appearance of the esophagus. No mediastinal or hilar adenopathy identified. Lungs/Pleura: Moderate to advanced changes of centrilobular emphysema. Postsurgical changes related to left lower lobe wedge resection. Anterior nodular soft tissue thickening within the anterior left lung apex measures 1.4 x 0.9 cm, image 21 of series 2. Unchanged when compared with the previous exam. Right lower lobe lung nodule measures 4 mm, image 103 of series 7. Unchanged from previous exam. No new or enlarging pulmonary nodules identified. Upper Abdomen: Stone within the left kidney measures 6 mm. Multiple gallstones are identified. No acute abnormality noted. Musculoskeletal: No aggressive lytic or  sclerotic bone lesions identified. Degenerative disc disease is identified throughout the thoracic spine. IMPRESSION: 1. Stable postsurgical changes status post left lower lobe wedge resection. 2. Stable nodular thickening at the anterior left lung apex. 3. No new or progressive disease identified 4. Diffuse  bronchial wall thickening with emphysema, as above; imaging findings suggestive of underlying COPD. 5. Aortic atherosclerosis and coronary artery disease. 6. Gallstones and kidney stones. Electronically Signed   By: Kerby Moors M.D.   On: 01/31/2016 14:51    Impression: The patient is doing well this time without any appreciable side effects from his treatment. No signs of recurrence on clinical exam today. Chest CT scan earlier this month is very  encouraging.  Plan:  Routine follow up in 6 months. He will have a CT scan prior to his follow up appointment. Advised patient to follow up with Dr. Shelia Media if his blood pressure continues to be elevated. ____________________________________  Blair Promise, PhD, MD  This document serves as a record of services personally performed by Gery Pray, MD. It was created on his behalf by Bethann Humble, a trained medical scribe. The creation of this record is based on the scribe's personal observations and the provider's statements to them. This document has been checked and approved by the attending provider.

## 2016-02-13 NOTE — Progress Notes (Addendum)
Benjamin Patel is here for follow up after treatment to his left upper lobe.  He denies having any pain.  He continues to have shortness of breath.  He denies having a cough.  He reports having a poor energy level.  His bp on arrival was 203/82 and was 196/83 when retaken.  BP (!) 196/83 (BP Location: Right Arm)   Pulse 65   Temp 97.4 F (36.3 C) (Oral)   Ht 5' 7.5" (1.715 m)   Wt 176 lb 9.6 oz (80.1 kg)   SpO2 94%   BMI 27.25 kg/m   Wt Readings from Last 3 Encounters:  02/13/16 176 lb 9.6 oz (80.1 kg)  08/04/15 177 lb 11.2 oz (80.6 kg)  06/03/15 172 lb (78 kg)

## 2016-02-14 ENCOUNTER — Ambulatory Visit (INDEPENDENT_AMBULATORY_CARE_PROVIDER_SITE_OTHER): Payer: Medicare Other | Admitting: Diagnostic Neuroimaging

## 2016-02-14 ENCOUNTER — Encounter: Payer: Self-pay | Admitting: Diagnostic Neuroimaging

## 2016-02-14 VITALS — BP 180/75 | HR 78 | Ht 69.0 in | Wt 177.4 lb

## 2016-02-14 DIAGNOSIS — M79604 Pain in right leg: Secondary | ICD-10-CM

## 2016-02-14 DIAGNOSIS — I10 Essential (primary) hypertension: Secondary | ICD-10-CM | POA: Diagnosis not present

## 2016-02-14 DIAGNOSIS — I160 Hypertensive urgency: Secondary | ICD-10-CM

## 2016-02-14 MED ORDER — GABAPENTIN 400 MG PO CAPS: 800.0000 mg | ORAL_CAPSULE | Freq: Two times a day (BID) | ORAL | 12 refills | Status: DC

## 2016-02-14 NOTE — Patient Instructions (Signed)
Thank you for coming to see [place] at [facility]. I hope we have been able to provide you high quality care today.  You may receive a patient satisfaction survey over the next few weeks. We would appreciate your feedback and comments so that we may continue to improve ourselves and the health of our patients.  - increase gabapentin to 849m twice a day   ~~~~~~~~~~~~~~~~~~~~~~~~~~~~~~~~~~~~~~~~~~~~~~~~~~~~~~~~~~~~~~~~~  DR. PENUMALLI'S GUIDE TO HAPPY AND HEALTHY LIVING These are some of my general health and wellness recommendations. Some of them may apply to you better than others. Please use common sense as you try these suggestions and feel free to ask me any questions.   ACTIVITY/FITNESS Mental, social, emotional and physical stimulation are very important for brain and body health. Try learning a new activity (arts, music, language, sports, games).  Keep moving your body to the best of your abilities. You can do this at home, inside or outside, the park, community center, gym or anywhere you like. Consider a physical therapist or personal trainer to get started. Consider the app Sworkit. Fitness trackers such as smart-watches, smart-phones or Fitbits can help as well.   NUTRITION Eat more plants: colorful vegetables, nuts, seeds and berries.  Eat less sugar, salt, preservatives and processed foods.  Avoid toxins such as cigarettes and alcohol.  Drink water when you are thirsty. Warm water with a slice of lemon is an excellent morning drink to start the day.  Consider these websites for more information The Nutrition Source (hhttps://www.henry-hernandez.biz/ [employer] Nutrition (wWindowBlog.ch   RELAXATION Consider practicing mindfulness meditation or other relaxation techniques such as deep breathing, prayer, yoga, tai chi, massage. See website mindful.org or the apps Headspace or Calm to help get  started.   SLEEP Try to get at least 7-8+ hours sleep per day. Regular exercise and reduced caffeine will help you sleep better. Practice good sleep hygeine techniques. See website sleep.org for more information.   PLANNING Prepare estate planning, living will, healthcare POA documents. Sometimes this is best planned with the help of an attorney. Theconversationproject.org and agingwithdignity.org are excellent resources.

## 2016-02-14 NOTE — Progress Notes (Signed)
[facility]  PATIENT: Benjamin Patel DOB: [birth year]  REFERRING CLINICIAN: Audie Pinto HISTORY FROM: patient and wife  REASON FOR VISIT: new consult    HISTORICAL  CHIEF COMPLAINT:  Chief Complaint  Patient presents with  . Diabetic neuropathy    rm 7, New Pt, wife- [name], "constant pain in right leg x 1 month"  . Pain    HISTORY OF PRESENT ILLNESS:   83 year old right-handed male here for evaluation of right leg pain. History of mild diabetes, diet controlled. History of hypertension, hyperglycemia, lung cancer status post resection.  Approximately one month ago (Dec 2017) patient had severe sudden onset of right leg pain radiating from his right lateral thigh down to his right knee and right lateral lower leg. His right ankle and right foot were unaffected. No low back pain. No problems with his left leg. No problems with his arms or neck. He denies any rash in the location of his right leg pain. The right leg was severely tender to touch. No swelling in the leg. He was having difficulty walking due to severe pain. Initially pain was 10 out of 10 or "12 out of 10" at times. Spontaneously the pain started to improve. No prodromal injuries, infections, other triggering factors. Now pain is down to 3 out of 10 in severity.  Patient has history of shingles rash and infection approximately 8 years ago when he developed left lower back pain radiating to his left leg. He had red painful rash at that time. He was treated for shingles and symptoms resolved. He did not have shingles vaccine prior to that shingles attack. He has not had shingles vaccine since that attack.  Patient currently on gabapentin for "neuropathy pain" affecting his left foot. He also has history of gout affecting his left foot.   REVIEW OF SYSTEMS: Full 14 system review of systems performed and negative with exception of: Only as per history of present illness.  ALLERGIES: Allergies  Allergen  Reactions  . Ace Inhibitors Cough  . Codeine Other (See Comments)    GI Upset  . Indomethacin Other (See Comments)    Elevated BP  . Losartan Swelling    ANGIOEDEMA/     HOME MEDICATIONS: Outpatient Medications Prior to Visit  Medication Sig Dispense Refill  . cloNIDine (CATAPRES) 0.2 MG tablet TAKE ONE TABLET BY MOUTH TWICE DAILY 180 tablet 0  . FLAXSEED, LINSEED, PO Take by mouth.    . gabapentin (NEURONTIN) 100 MG capsule TAKE TWO CAPSULES BY MOUTH UP TO THREE TIMES A DAY 180 capsule 11  . metoprolol succinate (TOPROL-XL) 100 MG 24 hr tablet Take 1 tablet (100 mg total) by mouth daily. Take with or immediately following a meal. 30 tablet 11  . rosuvastatin (CRESTOR) 10 MG tablet     . traMADol (ULTRAM) 50 MG tablet     . fluticasone (FLONASE) 50 MCG/ACT nasal spray Place 2 sprays into both nostrils daily. (Patient not taking: Reported on 06/03/2015) 16 g 6  . HYDROcodone-acetaminophen (NORCO) 10-325 MG tablet Take 0.5 tablets by mouth every 8 (eight) hours as needed. (Patient not taking: Reported on 04/21/2015) 6 tablet 0   No facility-administered medications prior to visit.     PAST MEDICAL HISTORY: Past Medical History:  Diagnosis Date  . Abnormal prostate exam   . Arthritis    Gout   . Asthma    as a child  . Carotid stenosis   . Carotid stenosis right side   Dr.Early does  carotid ultrasounds yrly-last one Mar 14-report in epic  . Cholelithiasis   . COPD (chronic obstructive pulmonary disease) (HCC)    slight   . Enlarged prostate    slightly   . Essential hypertension, benign    takes Clonodine,Losartan,and Metoprolol and HCTZ  . Gout    no meds required  . History of bronchitis   . History of colon polyps   . History of shingles   . Lung cancer ([place]) dx'd 2014   LLL resection; xrt  . Mixed hyperlipidemia    takes Pravastatin daily  . PAC (premature atrial contraction)   . Pneumonia 02/08/2012  . PVCs (premature ventricular contractions)   . Radiation  12/13/14-12/24/14   left upper lobe nodule 50 gray  . Renal artery stenosis (HCC)    right  . Renal insufficiency   . Skin cancer    Lung Ca- Left Lung  . Type II or unspecified type diabetes mellitus without mention of complication, not stated as uncontrolled    02/14/2016 diet controlled    PAST SURGICAL HISTORY: Past Surgical History:  Procedure Laterality Date  . COLONOSCOPY    . NO PAST SURGERIES    . SEGMENTECOMY Left 06/16/2012   Procedure: left lower lobe superior SEGMENTECTOMY with node dissection;  Surgeon: [name], MD;  Location: [facility];  Service: Thoracic;  Laterality: Left;  left superior  . VIDEO ASSISTED THORACOSCOPY Left 06/16/2012   Procedure: VIDEO ASSISTED THORACOSCOPY;  Surgeon: [name], MD;  Location: [facility];  Service: Thoracic;  Laterality: Left;    FAMILY HISTORY: Family History  Problem Relation Age of Onset  . Heart disease Mother   . Diabetes Mother     AKA  Right   . Hypertension Mother   . Varicose Veins Mother   . Kidney disease Mother     One Kidney  . Cancer Father     Lung    SOCIAL HISTORY:  Social History   Social History  . Marital status: Married    Spouse name: [name]  . Number of children: 3  . Years of education: 12   Occupational History  . retired     Herbalist   Social History Main Topics  . Smoking status: Former Smoker    Packs/day: 1.50    Years: 30.00    Types: Cigarettes    Quit date: 01/16/1976  . Smokeless tobacco: Never Used  . Alcohol use No  . Drug use: No  . Sexual activity: Not on file   Other Topics Concern  . Not on file   Social History Narrative   Lives at home with wife   Caffeine- coffee, 1 cup daily     PHYSICAL EXAM  GENERAL EXAM/CONSTITUTIONAL: Vitals:  Vitals:   02/14/16 1411  BP: (!) 180/75  Pulse: 78  Weight: 177 lb 6.4 oz (80.5 kg)  Height: '5\' 9"'$  (1.753 m)     Body mass index is 26.2 kg/m.  Visual Acuity Screening   Right eye Left eye Both  eyes  Without correction: 20/40 20/40   With correction:        Patient is in no distress; well developed, nourished and groomed; neck is supple  CARDIOVASCULAR:  Examination of carotid arteries is normal; BILATERAL CAROTID BRUITS  Regular rate and rhythm, no murmurs  Examination of peripheral vascular system by observation and palpation is normal  EYES:  Ophthalmoscopic exam of optic discs and posterior segments is normal; no papilledema or hemorrhages  MUSCULOSKELETAL:  Gait, strength, tone, movements noted in Neurologic exam below  NEUROLOGIC: MENTAL STATUS:  No flowsheet data found.  awake, alert, oriented to person, place and time  recent and remote memory intact  normal attention and concentration  language fluent, comprehension intact, naming intact,   fund of knowledge appropriate  CRANIAL NERVE:   2nd - no papilledema on fundoscopic exam  2nd, 3rd, 4th, 6th - pupils equal and reactive to light, visual fields full to confrontation, extraocular muscles intact, no nystagmus  5th - facial sensation symmetric  7th - facial strength symmetric  8th - hearing intact  9th - palate elevates symmetrically, uvula midline  11th - shoulder shrug symmetric  12th - tongue protrusion midline  MOTOR:   normal bulk and tone, full strength in the BUE, BLE, EXCEPT RIGHT FOOT DORSIFLEXION 4+  SENSORY:   normal and symmetric to light touch, temperature, vibration, EXCEPT:  ABSENT VIBRATION AT TOES  COORDINATION:   finger-nose-finger, fine finger movements normal  REFLEXES:   deep tendon reflexes present and symmetric  GAIT/STATION:   narrow based gait; ANTALGIC GAIT; SLOW AND CAUTIOUS; DECR HEEL WALKING WITH RIGHT FOOT; ABLE TO WALK ON TOES     DIAGNOSTIC DATA (LABS, IMAGING, TESTING) - I reviewed patient records, labs, notes, testing and imaging myself where available.  Lab Results  Component Value Date   WBC 9.5 11/16/2014   HGB 14.8  11/16/2014   HCT 43.8 11/16/2014   MCV 89.0 11/16/2014   PLT 219 11/16/2014      Component Value Date/Time   NA 133 (L) 11/16/2014 2053   K 4.1 11/16/2014 2053   CL 94 (L) 11/16/2014 2053   CO2 28 11/16/2014 2053   GLUCOSE 155 (H) 11/16/2014 2053   BUN 21 (H) 11/16/2014 2053   CREATININE 1.15 11/16/2014 2053   CREATININE 1.26 (H) 08/17/2014 0928   CALCIUM 9.8 11/16/2014 2053   PROT 7.3 11/16/2014 2053   ALBUMIN 4.1 11/16/2014 2053   AST 22 11/16/2014 2053   ALT 15 (L) 11/16/2014 2053   ALKPHOS 83 11/16/2014 2053   BILITOT 0.8 11/16/2014 2053   GFRNONAA 56 (L) 11/16/2014 2053   GFRNONAA 52 (L) 08/17/2014 0928   GFRAA >60 11/16/2014 2053   GFRAA 60 08/17/2014 0928   Lab Results  Component Value Date   CHOL 186 08/17/2014   HDL 40 08/17/2014   LDLCALC 99 08/17/2014   LDLDIRECT 100.5 12/22/2012   TRIG 234 (H) 08/17/2014   CHOLHDL 4.7 08/17/2014   Lab Results  Component Value Date   HGBA1C 6.7 04/21/2015   Lab Results  Component Value Date   VITAMINB12 672 04/09/2013   Lab Results  Component Value Date   TSH 0.288 (L) 06/21/2012        ASSESSMENT AND PLAN  83 y.o. year old male here with new onset severe pain in his right lateral leg, approximately one month ago, with spontaneous improvement. Signs and symptoms suspicious for lumbar radiculopathy (L5). I offered to check MRI lumbar spine the patient elected to hold off since symptoms are significantly improved.   Ddx: lumbar radiculopathy, plexopathy, diabetes, post-viral (zoster sine herpete or shingles without rash)  1. Right leg pain   2. Accelerated hypertension   3. Asymptomatic hypertensive urgency      PLAN: - monitor symptoms; if worsening or failing to resolve, then may consider MRI lumbar spine - increase gabapentin for pain control - follow up with PCP re: HTN (Blood pressure (!) 180/75, pulse 78)  Meds ordered this  encounter  Medications  . DISCONTD: gabapentin (NEURONTIN) 400 MG capsule     Sig: Take 2 capsules (800 mg total) by mouth 2 (two) times daily. TAKE TWO CAPSULES BY MOUTH UP TO THREE TIMES A DAY    Dispense:  60 capsule    Refill:  12  . gabapentin (NEURONTIN) 400 MG capsule    Sig: Take 2 capsules (800 mg total) by mouth 2 (two) times daily.    Dispense:  60 capsule    Refill:  12   Return in about 3 months (around 05/14/2016).    Penni Bombard, MD 5/62/1308, 6:57 PM Certified in Neurology, Neurophysiology and Neuroimaging  [facility] [address] [place], [place] [address] [phone]

## 2016-02-15 ENCOUNTER — Telehealth: Payer: Self-pay | Admitting: *Deleted

## 2016-02-15 NOTE — Telephone Encounter (Signed)
CALLED PATIENT TO INFORM OF CT AND FU VISIT FOR July 2018, SPOKE WITH PATIENT AND HE IS AWARE OF THESE APPTS.

## 2016-03-08 DIAGNOSIS — M10032 Idiopathic gout, left wrist: Secondary | ICD-10-CM | POA: Diagnosis not present

## 2016-03-08 DIAGNOSIS — E1140 Type 2 diabetes mellitus with diabetic neuropathy, unspecified: Secondary | ICD-10-CM | POA: Diagnosis not present

## 2016-03-14 DIAGNOSIS — M65839 Other synovitis and tenosynovitis, unspecified forearm: Secondary | ICD-10-CM | POA: Diagnosis not present

## 2016-03-14 DIAGNOSIS — M11241 Other chondrocalcinosis, right hand: Secondary | ICD-10-CM | POA: Diagnosis not present

## 2016-03-14 DIAGNOSIS — M10042 Idiopathic gout, left hand: Secondary | ICD-10-CM | POA: Diagnosis not present

## 2016-03-14 DIAGNOSIS — M109 Gout, unspecified: Secondary | ICD-10-CM | POA: Diagnosis not present

## 2016-03-14 DIAGNOSIS — M25532 Pain in left wrist: Secondary | ICD-10-CM | POA: Diagnosis not present

## 2016-03-21 DIAGNOSIS — M1180 Other specified crystal arthropathies, unspecified site: Secondary | ICD-10-CM | POA: Diagnosis not present

## 2016-03-21 DIAGNOSIS — M25532 Pain in left wrist: Secondary | ICD-10-CM | POA: Diagnosis not present

## 2016-03-21 DIAGNOSIS — M65839 Other synovitis and tenosynovitis, unspecified forearm: Secondary | ICD-10-CM | POA: Diagnosis not present

## 2016-03-21 DIAGNOSIS — M109 Gout, unspecified: Secondary | ICD-10-CM | POA: Diagnosis not present

## 2016-04-18 DIAGNOSIS — E790 Hyperuricemia without signs of inflammatory arthritis and tophaceous disease: Secondary | ICD-10-CM | POA: Diagnosis not present

## 2016-04-18 DIAGNOSIS — M1180 Other specified crystal arthropathies, unspecified site: Secondary | ICD-10-CM | POA: Diagnosis not present

## 2016-04-18 DIAGNOSIS — M109 Gout, unspecified: Secondary | ICD-10-CM | POA: Diagnosis not present

## 2016-04-18 DIAGNOSIS — M25532 Pain in left wrist: Secondary | ICD-10-CM | POA: Diagnosis not present

## 2016-05-04 DIAGNOSIS — E7800 Pure hypercholesterolemia, unspecified: Secondary | ICD-10-CM | POA: Diagnosis not present

## 2016-05-04 DIAGNOSIS — Z0000 Encounter for general adult medical examination without abnormal findings: Secondary | ICD-10-CM | POA: Diagnosis not present

## 2016-05-04 DIAGNOSIS — M1020 Drug-induced gout, unspecified site: Secondary | ICD-10-CM | POA: Diagnosis not present

## 2016-05-04 DIAGNOSIS — I10 Essential (primary) hypertension: Secondary | ICD-10-CM | POA: Diagnosis not present

## 2016-05-04 DIAGNOSIS — E1140 Type 2 diabetes mellitus with diabetic neuropathy, unspecified: Secondary | ICD-10-CM | POA: Diagnosis not present

## 2016-05-04 DIAGNOSIS — Z23 Encounter for immunization: Secondary | ICD-10-CM | POA: Diagnosis not present

## 2016-05-08 DIAGNOSIS — M1020 Drug-induced gout, unspecified site: Secondary | ICD-10-CM | POA: Diagnosis not present

## 2016-05-08 DIAGNOSIS — I10 Essential (primary) hypertension: Secondary | ICD-10-CM | POA: Diagnosis not present

## 2016-05-08 DIAGNOSIS — E7800 Pure hypercholesterolemia, unspecified: Secondary | ICD-10-CM | POA: Diagnosis not present

## 2016-05-08 DIAGNOSIS — I6523 Occlusion and stenosis of bilateral carotid arteries: Secondary | ICD-10-CM | POA: Diagnosis not present

## 2016-05-16 DIAGNOSIS — E790 Hyperuricemia without signs of inflammatory arthritis and tophaceous disease: Secondary | ICD-10-CM | POA: Diagnosis not present

## 2016-05-16 DIAGNOSIS — M109 Gout, unspecified: Secondary | ICD-10-CM | POA: Diagnosis not present

## 2016-05-18 ENCOUNTER — Encounter: Payer: Self-pay | Admitting: Diagnostic Neuroimaging

## 2016-05-18 ENCOUNTER — Ambulatory Visit (INDEPENDENT_AMBULATORY_CARE_PROVIDER_SITE_OTHER): Payer: Medicare Other | Admitting: Diagnostic Neuroimaging

## 2016-05-18 VITALS — BP 134/69 | HR 63 | Wt 173.0 lb

## 2016-05-18 DIAGNOSIS — M79604 Pain in right leg: Secondary | ICD-10-CM | POA: Diagnosis not present

## 2016-05-18 NOTE — Progress Notes (Signed)
[facility]  PATIENT: Benjamin Patel DOB: [birth year]  REFERRING CLINICIAN: Audie Pinto HISTORY FROM: patient REASON FOR VISIT: follow up   HISTORICAL  CHIEF COMPLAINT:  Chief Complaint  Patient presents with  . Right leg pain    rm 7, "no change in pain"  . Follow-up    3 months    HISTORY OF PRESENT ILLNESS:   UPDATE 05/18/16: Since last visit, right leg pain is resolved. On Gabapentin '400mg'$  BID. Left toe pain continues.   PRIOR HPI (02/14/16): 83 year old right-handed male here for evaluation of right leg pain. History of mild diabetes, diet controlled. History of hypertension, hyperglycemia, lung cancer status post resection. Approximately one month ago (Dec 2017) patient had severe sudden onset of right leg pain radiating from his right lateral thigh down to his right knee and right lateral lower leg. His right ankle and right foot were unaffected. No low back pain. No problems with his left leg. No problems with his arms or neck. He denies any rash in the location of his right leg pain. The right leg was severely tender to touch. No swelling in the leg. He was having difficulty walking due to severe pain. Initially pain was 10 out of 10 or "12 out of 10" at times. Spontaneously the pain started to improve. No prodromal injuries, infections, other triggering factors. Now pain is down to 3 out of 10 in severity. Patient has history of shingles rash and infection approximately 8 years ago when he developed left lower back pain radiating to his left leg. He had red painful rash at that time. He was treated for shingles and symptoms resolved. He did not have shingles vaccine prior to that shingles attack. He has not had shingles vaccine since that attack. Patient currently on gabapentin for "neuropathy pain" affecting his left foot. He also has history of gout affecting his left foot.   REVIEW OF SYSTEMS: Full 14 system review of systems performed and negative with  exception of: Only as per history of present illness.  ALLERGIES: Allergies  Allergen Reactions  . Ace Inhibitors Cough  . Codeine Other (See Comments)    GI Upset  . Indomethacin Other (See Comments)    Elevated BP  . Losartan Swelling    ANGIOEDEMA/     HOME MEDICATIONS: Outpatient Medications Prior to Visit  Medication Sig Dispense Refill  . cloNIDine (CATAPRES) 0.2 MG tablet TAKE ONE TABLET BY MOUTH TWICE DAILY 180 tablet 0  . FLAXSEED, LINSEED, PO Take by mouth.    . gabapentin (NEURONTIN) 400 MG capsule Take 2 capsules (800 mg total) by mouth 2 (two) times daily. 60 capsule 12  . metoprolol succinate (TOPROL-XL) 100 MG 24 hr tablet Take 1 tablet (100 mg total) by mouth daily. Take with or immediately following a meal. 30 tablet 11  . rosuvastatin (CRESTOR) 10 MG tablet     . traMADol (ULTRAM) 50 MG tablet      No facility-administered medications prior to visit.     PAST MEDICAL HISTORY: Past Medical History:  Diagnosis Date  . Abnormal prostate exam   . Arthritis    Gout   . Asthma    as a child  . Carotid stenosis   . Carotid stenosis right side   Dr.Early does carotid ultrasounds yrly-last one Mar 14-report in epic  . Cholelithiasis   . COPD (chronic obstructive pulmonary disease) (HCC)    slight   . Enlarged prostate    slightly   .  Essential hypertension, benign    takes Clonodine,Losartan,and Metoprolol and HCTZ  . Gout    no meds required  . History of bronchitis   . History of colon polyps   . History of shingles   . Lung cancer ([place]) dx'd 2014   LLL resection; xrt  . Mixed hyperlipidemia    takes Pravastatin daily  . PAC (premature atrial contraction)   . Pneumonia 02/08/2012  . PVCs (premature ventricular contractions)   . Radiation 12/13/14-12/24/14   left upper lobe nodule 50 gray  . Renal artery stenosis (HCC)    right  . Renal insufficiency   . Skin cancer    Lung Ca- Left Lung  . Type II or unspecified type diabetes mellitus without  mention of complication, not stated as uncontrolled    02/14/2016 diet controlled    PAST SURGICAL HISTORY: Past Surgical History:  Procedure Laterality Date  . COLONOSCOPY    . NO PAST SURGERIES    . SEGMENTECOMY Left 06/16/2012   Procedure: left lower lobe superior SEGMENTECTOMY with node dissection;  Surgeon: [name], MD;  Location: [facility];  Service: Thoracic;  Laterality: Left;  left superior  . VIDEO ASSISTED THORACOSCOPY Left 06/16/2012   Procedure: VIDEO ASSISTED THORACOSCOPY;  Surgeon: [name], MD;  Location: [facility];  Service: Thoracic;  Laterality: Left;    FAMILY HISTORY: Family History  Problem Relation Age of Onset  . Heart disease Mother   . Diabetes Mother     AKA  Right   . Hypertension Mother   . Varicose Veins Mother   . Kidney disease Mother     One Kidney  . Cancer Father     Lung    SOCIAL HISTORY:  Social History   Social History  . Marital status: Married    Spouse name: [name]  . Number of children: 3  . Years of education: 12   Occupational History  . retired     Herbalist   Social History Main Topics  . Smoking status: Former Smoker    Packs/day: 1.50    Years: 30.00    Types: Cigarettes    Quit date: 01/16/1976  . Smokeless tobacco: Never Used  . Alcohol use No  . Drug use: No  . Sexual activity: Not on file   Other Topics Concern  . Not on file   Social History Narrative   Lives at home with wife   Caffeine- coffee, 1 cup daily     PHYSICAL EXAM  GENERAL EXAM/CONSTITUTIONAL: Vitals:  Vitals:   05/18/16 1124  BP: 134/69  Pulse: 63  Weight: 173 lb (78.5 kg)   Body mass index is 25.55 kg/m. No exam data present  Patient is in no distress; well developed, nourished and groomed; neck is supple  CARDIOVASCULAR:  Examination of carotid arteries is normal; BILATERAL CAROTID BRUITS  Regular rate and rhythm, no murmurs  Examination of peripheral vascular system by observation and palpation is  normal  EYES:  Ophthalmoscopic exam of optic discs and posterior segments is normal; no papilledema or hemorrhages  MUSCULOSKELETAL:  Gait, strength, tone, movements noted in Neurologic exam below  NEUROLOGIC: MENTAL STATUS:  No flowsheet data found.  awake, alert, oriented to person, place and time  recent and remote memory intact  normal attention and concentration  language fluent, comprehension intact, naming intact,   fund of knowledge appropriate  CRANIAL NERVE:   2nd - no papilledema on fundoscopic exam  2nd, 3rd, 4th, 6th -  pupils equal and reactive to light, visual fields full to confrontation, extraocular muscles intact, no nystagmus  5th - facial sensation symmetric  7th - facial strength symmetric  8th - hearing intact  9th - palate elevates symmetrically, uvula midline  11th - shoulder shrug symmetric  12th - tongue protrusion midline  MOTOR:   normal bulk and tone, full strength in the BUE, BLE  SENSORY:   normal and symmetric to light touch, temperature, vibration  COORDINATION:   finger-nose-finger, fine finger movements normal  REFLEXES:   deep tendon reflexes present and symmetric  GAIT/STATION:   narrow based gait; stable gait     DIAGNOSTIC DATA (LABS, IMAGING, TESTING) - I reviewed patient records, labs, notes, testing and imaging myself where available.  Lab Results  Component Value Date   WBC 9.5 11/16/2014   HGB 14.8 11/16/2014   HCT 43.8 11/16/2014   MCV 89.0 11/16/2014   PLT 219 11/16/2014      Component Value Date/Time   NA 133 (L) 11/16/2014 2053   K 4.1 11/16/2014 2053   CL 94 (L) 11/16/2014 2053   CO2 28 11/16/2014 2053   GLUCOSE 155 (H) 11/16/2014 2053   BUN 21 (H) 11/16/2014 2053   CREATININE 1.15 11/16/2014 2053   CREATININE 1.26 (H) 08/17/2014 0928   CALCIUM 9.8 11/16/2014 2053   PROT 7.3 11/16/2014 2053   ALBUMIN 4.1 11/16/2014 2053   AST 22 11/16/2014 2053   ALT 15 (L) 11/16/2014 2053    ALKPHOS 83 11/16/2014 2053   BILITOT 0.8 11/16/2014 2053   GFRNONAA 56 (L) 11/16/2014 2053   GFRNONAA 52 (L) 08/17/2014 0928   GFRAA >60 11/16/2014 2053   GFRAA 60 08/17/2014 0928   Lab Results  Component Value Date   CHOL 186 08/17/2014   HDL 40 08/17/2014   LDLCALC 99 08/17/2014   LDLDIRECT 100.5 12/22/2012   TRIG 234 (H) 08/17/2014   CHOLHDL 4.7 08/17/2014   Lab Results  Component Value Date   HGBA1C 6.7 04/21/2015   Lab Results  Component Value Date   VITAMINB12 672 04/09/2013   Lab Results  Component Value Date   TSH 0.288 (L) 06/21/2012        ASSESSMENT AND PLAN  83 y.o. year old male here with new onset severe pain in his right lateral leg, approximately in Dec 2017, now significantly improved. Signs and symptoms were suspicious for lumbar radiculopathy (L5).    Dx: lumbar radiculopathy (degenerative, diabetic or post-viral)  1. Right leg pain      PLAN: I spent 15 minutes of face to face time with patient. Greater than 50% of time was spent in counseling and coordination of care with patient. In summary we discussed:  - monitor symptoms; if worsening in future then may consider MRI lumbar spine - continue gabapentin for pain control  Return if symptoms worsen or fail to improve, for return to PCP.    Penni Bombard, MD 02/21/9483, 46:27 AM Certified in Neurology, Neurophysiology and Neuroimaging  [facility] [address], [place], [place] [address] [phone]

## 2016-05-29 DIAGNOSIS — I10 Essential (primary) hypertension: Secondary | ICD-10-CM | POA: Diagnosis not present

## 2016-06-01 ENCOUNTER — Encounter: Payer: Self-pay | Admitting: Family

## 2016-06-12 ENCOUNTER — Ambulatory Visit (HOSPITAL_COMMUNITY)
Admission: RE | Admit: 2016-06-12 | Discharge: 2016-06-12 | Disposition: A | Discharge status: Home / Self Care | Payer: Medicare Other | Source: Ambulatory Visit | Attending: Family | Admitting: Family

## 2016-06-12 ENCOUNTER — Ambulatory Visit (INDEPENDENT_AMBULATORY_CARE_PROVIDER_SITE_OTHER): Payer: Medicare Other | Admitting: Family

## 2016-06-12 ENCOUNTER — Encounter: Payer: Self-pay | Admitting: Family

## 2016-06-12 VITALS — BP 124/65 | HR 55 | Resp 16 | Ht 69.0 in | Wt 174.4 lb

## 2016-06-12 DIAGNOSIS — I6523 Occlusion and stenosis of bilateral carotid arteries: Secondary | ICD-10-CM | POA: Diagnosis not present

## 2016-06-12 DIAGNOSIS — R0989 Other specified symptoms and signs involving the circulatory and respiratory systems: Secondary | ICD-10-CM | POA: Diagnosis not present

## 2016-06-12 DIAGNOSIS — E1151 Type 2 diabetes mellitus with diabetic peripheral angiopathy without gangrene: Secondary | ICD-10-CM

## 2016-06-12 LAB — VAS US CAROTID
LCCADDIAS: 13 cm/s
LCCADSYS: 104 cm/s
LCCAPDIAS: 12 cm/s
LCCAPSYS: 99 cm/s
LEFT ECA DIAS: -6 cm/s
LEFT VERTEBRAL DIAS: -14 cm/s
LICADSYS: -124 cm/s
Left ICA dist dias: -30 cm/s
Left ICA prox dias: 19 cm/s
Left ICA prox sys: 292 cm/s
RCCADSYS: -101 cm/s
RCCAPDIAS: 7 cm/s
RCCAPSYS: 69 cm/s
RIGHT CCA MID DIAS: 14 cm/s
RIGHT ECA DIAS: -7 cm/s

## 2016-06-12 NOTE — Patient Instructions (Signed)
Stroke Prevention Some medical conditions and behaviors are associated with an increased chance of having a stroke. You may prevent a stroke by making healthy choices and managing medical conditions. How can I reduce my risk of having a stroke?  Stay physically active. Get at least 30 minutes of activity on most or all days.  Do not smoke. It may also be helpful to avoid exposure to secondhand smoke.  Limit alcohol use. Moderate alcohol use is considered to be:  No more than 2 drinks per day for men.  No more than 1 drink per day for nonpregnant women.  Eat healthy foods. This involves:  Eating 5 or more servings of fruits and vegetables a day.  Making dietary changes that address high blood pressure (hypertension), high cholesterol, diabetes, or obesity.  Manage your cholesterol levels.  Making food choices that are high in fiber and low in saturated fat, trans fat, and cholesterol may control cholesterol levels.  Take any prescribed medicines to control cholesterol as directed by your health care provider.  Manage your diabetes.  Controlling your carbohydrate and sugar intake is recommended to manage diabetes.  Take any prescribed medicines to control diabetes as directed by your health care provider.  Control your hypertension.  Making food choices that are low in salt (sodium), saturated fat, trans fat, and cholesterol is recommended to manage hypertension.  Ask your health care provider if you need treatment to lower your blood pressure. Take any prescribed medicines to control hypertension as directed by your health care provider.  If you are 18-39 years of age, have your blood pressure checked every 3-5 years. If you are 40 years of age or older, have your blood pressure checked every year.  Maintain a healthy weight.  Reducing calorie intake and making food choices that are low in sodium, saturated fat, trans fat, and cholesterol are recommended to manage  weight.  Stop drug abuse.  Avoid taking birth control pills.  Talk to your health care provider about the risks of taking birth control pills if you are over 35 years old, smoke, get migraines, or have ever had a blood clot.  Get evaluated for sleep disorders (sleep apnea).  Talk to your health care provider about getting a sleep evaluation if you snore a lot or have excessive sleepiness.  Take medicines only as directed by your health care provider.  For some people, aspirin or blood thinners (anticoagulants) are helpful in reducing the risk of forming abnormal blood clots that can lead to stroke. If you have the irregular heart rhythm of atrial fibrillation, you should be on a blood thinner unless there is a good reason you cannot take them.  Understand all your medicine instructions.  Make sure that other conditions (such as anemia or atherosclerosis) are addressed. Get help right away if:  You have sudden weakness or numbness of the face, arm, or leg, especially on one side of the body.  Your face or eyelid droops to one side.  You have sudden confusion.  You have trouble speaking (aphasia) or understanding.  You have sudden trouble seeing in one or both eyes.  You have sudden trouble walking.  You have dizziness.  You have a loss of balance or coordination.  You have a sudden, severe headache with no known cause.  You have new chest pain or an irregular heartbeat. Any of these symptoms may represent a serious problem that is an emergency. Do not wait to see if the symptoms will go away.   Get medical help at once. Call your local emergency services (911 in [place] Do not drive yourself to the hospital. This information is not intended to replace advice given to you by your health care provider. Make sure you discuss any questions you have with your health care provider. Document Released: 02/09/2004 Document Revised: 06/09/2015 Document Reviewed: 07/04/2012 Elsevier  Interactive Patient Education  2017 Elsevier Inc.      Preventing Cerebrovascular Disease Arteries are blood vessels that carry blood that contains oxygen from the heart to all parts of the body. Cerebrovascular disease affects arteries that supply the brain. Any condition that blocks or disrupts blood flow to the brain can cause cerebrovascular disease. Brain cells that lose blood supply start to die within minutes (stroke). Stroke is the main danger of cerebrovascular disease. Atherosclerosis and high blood pressure are common causes of cerebrovascular disease. Atherosclerosis is narrowing and hardening of an artery that results when fat, cholesterol, calcium, or other substances (plaque) build up inside an artery. Plaque reduces blood flow through the artery. High blood pressure increases the risk of bleeding inside the brain. Making diet and lifestyle changes to prevent atherosclerosis and high blood pressure lowers your risk of cerebrovascular disease. What nutrition changes can be made?  Eat more fruits, vegetables, and whole grains.  Reduce how much saturated fat you eat. To do this, eat less red meat and fewer full-fat dairy products.  Eat healthy proteins instead of red meat. Healthy proteins include:  Fish. Eat fish that contains heart-healthy omega-3 fatty acids, twice a week. Examples include salmon, albacore tuna, mackerel, and herring.  Chicken.  Nuts.  Low-fat or nonfat yogurt.  Avoid processed meats, like bacon and lunchmeat.  Avoid foods that contain:  A lot of sugar, such as sweets and drinks with added sugar.  A lot of salt (sodium). Avoid adding extra salt to your food, as told by your health care provider.  Trans fats, such as margarine and baked goods. Trans fats may be listed as "partially hydrogenated oils" on food labels.  Check food labels to see how much sodium, sugar, and trans fats are in foods.  Use vegetable oils that contain low amounts of  saturated fat, such as olive oil or canola oil. What lifestyle changes can be made?  Drink alcohol in moderation. This means no more than 1 drink a day for nonpregnant women and 2 drinks a day for men. One drink equals 12 oz of beer, 5 oz of wine, or 1 oz of hard liquor.  If you are overweight, ask your health care provider to recommend a weight-loss plan for you. Losing 5-10 lb (2.2-4.5 kg) can reduce your risk of diabetes, atherosclerosis, and high blood pressure.  Exercise for 30?60 minutes on most days, or as much as told by your health care provider.  Do moderate-intensity exercise, such as brisk walking, bicycling, and water aerobics. Ask your health care provider which activities are safe for you.  Do not use any products that contain nicotine or tobacco, such as cigarettes and e-cigarettes. If you need help quitting, ask your health care provider. Why are these changes important? Making these changes lowers your risk of many diseases that can cause cerebrovascular disease and stroke. Stroke is a leading cause of death and disability. Making these changes also improves your overall health and quality of life. What can I do to lower my risk? The following factors make you more likely to develop cerebrovascular disease:  Being overweight.  Smoking.  Being physically   inactive.  Eating a high-fat diet.  Having certain health conditions, such as:  Diabetes.  High blood pressure.  Heart disease.  Atherosclerosis.  High cholesterol.  Sickle cell disease. Talk with your health care provider about your risk for cerebrovascular disease. Work with your health care provider to control diseases that you have that may contribute to cerebrovascular disease. Your health care provider may prescribe medicines to help prevent major causes of cerebrovascular disease. Where to find more information: Learn more about preventing cerebrovascular disease from:  National Heart, Lung, and  Blood Institute: www.nhlbi.nih.gov/health/health-topics/topics/stroke  Centers for Disease Control and Prevention: cdc.gov/stroke/about.htm Summary  Cerebrovascular disease can lead to a stroke.  Atherosclerosis and high blood pressure are major causes of cerebrovascular disease.  Making diet and lifestyle changes can reduce your risk of cerebrovascular disease.  Work with your health care provider to get your risk factors under control to reduce your risk of cerebrovascular disease. This information is not intended to replace advice given to you by your health care provider. Make sure you discuss any questions you have with your health care provider. Document Released: 01/16/2015 Document Revised: 07/22/2015 Document Reviewed: 01/16/2015 Elsevier Interactive Patient Education  2017 Elsevier Inc.  

## 2016-06-12 NOTE — Addendum Note (Signed)
Addended by: Lianne Cure A on: 06/12/2016 01:17 PM   Modules accepted: Orders

## 2016-06-12 NOTE — Progress Notes (Signed)
Chief Complaint: Follow up Extracranial Carotid Artery Stenosis   History of Present Illness  Benjamin Patel is a 83 y.o. male patient of Dr. Donnetta Hutching who has known extracranial carotid artery stenosis with bilateral carotid bruits. He returns today for routine surveillance.  Patient has not had previous carotid artery intervention.  The patient denies any history of TIA or stroke symptoms.Specifically he denies a history of amaurosis fugax or monocular blindness, unilateral facial drooping, hemiplegia, or receptive or expressive aphasia.  Pt denies tingling, numbness, pain, or cold sensation in either hand or arm.  He denies non healing wounds, denies claudication symptoms with walking.   His life is signifcantly impacted by intermittent left great toe severe pain for 1-2 years; pt states gabapentin is not helping, topicals are not helping: 4% Lidocaine patch, Aspercreme, etc. This has kept him awake at night.   He had 10 days of radiation tx in December 2016 directed at a small lesion in his lung, questionable whether this was cancer per pt. He has a CT chest follow up in every 6 months. He had a wedge resection left lung for cancer, June, 2014.   Pt would like my progress note sent to Dr. Roxan Hockey, his thoracic surgeon, Dr. Marlou Porch, his cardiologist, in addition to his PCP.   Pt Diabetic: Yes, he states his last A1C was 6.0  Pt smoker: former smoker, quit in 1978  Pt meds include: Statin : Yes ASA: yes Other anticoagulants/antiplatelets: no    Past Medical History:  Diagnosis Date  . Abnormal prostate exam   . Arthritis    Gout   . Asthma    as a child  . Carotid stenosis   . Carotid stenosis right side   Dr.Early does carotid ultrasounds yrly-last one Mar 14-report in epic  . Cholelithiasis   . COPD (chronic obstructive pulmonary disease) (HCC)    slight   . Enlarged prostate    slightly   . Essential hypertension, benign    takes Clonodine,Losartan,and  Metoprolol and HCTZ  . Gout    no meds required  . History of bronchitis   . History of colon polyps   . History of shingles   . Lung cancer ([place]) dx'd 2014   LLL resection; xrt  . Mixed hyperlipidemia    takes Pravastatin daily  . PAC (premature atrial contraction)   . Pneumonia 02/08/2012  . PVCs (premature ventricular contractions)   . Radiation 12/13/14-12/24/14   left upper lobe nodule 50 gray  . Renal artery stenosis (HCC)    right  . Renal insufficiency   . Skin cancer    Lung Ca- Left Lung  . Type II or unspecified type diabetes mellitus without mention of complication, not stated as uncontrolled    02/14/2016 diet controlled    Social History Social History  Substance Use Topics  . Smoking status: Former Smoker    Packs/day: 1.50    Years: 30.00    Types: Cigarettes    Quit date: 01/16/1976  . Smokeless tobacco: Never Used  . Alcohol use No    Family History Family History  Problem Relation Age of Onset  . Heart disease Mother   . Diabetes Mother        AKA  Right   . Hypertension Mother   . Varicose Veins Mother   . Kidney disease Mother        One Kidney  . Cancer Father        Lung  Surgical History Past Surgical History:  Procedure Laterality Date  . COLONOSCOPY    . NO PAST SURGERIES    . SEGMENTECOMY Left 06/16/2012   Procedure: left lower lobe superior SEGMENTECTOMY with node dissection;  Surgeon: [name], MD;  Location: [facility];  Service: Thoracic;  Laterality: Left;  left superior  . VIDEO ASSISTED THORACOSCOPY Left 06/16/2012   Procedure: VIDEO ASSISTED THORACOSCOPY;  Surgeon: [name], MD;  Location: [facility];  Service: Thoracic;  Laterality: Left;    Allergies  Allergen Reactions  . Ace Inhibitors Cough  . Codeine Other (See Comments)    GI Upset  . Indomethacin Other (See Comments)    Elevated BP  . Losartan Swelling    ANGIOEDEMA/     Current Outpatient Prescriptions  Medication Sig Dispense Refill  .  allopurinol (ZYLOPRIM) 100 MG tablet 100 mg daily.    Marland Kitchen amLODipine (NORVASC) 5 MG tablet     . aspirin EC 81 MG tablet Take 81 mg by mouth daily.    . cloNIDine (CATAPRES) 0.2 MG tablet TAKE ONE TABLET BY MOUTH TWICE DAILY 180 tablet 0  . FLAXSEED, LINSEED, PO Take by mouth.    . gabapentin (NEURONTIN) 400 MG capsule Take 2 capsules (800 mg total) by mouth 2 (two) times daily. 60 capsule 12  . metoprolol succinate (TOPROL-XL) 100 MG 24 hr tablet Take 1 tablet (100 mg total) by mouth daily. Take with or immediately following a meal. 30 tablet 11  . rosuvastatin (CRESTOR) 10 MG tablet      No current facility-administered medications for this visit.     Review of Systems : See HPI for pertinent positives and negatives.  Physical Examination  Vitals:   06/12/16 1057  BP: 124/65  Pulse: (!) 55  Resp: 16  SpO2: 94%  Weight: 174 lb 6 oz (79.1 kg)  Height: 5\' 9"  (1.753 m)   Body mass index is 25.75 kg/m.  General: WDWN male in NAD GAIT: normal Eyes: PERRLA Pulmonary: Respirations are non-labored, CTAB Cardiac: regular rhythm, no detected murmur.  VASCULAR EXAM Carotid Bruits Left Right   Positive(harsh) Positive(soft)   Aorta is not palpable. Radial pulses are 2+ palpable and equal.      LE Pulses LEFT RIGHT   POPLITEAL not palpable  not palpable   POSTERIOR TIBIAL  2+ palpable   2+ palpable    DORSALIS PEDIS  ANTERIOR TIBIAL 2+ palpable  2+ palpable     Gastrointestinal: soft, nontender, BS WNL, no r/g,no palpable masses.  Musculoskeletal: No muscle atrophy/wasting. M/S 5/5 throughout, Extremities without ischemic changes. 1+ pitting edema in both ankles. All toes bilaterally are pink with brisk capillary refill. Left great toenail is dark, but the base of the nail is normal  pink color.   Neurologic: A&O X 3; Appropriate Affect,  Speech is normal CN 2-12 intact, Pain and light touch intact in extremities, Motor exam as listed above    Assessment: Benjamin Patel is a 83 y.o. male who has no history of stroke or TIA. He has no steal symptoms in his upper extremities. He denies claudication symptoms in his legs with walking. His life is significantly impacted by intermittent left great toe severe pain for 1-2 years, see Plan. He has 2+ palpable bilateral pedal pulses, all toes are pink and warm with brisk capillary refill.   DATA Today's carotid Duplex suggests bilateral internal carotid artery stenosis present in the less than 40%. Distal right CCA stenosis >50% Bilateral ECA stenosis  is present.  Right vertebral artery is not visualized, left is antegrade. Triphasic (normal) bilateral subclavian arteries. No significant change compared to exams on 09-28-14 and 06-03-15.    Plan: Follow-up in 1 year with Carotid Duplex scan.  Pt requested a referral to [facility] for regular diabetic foot exam and nails trimming. Possibly the podiatrist will have some suggestions to address intermittent left great toe severe pain for 1-2 years; pt states gabapentin is not helping, topicals are not helping: 4% Lidocaine patch, Aspercreme, etc.    I discussed in depth with the patient the nature of atherosclerosis, and emphasized the importance of maximal medical management including strict control of blood pressure, blood glucose, and lipid levels, obtaining regular exercise, and continued cessation of smoking.  The patient is aware that without maximal medical management the underlying atherosclerotic disease process will progress, limiting the benefit of any interventions. The patient was given information about stroke prevention and what symptoms should prompt the patient to seek immediate medical care. Thank you for allowing [place] to participate in this patient's  care.  Clemon Chambers, RN, MSN, FNP-C [facility] Office: [phone]  Clinic Physician: Early  06/12/16 11:27 AM

## 2016-06-26 DIAGNOSIS — E1140 Type 2 diabetes mellitus with diabetic neuropathy, unspecified: Secondary | ICD-10-CM | POA: Diagnosis not present

## 2016-06-26 DIAGNOSIS — I10 Essential (primary) hypertension: Secondary | ICD-10-CM | POA: Diagnosis not present

## 2016-07-03 ENCOUNTER — Encounter: Payer: Self-pay | Admitting: Podiatry

## 2016-07-03 ENCOUNTER — Ambulatory Visit (INDEPENDENT_AMBULATORY_CARE_PROVIDER_SITE_OTHER): Payer: Medicare Other | Admitting: Podiatry

## 2016-07-03 VITALS — BP 158/77 | HR 53

## 2016-07-03 DIAGNOSIS — I6523 Occlusion and stenosis of bilateral carotid arteries: Secondary | ICD-10-CM | POA: Diagnosis not present

## 2016-07-03 DIAGNOSIS — B351 Tinea unguium: Secondary | ICD-10-CM

## 2016-07-03 DIAGNOSIS — E1142 Type 2 diabetes mellitus with diabetic polyneuropathy: Secondary | ICD-10-CM | POA: Diagnosis not present

## 2016-07-03 NOTE — Patient Instructions (Signed)

## 2016-07-03 NOTE — Progress Notes (Signed)
   Subjective:    Patient ID: Benjamin Patel, male    DOB: 09-28-1927, 83 y.o.   MRN: [id number]  HPI This patient presents today complaining that his toenails are thick and elongated and are occult walking wearing shoes and requests toenail debridement. Patient states in the past she's been able to get this service provided by his physician's office, however, the physicians offices no longer providing the service and referred him to our office Patient is diabetic and denies history of foot ulceration, claudication or amputation Patient former smoker discontinue 1978 Patient denies history of burning, numbness, pain  Patient's wife is present in the treatment room   Review of Systems  All other systems reviewed and are negative.      Objective:   Physical Exam  Orientated 3  Vascular: Bilateral peripheral pitting edema bilaterally DP and PT pulses 2/4 bilaterally Capillary reflex delay bilaterally  Neurological: Sensation to 10 g monofilament wire intact 2/5 bilaterally Vibratory sensation nonreactive bilaterally Ankle reflexes are reactive bilaterally  Dermatological: No open skin lesions bilaterally Atrophic skin with absent hair growth bilaterally The toenails are elongated, discolored, brittle, deformed and tender to direct palpation 6-10  Musculoskeletal: This is no restriction ankle, subtalar, midtarsal joints bilaterally Manual motor testing dorsi flexion, plantar flexion 5/5 bilaterally      Assessment & Plan:   Assessment: Diabetic peripheral neuropathy Symptomatic mycotic toenails 6-10  Plan: Debridement toenails 6-10 mechanically and left without any bleeding  Reappoint 3 months

## 2016-07-16 ENCOUNTER — Telehealth: Payer: Self-pay | Admitting: *Deleted

## 2016-07-16 NOTE — Telephone Encounter (Signed)
CALLED PATIENT TO INFORM OF CT FOR 08-08-16 - ARRIVAL TIME - 10:45 AM @ [facility], NO RESTRICTIONS TO TEST DUE TO ORDER WITH NO CONTRAST, PT. TO RETURN ON 08-09-16 @ 10:45 AM FOR RESULTS WITH DR. KINARD, SPOKE WITH HIS WIFE [name] AND SHE IS AWARE OF THESE APPTS

## 2016-07-24 DIAGNOSIS — R358 Other polyuria: Secondary | ICD-10-CM | POA: Diagnosis not present

## 2016-07-24 DIAGNOSIS — N390 Urinary tract infection, site not specified: Secondary | ICD-10-CM | POA: Diagnosis not present

## 2016-08-01 DIAGNOSIS — M109 Gout, unspecified: Secondary | ICD-10-CM | POA: Diagnosis not present

## 2016-08-01 DIAGNOSIS — Z79899 Other long term (current) drug therapy: Secondary | ICD-10-CM | POA: Diagnosis not present

## 2016-08-01 DIAGNOSIS — E790 Hyperuricemia without signs of inflammatory arthritis and tophaceous disease: Secondary | ICD-10-CM | POA: Diagnosis not present

## 2016-08-08 ENCOUNTER — Ambulatory Visit (HOSPITAL_COMMUNITY)
Admission: RE | Admit: 2016-08-08 | Discharge: 2016-08-08 | Disposition: A | Discharge status: Home / Self Care | Payer: Medicare Other | Source: Ambulatory Visit | Attending: Radiation Oncology | Admitting: Radiation Oncology

## 2016-08-08 DIAGNOSIS — Z923 Personal history of irradiation: Secondary | ICD-10-CM | POA: Diagnosis not present

## 2016-08-08 DIAGNOSIS — R911 Solitary pulmonary nodule: Secondary | ICD-10-CM

## 2016-08-08 DIAGNOSIS — I700 Atherosclerosis of aorta: Secondary | ICD-10-CM | POA: Diagnosis not present

## 2016-08-08 DIAGNOSIS — I2510 Atherosclerotic heart disease of native coronary artery without angina pectoris: Secondary | ICD-10-CM | POA: Diagnosis not present

## 2016-08-08 DIAGNOSIS — C3412 Malignant neoplasm of upper lobe, left bronchus or lung: Secondary | ICD-10-CM | POA: Diagnosis not present

## 2016-08-08 DIAGNOSIS — J439 Emphysema, unspecified: Secondary | ICD-10-CM | POA: Diagnosis not present

## 2016-08-09 ENCOUNTER — Ambulatory Visit
Admission: RE | Admit: 2016-08-09 | Discharge: 2016-08-09 | Disposition: A | Discharge status: Home / Self Care | Payer: Medicare Other | Source: Ambulatory Visit | Attending: Radiation Oncology | Admitting: Radiation Oncology

## 2016-08-09 ENCOUNTER — Ambulatory Visit: Payer: Self-pay | Admitting: Radiation Oncology

## 2016-08-09 ENCOUNTER — Encounter: Payer: Self-pay | Admitting: Radiation Oncology

## 2016-08-09 VITALS — BP 141/64 | HR 71 | Temp 97.9°F | Ht 69.0 in | Wt 176.4 lb

## 2016-08-09 DIAGNOSIS — C3492 Malignant neoplasm of unspecified part of left bronchus or lung: Secondary | ICD-10-CM

## 2016-08-09 DIAGNOSIS — Z08 Encounter for follow-up examination after completed treatment for malignant neoplasm: Secondary | ICD-10-CM | POA: Diagnosis not present

## 2016-08-09 DIAGNOSIS — J432 Centrilobular emphysema: Secondary | ICD-10-CM | POA: Diagnosis not present

## 2016-08-09 DIAGNOSIS — J439 Emphysema, unspecified: Secondary | ICD-10-CM

## 2016-08-09 DIAGNOSIS — Z85118 Personal history of other malignant neoplasm of bronchus and lung: Secondary | ICD-10-CM | POA: Diagnosis not present

## 2016-08-09 DIAGNOSIS — R911 Solitary pulmonary nodule: Secondary | ICD-10-CM

## 2016-08-09 NOTE — Progress Notes (Signed)
Benjamin Patel is here for follow up.  He denies having any pain or increase in shortness of breath.  He denies having a cough or fatigue.  He is currently taking prednisone for gout.  BP (!) 141/64 (BP Location: Left Arm, Patient Position: Sitting)   Pulse 71   Temp 97.9 F (36.6 C) (Oral)   Ht 5\' 9"  (1.753 m)   Wt 176 lb 6.4 oz (80 kg)   SpO2 96%   BMI 26.05 kg/m    Wt Readings from Last 3 Encounters:  08/09/16 176 lb 6.4 oz (80 kg)  06/12/16 174 lb 6 oz (79.1 kg)  05/18/16 173 lb (78.5 kg)

## 2016-08-09 NOTE — Progress Notes (Signed)
Radiation Oncology         [phone]) [phone] ________________________________  Name: GUERIN LASHOMB MRN: [id number]  Date: 08/09/2016  DOB: 11/26/1927     Follow-Up Visit Note  CC: Deland Pretty, MD  [name], *  Diagnosis: PET positive nodule in the left upper lobe    Interval Since Last Radiation: 1 year 7 months  November 28 through December 24, 2014: 50 gray in 10 fractions  Narrative:  The patient returns today for routine follow-up. He denies having any pain or increase in shortness of breath. He denies having a cough or fatigue. He is currently taking prednisone for gout.   Most recent CT Chest on 08/08/2016 shows no findings to suggest local recurrence of disease or definite metastatic disease in the thorax. Of note, there was mild diffuse bronchial wall thickening with moderate to severe centrilobular and mild paraseptal emphysema.   ALLERGIES:  is allergic to ace inhibitors; codeine; indomethacin; and losartan.  Meds: Current Outpatient Prescriptions  Medication Sig Dispense Refill  . allopurinol (ZYLOPRIM) 100 MG tablet 100 mg daily.    Marland Kitchen amLODipine (NORVASC) 5 MG tablet     . aspirin EC 81 MG tablet Take 81 mg by mouth daily.    . cloNIDine (CATAPRES) 0.2 MG tablet TAKE ONE TABLET BY MOUTH TWICE DAILY 180 tablet 0  . Colchicine 0.6 MG CAPS Take by mouth.    Marland Kitchen FLAXSEED, LINSEED, PO Take by mouth.    . gabapentin (NEURONTIN) 400 MG capsule Take 2 capsules (800 mg total) by mouth 2 (two) times daily. 60 capsule 12  . metoprolol succinate (TOPROL-XL) 100 MG 24 hr tablet Take 1 tablet (100 mg total) by mouth daily. Take with or immediately following a meal. 30 tablet 11  . predniSONE (DELTASONE) 5 MG tablet     . rosuvastatin (CRESTOR) 10 MG tablet      No current facility-administered medications for this encounter.     Physical Findings:  height is 5\' 9"  (1.753 m) and weight is 176 lb 6.4 oz (80 kg). His oral temperature is 97.9 F (36.6 C). His blood  pressure is 141/64 (abnormal) and his pulse is 71. His oxygen saturation is 96%.   The patient is in no acute distress. Patient is alert and oriented. The heart has a regular rhythm and rate. The lungs are clear to auscultation bilaterally. No palpable supraclavicular, cervical, or axillary adenopathy. Abdomen is soft and non-tender.   Lab Findings: Lab Results  Component Value Date   WBC 9.5 11/16/2014   HGB 14.8 11/16/2014   HCT 43.8 11/16/2014   MCV 89.0 11/16/2014   PLT 219 11/16/2014    Radiographic Findings: Ct Chest Wo Contrast  Result Date: 08/08/2016 CLINICAL DATA:  83 year old male with history of left-sided lung cancer diagnosed in June 2014 status post left lower lobe wedge resection followed by radiation therapy completed in December 2016. Followup study. EXAM: CT CHEST WITHOUT CONTRAST TECHNIQUE: Multidetector CT imaging of the chest was performed following the standard protocol without IV contrast. COMPARISON:  Chest CT 01/31/2016. FINDINGS: Cardiovascular: Heart size is borderline enlarged. There is no significant pericardial fluid, thickening or pericardial calcification. There is aortic atherosclerosis, as well as atherosclerosis of the great vessels of the mediastinum and the coronary arteries, including calcified atherosclerotic plaque in the left main, left anterior descending, left circumflex and right coronary arteries. Calcifications of the aortic valve. Mediastinum/Nodes: No pathologically enlarged mediastinal or hilar lymph nodes. Please note that accurate exclusion of hilar adenopathy  is limited on noncontrast CT scans. Esophagus is unremarkable in appearance. No axillary lymphadenopathy. Lungs/Pleura: Postoperative changes of wedge resection are noted in the left lower lobe. No unexpected soft tissue mass adjacent to the suture line to suggest locally recurrent disease. There continues to be some mild nodular pleuroparenchymal thickening in the anterior aspect of the left  upper lobe near the apex, which is stable compared to prior studies, most compatible with chronic scarring. Decreased size of what is now a 1-2 mm subpleural nodule in the periphery of the right lower lobe (image 110 of series 5), previously 4 mm. No suspicious appearing pulmonary nodules or masses are noted on today's examination. No acute consolidative airspace disease. No pleural effusions. Diffuse bronchial wall thickening with moderate to severe centrilobular and mild paraseptal emphysema. Upper Abdomen: Aortic atherosclerosis. Musculoskeletal: There are no aggressive appearing lytic or blastic lesions noted in the visualized portions of the skeleton. IMPRESSION: 1. Status post left lower lobe wedge resection with no findings to suggest local recurrence of disease or definite metastatic disease in the thorax. 2. Mild diffuse bronchial wall thickening with moderate to severe centrilobular and mild paraseptal emphysema; imaging findings suggestive of underlying COPD. 3. Aortic atherosclerosis, in addition to left main and 3 vessel coronary artery disease. 4. There are calcifications of the aortic valve. Echocardiographic correlation for evaluation of potential valvular dysfunction may be warranted if clinically indicated. Aortic Atherosclerosis (ICD10-I70.0) and Emphysema (ICD10-J43.9). Electronically Signed   By: Vinnie Langton M.D.   On: 08/08/2016 15:30    Impression: The patient is doing well at this time without any appreciable side effects from his treatment. No signs of recurrence on clinical exam today. Chest CT scan yesterday shows no findings to suggest local recurrence of disease or definite metastatic disease in the thorax.  Plan:  Routine follow up in 6 months. He will have a CT scan prior to his follow up appointment.  ____________________________________  Blair Promise, PhD, MD  This document serves as a record of services personally performed by Gery Pray, MD. It was created on  his behalf by Arlyce Harman, a trained medical scribe. The creation of this record is based on the scribe's personal observations and the provider's statements to them. This document has been checked and approved by the attending provider.

## 2016-08-15 DIAGNOSIS — E790 Hyperuricemia without signs of inflammatory arthritis and tophaceous disease: Secondary | ICD-10-CM | POA: Diagnosis not present

## 2016-10-02 ENCOUNTER — Ambulatory Visit (INDEPENDENT_AMBULATORY_CARE_PROVIDER_SITE_OTHER): Payer: Medicare Other | Admitting: Podiatry

## 2016-10-02 ENCOUNTER — Encounter: Payer: Self-pay | Admitting: Podiatry

## 2016-10-02 VITALS — BP 165/85 | HR 96

## 2016-10-02 DIAGNOSIS — B351 Tinea unguium: Secondary | ICD-10-CM | POA: Diagnosis not present

## 2016-10-02 DIAGNOSIS — E1142 Type 2 diabetes mellitus with diabetic polyneuropathy: Secondary | ICD-10-CM

## 2016-10-02 NOTE — Progress Notes (Signed)
Patient ID: BRENTLY VOORHIS, male   DOB: [birth year], 83 y.o.   MRN: [id number]    HPI This patient presents today complaining that his toenails are thick and elongated and are occult walking wearing shoes and requests toenail debridement. Patient states in the past she's been able to get this service provided by his physician's office, however, the physicians offices no longer providing the service and referred him to our office Patient is diabetic and denies history of foot ulceration, claudication or amputation Patient former smoker discontinue 1978 Patient denies history of burning, numbness, pain  Patient's wife is present in the treatment room  Objective: Orientated 3  Vascular: Bilateral peripheral pitting edema bilaterally DP and PT pulses 2/4 bilaterally Capillary reflex delay bilaterally  Neurological: Sensation to 10 g monofilament wire intact 2/5 bilaterally Vibratory sensation nonreactive bilaterally Ankle reflexes are reactive bilaterally  Dermatological: No open skin lesions bilaterally Atrophic skin with absent hair growth bilaterally The toenails are elongated, discolored, brittle, deformed and tender to direct palpation 6-10  Musculoskeletal: This is no restriction ankle, subtalar, midtarsal joints bilaterally Manual motor testing dorsi flexion, plantar flexion 5/5 bilaterally  Assessment: Diabetic peripheral neuropathy Symptomatic mycotic toenails 6-10  Plan: Debridement toenails 6-10 mechanically and left without any bleeding  Reappoint 3 months

## 2016-10-02 NOTE — Patient Instructions (Signed)

## 2016-10-11 DIAGNOSIS — H2512 Age-related nuclear cataract, left eye: Secondary | ICD-10-CM | POA: Diagnosis not present

## 2016-10-11 DIAGNOSIS — E119 Type 2 diabetes mellitus without complications: Secondary | ICD-10-CM | POA: Diagnosis not present

## 2016-10-11 DIAGNOSIS — H2511 Age-related nuclear cataract, right eye: Secondary | ICD-10-CM | POA: Diagnosis not present

## 2016-10-11 DIAGNOSIS — D3132 Benign neoplasm of left choroid: Secondary | ICD-10-CM | POA: Diagnosis not present

## 2016-10-26 DIAGNOSIS — R58 Hemorrhage, not elsewhere classified: Secondary | ICD-10-CM | POA: Diagnosis not present

## 2016-10-26 DIAGNOSIS — Z23 Encounter for immunization: Secondary | ICD-10-CM | POA: Diagnosis not present

## 2016-11-15 DIAGNOSIS — Z79899 Other long term (current) drug therapy: Secondary | ICD-10-CM | POA: Diagnosis not present

## 2016-11-15 DIAGNOSIS — E790 Hyperuricemia without signs of inflammatory arthritis and tophaceous disease: Secondary | ICD-10-CM | POA: Diagnosis not present

## 2016-11-15 DIAGNOSIS — R600 Localized edema: Secondary | ICD-10-CM | POA: Diagnosis not present

## 2016-11-15 DIAGNOSIS — M109 Gout, unspecified: Secondary | ICD-10-CM | POA: Diagnosis not present

## 2016-12-31 ENCOUNTER — Encounter: Payer: Self-pay | Admitting: Podiatry

## 2016-12-31 ENCOUNTER — Ambulatory Visit: Payer: Medicare Other | Admitting: Podiatry

## 2016-12-31 ENCOUNTER — Ambulatory Visit (INDEPENDENT_AMBULATORY_CARE_PROVIDER_SITE_OTHER): Payer: Medicare Other | Admitting: Podiatry

## 2016-12-31 DIAGNOSIS — B351 Tinea unguium: Secondary | ICD-10-CM | POA: Diagnosis not present

## 2016-12-31 DIAGNOSIS — E1142 Type 2 diabetes mellitus with diabetic polyneuropathy: Secondary | ICD-10-CM | POA: Diagnosis not present

## 2017-01-02 NOTE — Progress Notes (Signed)
Subjective:   Patient ID: Benjamin Patel, male   DOB: 83 y.o.   MRN: [id number]   HPI Patient presents with significant thickness of nailbeds 1-5 both feet with yellow brittle debris and pain   ROS      Objective:  Physical Exam  Neurovascular status intact with thick yellow brittle nailbeds 1-5 both feet     Assessment:  Mycotic nail infection with pain 1-5 both feet     Plan:  Debride painful nailbeds 1-5 both feet with no iatrogenic bleeding noted

## 2017-01-29 ENCOUNTER — Telehealth: Payer: Self-pay | Admitting: *Deleted

## 2017-01-29 NOTE — Telephone Encounter (Signed)
Called patient to inform of Ct for 02-08-17 - arrival time - 11:15 am @ [facility], no restrictions to test, patient to get results with Dr. Sondra Come on 02-11-17 @ 10:30 am, spoke with patient's wife and she is aware of these appts.

## 2017-02-08 ENCOUNTER — Ambulatory Visit (HOSPITAL_COMMUNITY)
Admission: RE | Admit: 2017-02-08 | Discharge: 2017-02-08 | Disposition: A | Discharge status: Home / Self Care | Payer: Medicare Other | Source: Ambulatory Visit | Attending: Radiation Oncology | Admitting: Radiation Oncology

## 2017-02-08 DIAGNOSIS — I700 Atherosclerosis of aorta: Secondary | ICD-10-CM | POA: Insufficient documentation

## 2017-02-08 DIAGNOSIS — J439 Emphysema, unspecified: Secondary | ICD-10-CM | POA: Diagnosis not present

## 2017-02-08 DIAGNOSIS — C3492 Malignant neoplasm of unspecified part of left bronchus or lung: Secondary | ICD-10-CM | POA: Diagnosis not present

## 2017-02-11 ENCOUNTER — Encounter: Payer: Self-pay | Admitting: Radiation Oncology

## 2017-02-11 ENCOUNTER — Ambulatory Visit
Admission: RE | Admit: 2017-02-11 | Discharge: 2017-02-11 | Disposition: A | Discharge status: Home / Self Care | Payer: Medicare Other | Source: Ambulatory Visit | Attending: Radiation Oncology | Admitting: Radiation Oncology

## 2017-02-11 ENCOUNTER — Other Ambulatory Visit: Payer: Self-pay

## 2017-02-11 DIAGNOSIS — Z885 Allergy status to narcotic agent status: Secondary | ICD-10-CM | POA: Insufficient documentation

## 2017-02-11 DIAGNOSIS — Z08 Encounter for follow-up examination after completed treatment for malignant neoplasm: Secondary | ICD-10-CM | POA: Insufficient documentation

## 2017-02-11 DIAGNOSIS — Z79899 Other long term (current) drug therapy: Secondary | ICD-10-CM | POA: Diagnosis not present

## 2017-02-11 DIAGNOSIS — Z85118 Personal history of other malignant neoplasm of bronchus and lung: Secondary | ICD-10-CM | POA: Diagnosis not present

## 2017-02-11 DIAGNOSIS — Z7982 Long term (current) use of aspirin: Secondary | ICD-10-CM | POA: Insufficient documentation

## 2017-02-11 DIAGNOSIS — Z923 Personal history of irradiation: Secondary | ICD-10-CM | POA: Insufficient documentation

## 2017-02-11 DIAGNOSIS — J439 Emphysema, unspecified: Secondary | ICD-10-CM | POA: Diagnosis not present

## 2017-02-11 DIAGNOSIS — M109 Gout, unspecified: Secondary | ICD-10-CM | POA: Diagnosis not present

## 2017-02-11 DIAGNOSIS — C3492 Malignant neoplasm of unspecified part of left bronchus or lung: Secondary | ICD-10-CM

## 2017-02-11 NOTE — Progress Notes (Signed)
Radiation Oncology         [phone] ________________________________  Name: Benjamin Patel MRN: [id number]  Date: 02/11/2017  DOB: 02-Jan-1928     Follow-Up Visit Note  CC: Deland Pretty, MD  [name], *  Diagnosis: PET positive nodule in the left upper lobe    Interval Since Last Radiation: 2 years, 1 month, 19 days  November 28 through December 24, 2014: 50 gray in 10 fractions   Narrative:  The patient returns today for routine follow-up. He is accompanied by his wife today. He denies starting a new medication since his last visit. He denies taking colchicine at this time for his gout. He denies prior medical hx of CHF.   Since his last visit to the office, he underwent a CT chest without contrast on 02/08/2017 with results revealing: IMPRESSION: Patient status post left lower lobe wedge resection. No findings to suggest localized recurrence or metastatic disease. Extensive emphysematous change. Aortic Atherosclerosis and Emphysema.  On review of systems, he reports exertional SOB and nasal drainage. He notes that he previously followed up with his PCP regarding this and was prescribed flonase which he hasn't noticed a difference and d/c use. He denies CP, cough, hemoptysis, and any other symptoms.    ALLERGIES:  is allergic to ace inhibitors; codeine; indomethacin; and losartan.  Meds: Current Outpatient Medications  Medication Sig Dispense Refill  . allopurinol (ZYLOPRIM) 100 MG tablet 100 mg daily.    Marland Kitchen amLODipine (NORVASC) 5 MG tablet     . aspirin EC 81 MG tablet Take 81 mg by mouth daily.    . cloNIDine (CATAPRES) 0.2 MG tablet TAKE ONE TABLET BY MOUTH TWICE DAILY 180 tablet 0  . FLAXSEED, LINSEED, PO Take by mouth.    . gabapentin (NEURONTIN) 400 MG capsule Take 2 capsules (800 mg total) by mouth 2 (two) times daily. 60 capsule 12  . metoprolol succinate (TOPROL-XL) 100 MG 24 hr tablet Take 1 tablet (100 mg total) by mouth daily. Take with or  immediately following a meal. 30 tablet 11  . rosuvastatin (CRESTOR) 10 MG tablet     . Colchicine 0.6 MG CAPS Take by mouth.     No current facility-administered medications for this encounter.     Physical Findings:  height is 5\' 9"  (1.753 m) and weight is 172 lb 9.6 oz (78.3 kg). His oral temperature is 97.6 F (36.4 C). His blood pressure is 125/64 and his pulse is 74. His oxygen saturation is 98%.   The patient is in no acute distress. Patient is alert and oriented. The heart has a regular rhythm and rate. The lungs are clear to auscultation bilaterally. No palpable supraclavicular, cervical, or axillary adenopathy. Abdomen is soft and non-tender.   Lab Findings: Lab Results  Component Value Date   WBC 9.5 11/16/2014   HGB 14.8 11/16/2014   HCT 43.8 11/16/2014   MCV 89.0 11/16/2014   PLT 219 11/16/2014    Radiographic Findings: Ct Chest Wo Contrast  Result Date: 02/08/2017 CLINICAL DATA:  History of lung cancer diagnosed in 2014 status post radiation and partial left lung resection. EXAM: CT CHEST WITHOUT CONTRAST TECHNIQUE: Multidetector CT imaging of the chest was performed following the standard protocol without IV contrast. COMPARISON:  Chest CT 08/08/2016. FINDINGS: Cardiovascular: Heart is enlarged. No pericardial effusion. Coronary arterial vascular calcifications. Thoracic aortic vascular calcifications. Mediastinum/Nodes: No enlarged axillary, mediastinal or hilar lymphadenopathy. Lungs/Pleura: Central airways are patent. Extensive centrilobular and paraseptal emphysematous changes predominantly involving  the upper lobes bilaterally. Unchanged nodular pleuroparenchymal thickening anterior left upper lobe near the apex, suggestive of scarring. Stable 11mm subpleural nodule medial right lower lobe (image 99; series 5). Postprocedural changes compatible with wedge resection within the left lower lobe. This is stable in appearance. Unchanged tiny peripheral right lower lobe nodule  measuring 2 mm (image 111; series 5). No pleural effusion or pneumothorax. Upper Abdomen: No acute process. Musculoskeletal: Thoracic spine degenerative changes. No aggressive or acute appearing osseous lesions. IMPRESSION: 1. Patient status post left lower lobe wedge resection. No findings to suggest localized recurrence or metastatic disease. 2. Extensive emphysematous change. Aortic Atherosclerosis (ICD10-I70.0) and Emphysema (ICD10-J43.9). Electronically Signed   By: Lovey Newcomer M.D.   On: 02/08/2017 15:23    Impression: The patient is doing well at this time without any appreciable side effects from his treatment. No signs of recurrence on clinical exam today. Recent chest CT scan shows no findings to suggest local recurrence of disease or  metastatic disease in the thorax.   Plan:  He will have a chest CT in 6 months with a routine follow up following that.   ____________________________________  Blair Promise, PhD, MD  This document serves as a record of services personally performed by Gery Pray, MD. It was created on his behalf by [facility], a trained medical scribe. The creation of this record is based on the scribe's personal observations and the provider's statements to them. This document has been checked and approved by the attending provider.

## 2017-02-11 NOTE — Progress Notes (Signed)
Benjamin Patel is here for follow up.  He denies having any pain.  He continues to have shortness of breath with activity.  He denies having any cough or hemoptysis.  He reports having fatigue and tiring easily.  BP 125/64 (BP Location: Left Arm, Patient Position: Sitting)   Pulse 74   Temp 97.6 F (36.4 C) (Oral)   Ht 5\' 9"  (1.753 m)   Wt 172 lb 9.6 oz (78.3 kg)   SpO2 98%   BMI 25.49 kg/m    Wt Readings from Last 3 Encounters:  02/11/17 172 lb 9.6 oz (78.3 kg)  08/09/16 176 lb 6.4 oz (80 kg)  06/12/16 174 lb 6 oz (79.1 kg)

## 2017-03-05 DIAGNOSIS — R358 Other polyuria: Secondary | ICD-10-CM | POA: Diagnosis not present

## 2017-03-05 DIAGNOSIS — N3001 Acute cystitis with hematuria: Secondary | ICD-10-CM | POA: Diagnosis not present

## 2017-03-05 DIAGNOSIS — N390 Urinary tract infection, site not specified: Secondary | ICD-10-CM | POA: Diagnosis not present

## 2017-03-18 ENCOUNTER — Other Ambulatory Visit: Payer: Self-pay | Admitting: Diagnostic Neuroimaging

## 2017-03-20 ENCOUNTER — Other Ambulatory Visit: Payer: Self-pay | Admitting: Diagnostic Neuroimaging

## 2017-03-25 ENCOUNTER — Other Ambulatory Visit: Payer: Self-pay | Admitting: Diagnostic Neuroimaging

## 2017-04-01 ENCOUNTER — Ambulatory Visit: Payer: Medicare Other | Admitting: Podiatry

## 2017-04-15 ENCOUNTER — Ambulatory Visit (INDEPENDENT_AMBULATORY_CARE_PROVIDER_SITE_OTHER): Payer: Medicare Other | Admitting: Podiatry

## 2017-04-15 ENCOUNTER — Encounter: Payer: Self-pay | Admitting: Podiatry

## 2017-04-15 DIAGNOSIS — E1142 Type 2 diabetes mellitus with diabetic polyneuropathy: Secondary | ICD-10-CM

## 2017-04-15 DIAGNOSIS — M205X2 Other deformities of toe(s) (acquired), left foot: Secondary | ICD-10-CM

## 2017-04-15 DIAGNOSIS — B351 Tinea unguium: Secondary | ICD-10-CM | POA: Diagnosis not present

## 2017-04-15 DIAGNOSIS — M779 Enthesopathy, unspecified: Secondary | ICD-10-CM | POA: Diagnosis not present

## 2017-04-15 MED ORDER — TRIAMCINOLONE ACETONIDE 10 MG/ML IJ SUSP
10.0000 mg | Freq: Once | INTRAMUSCULAR | Status: AC
  Administered 2017-04-15: 10 mg

## 2017-04-15 NOTE — Progress Notes (Signed)
Subjective:   Patient ID: Benjamin Patel, male   DOB: 84 y.o.   MRN: [id number]   HPI Patient presents with elongated nails 1-5 both feet that are thick and painful and hard for him to cut   ROS      Objective:  Physical Exam  Neurovascular status intact with thick yellow brittle nailbeds 1-5 both feet that are painful     Assessment:  Mycotic nail infection with pain 1-5 both feet     Plan:  Debride painful nailbeds 1-5 both feet with no iatrogenic bleeding noted

## 2017-05-09 DIAGNOSIS — E1140 Type 2 diabetes mellitus with diabetic neuropathy, unspecified: Secondary | ICD-10-CM | POA: Diagnosis not present

## 2017-05-09 DIAGNOSIS — I10 Essential (primary) hypertension: Secondary | ICD-10-CM | POA: Diagnosis not present

## 2017-05-09 DIAGNOSIS — N390 Urinary tract infection, site not specified: Secondary | ICD-10-CM | POA: Diagnosis not present

## 2017-05-09 DIAGNOSIS — E7800 Pure hypercholesterolemia, unspecified: Secondary | ICD-10-CM | POA: Diagnosis not present

## 2017-05-14 DIAGNOSIS — E1140 Type 2 diabetes mellitus with diabetic neuropathy, unspecified: Secondary | ICD-10-CM | POA: Diagnosis not present

## 2017-05-14 DIAGNOSIS — L578 Other skin changes due to chronic exposure to nonionizing radiation: Secondary | ICD-10-CM | POA: Diagnosis not present

## 2017-05-14 DIAGNOSIS — R319 Hematuria, unspecified: Secondary | ICD-10-CM | POA: Diagnosis not present

## 2017-05-14 DIAGNOSIS — I498 Other specified cardiac arrhythmias: Secondary | ICD-10-CM | POA: Diagnosis not present

## 2017-05-14 DIAGNOSIS — E1121 Type 2 diabetes mellitus with diabetic nephropathy: Secondary | ICD-10-CM | POA: Diagnosis not present

## 2017-05-14 DIAGNOSIS — R300 Dysuria: Secondary | ICD-10-CM | POA: Diagnosis not present

## 2017-05-14 DIAGNOSIS — I6523 Occlusion and stenosis of bilateral carotid arteries: Secondary | ICD-10-CM | POA: Diagnosis not present

## 2017-05-14 DIAGNOSIS — E7800 Pure hypercholesterolemia, unspecified: Secondary | ICD-10-CM | POA: Diagnosis not present

## 2017-05-14 DIAGNOSIS — Z0001 Encounter for general adult medical examination with abnormal findings: Secondary | ICD-10-CM | POA: Diagnosis not present

## 2017-05-14 DIAGNOSIS — M40209 Unspecified kyphosis, site unspecified: Secondary | ICD-10-CM | POA: Diagnosis not present

## 2017-05-14 DIAGNOSIS — I499 Cardiac arrhythmia, unspecified: Secondary | ICD-10-CM | POA: Diagnosis not present

## 2017-05-14 DIAGNOSIS — I10 Essential (primary) hypertension: Secondary | ICD-10-CM | POA: Diagnosis not present

## 2017-05-14 DIAGNOSIS — M40203 Unspecified kyphosis, cervicothoracic region: Secondary | ICD-10-CM | POA: Diagnosis not present

## 2017-05-14 DIAGNOSIS — N3001 Acute cystitis with hematuria: Secondary | ICD-10-CM | POA: Diagnosis not present

## 2017-05-14 DIAGNOSIS — N390 Urinary tract infection, site not specified: Secondary | ICD-10-CM | POA: Diagnosis not present

## 2017-05-14 DIAGNOSIS — I6529 Occlusion and stenosis of unspecified carotid artery: Secondary | ICD-10-CM | POA: Diagnosis not present

## 2017-05-15 DIAGNOSIS — M109 Gout, unspecified: Secondary | ICD-10-CM | POA: Diagnosis not present

## 2017-05-15 DIAGNOSIS — M79672 Pain in left foot: Secondary | ICD-10-CM | POA: Diagnosis not present

## 2017-05-15 DIAGNOSIS — Z79899 Other long term (current) drug therapy: Secondary | ICD-10-CM | POA: Diagnosis not present

## 2017-05-15 DIAGNOSIS — E790 Hyperuricemia without signs of inflammatory arthritis and tophaceous disease: Secondary | ICD-10-CM | POA: Diagnosis not present

## 2017-05-21 DIAGNOSIS — N390 Urinary tract infection, site not specified: Secondary | ICD-10-CM | POA: Diagnosis not present

## 2017-05-21 DIAGNOSIS — R300 Dysuria: Secondary | ICD-10-CM | POA: Diagnosis not present

## 2017-05-28 DIAGNOSIS — I10 Essential (primary) hypertension: Secondary | ICD-10-CM | POA: Diagnosis not present

## 2017-05-28 DIAGNOSIS — E7800 Pure hypercholesterolemia, unspecified: Secondary | ICD-10-CM | POA: Diagnosis not present

## 2017-05-28 DIAGNOSIS — X32XXXA Exposure to sunlight, initial encounter: Secondary | ICD-10-CM | POA: Diagnosis not present

## 2017-05-28 DIAGNOSIS — L570 Actinic keratosis: Secondary | ICD-10-CM | POA: Diagnosis not present

## 2017-06-05 DIAGNOSIS — Z79899 Other long term (current) drug therapy: Secondary | ICD-10-CM | POA: Diagnosis not present

## 2017-06-05 DIAGNOSIS — M109 Gout, unspecified: Secondary | ICD-10-CM | POA: Diagnosis not present

## 2017-06-05 DIAGNOSIS — M810 Age-related osteoporosis without current pathological fracture: Secondary | ICD-10-CM | POA: Diagnosis not present

## 2017-06-05 DIAGNOSIS — M4854XA Collapsed vertebra, not elsewhere classified, thoracic region, initial encounter for fracture: Secondary | ICD-10-CM | POA: Diagnosis not present

## 2017-06-13 ENCOUNTER — Ambulatory Visit (HOSPITAL_COMMUNITY)
Admission: RE | Admit: 2017-06-13 | Discharge: 2017-06-13 | Disposition: A | Discharge status: Home / Self Care | Payer: Medicare Other | Source: Ambulatory Visit | Attending: Family | Admitting: Family

## 2017-06-13 ENCOUNTER — Other Ambulatory Visit: Payer: Self-pay

## 2017-06-13 ENCOUNTER — Encounter: Payer: Self-pay | Admitting: Family

## 2017-06-13 ENCOUNTER — Ambulatory Visit (INDEPENDENT_AMBULATORY_CARE_PROVIDER_SITE_OTHER): Payer: Medicare Other | Admitting: Family

## 2017-06-13 VITALS — BP 132/71 | HR 83 | Temp 96.8°F | Resp 18 | Ht 68.5 in | Wt 163.0 lb

## 2017-06-13 DIAGNOSIS — R0989 Other specified symptoms and signs involving the circulatory and respiratory systems: Secondary | ICD-10-CM | POA: Diagnosis not present

## 2017-06-13 DIAGNOSIS — I6523 Occlusion and stenosis of bilateral carotid arteries: Secondary | ICD-10-CM

## 2017-06-13 NOTE — Progress Notes (Signed)
Chief Complaint: Follow up Extracranial Carotid Artery Stenosis   History of Present Illness  Benjamin Patel is a 84 y.o. male whom Dr. Donnetta Hutching  has been monitoring for known extracranial carotid artery stenosis with bilateral carotid bruits. He returns today for routine surveillance.  Patient has not had previous carotid artery intervention.  He denies any history of TIA or stroke symptoms.Specifically he denies a history of amaurosis fugax or monocular blindness, unilateral facial drooping, hemiplegia, or receptive or expressive aphasia.  Pt denies tingling, numbness, pain, or cold sensation in either hand or arm.  He denies non healing wounds, denies claudication symptoms with walking.   He was evaluated by a podiatrist for left great toe pain, has possible gout, had a corticosteroid injection in his toe which helped his pain.   He had 10 days of radiation tx in December 2016 directed at a small lesion in his lung, questionable whether this was cancer per pt. He has a CT chest follow up in every 6 months. He had a wedge resection left lung for cancer, June, 2014.    Pt Diabetic: Yes, he states his last A1C was 7.0, resumed the metformin  Pt smoker: former smoker, quit in 1978  Pt meds include: Statin : Yes ASA: yes Other anticoagulants/antiplatelets: no    Past Medical History:  Diagnosis Date  . Abnormal prostate exam   . Arthritis    Gout   . Asthma    as a child  . Carotid stenosis   . Carotid stenosis right side   Dr.Early does carotid ultrasounds yrly-last one Mar 14-report in epic  . Cholelithiasis   . COPD (chronic obstructive pulmonary disease) (HCC)    slight   . Enlarged prostate    slightly   . Essential hypertension, benign    takes Clonodine,Losartan,and Metoprolol and HCTZ  . Gout    no meds required  . History of bronchitis   . History of colon polyps   . History of shingles   . Lung cancer ([place]) dx'd 2014   LLL resection; xrt  .  Mixed hyperlipidemia    takes Pravastatin daily  . PAC (premature atrial contraction)   . Pneumonia 02/08/2012  . PVCs (premature ventricular contractions)   . Radiation 12/13/14-12/24/14   left upper lobe nodule 50 gray  . Renal artery stenosis (HCC)    right  . Renal insufficiency   . Skin cancer    Lung Ca- Left Lung  . Type II or unspecified type diabetes mellitus without mention of complication, not stated as uncontrolled    02/14/2016 diet controlled    Social History Social History   Tobacco Use  . Smoking status: Former Smoker    Packs/day: 1.50    Years: 30.00    Pack years: 45.00    Types: Cigarettes    Last attempt to quit: 01/16/1976    Years since quitting: 41.4  . Smokeless tobacco: Never Used  Substance Use Topics  . Alcohol use: No    Alcohol/week: 0.0 oz  . Drug use: No    Family History Family History  Problem Relation Age of Onset  . Heart disease Mother   . Diabetes Mother        AKA  Right   . Hypertension Mother   . Varicose Veins Mother   . Kidney disease Mother        One Kidney  . Cancer Father        Lung    Surgical  History Past Surgical History:  Procedure Laterality Date  . COLONOSCOPY    . NO PAST SURGERIES    . SEGMENTECOMY Left 06/16/2012   Procedure: left lower lobe superior SEGMENTECTOMY with node dissection;  Surgeon: [name], MD;  Location: [place];  Service: Thoracic;  Laterality: Left;  left superior  . VIDEO ASSISTED THORACOSCOPY Left 06/16/2012   Procedure: VIDEO ASSISTED THORACOSCOPY;  Surgeon: [name], MD;  Location: [place];  Service: Thoracic;  Laterality: Left;    Allergies  Allergen Reactions  . Ace Inhibitors Cough  . Codeine Other (See Comments)    GI Upset  . Indomethacin Other (See Comments)    Elevated BP  . Losartan Swelling    ANGIOEDEMA/     Current Outpatient Medications  Medication Sig Dispense Refill  . allopurinol (ZYLOPRIM) 100 MG tablet 100 mg daily.    Marland Kitchen amLODipine  (NORVASC) 5 MG tablet     . aspirin EC 81 MG tablet Take 81 mg by mouth daily.    . Calcium Carbonate (CALCIUM 600 PO) Take 600 mg by mouth daily.    . cholecalciferol (VITAMIN D) 1000 units tablet Take 1,000 Units by mouth daily.    . cloNIDine (CATAPRES) 0.2 MG tablet TAKE ONE TABLET BY MOUTH TWICE DAILY 180 tablet 0  . FLAXSEED, LINSEED, PO Take by mouth.    . gabapentin (NEURONTIN) 400 MG capsule Take 2 capsules (800 mg total) by mouth 2 (two) times daily. 60 capsule 12  . metFORMIN (GLUCOPHAGE-XR) 750 MG 24 hr tablet   3  . metoprolol succinate (TOPROL-XL) 100 MG 24 hr tablet Take 1 tablet (100 mg total) by mouth daily. Take with or immediately following a meal. 30 tablet 11  . rosuvastatin (CRESTOR) 10 MG tablet      No current facility-administered medications for this visit.     Review of Systems : See HPI for pertinent positives and negatives.  Physical Examination  Vitals:   06/13/17 1446 06/13/17 1451  BP: 138/82 132/71  Pulse: 81 83  Resp: 18   Temp: (!) 96.8 F (36 C)   TempSrc: Oral   SpO2: 97%   Weight: 163 lb (73.9 kg)   Height: 5' 8.5" (1.74 m)    Body mass index is 24.42 kg/m.  General: WDWN male in NAD GAIT:normal Eyes: PERRLA Pulmonary: Respirations are non-labored, CTAB Cardiac: Irregular rhythm, controlled rate no detected murmur.  VASCULAR EXAM Carotid Bruits Left Right   Positive Positive    Abdominal aortic pulse is not palpable. Radial pulses are 2+ palpable and equal.      LE Pulses LEFT RIGHT   POPLITEAL not palpable  not palpable   POSTERIOR TIBIAL  2+ palpable   2+ palpable    DORSALIS PEDIS  ANTERIOR TIBIAL 2+ palpable  2+ palpable     Gastrointestinal: soft, nontender, BS WNL, no r/g,no palpable masses. Musculoskeletal: No muscle  atrophy/wasting. M/S 5/5 throughout, Extremities without ischemic changes. No peripjeral edema.  Skin: No rashes, no ulcers, no cellulitis.   Neurologic:  A&O X 3; appropriate affect, sensation is normal; speech is normal, CN 2-12 intact, pain and light touch intact in extremities, motor exam as listed above. Psychiatric: Normal thought content, mood appropriate to clinical situation.    Assessment: Benjamin Patel is a 84 y.o. male who has no history of stroke or TIA. He has no steal symptoms in his upper extremities. He denies claudication symptoms in his legs with walking.  His cardiac rhythm is  irregular now, but he is asymptomatic of this; is not more dyspneic than usual, denies feeling light headed, denies chest pain or palpitations. He denies any known hx of irregular cardiac rhythm. He takes a daily 81 mg ASA, no anticoagulants. I advised him to see his PCP re this as soon as possible; he states he has not seen Dr. Marlou Porch in a few years.    DATA Carotid Duplex (06/13/17): Bilateral ICA with 1-39% stenosis. Distal right CCA stenosis >50% Bilateral ECA stenosis is present.  Right vertebral artery is not visualized, left is antegrade. Normal bilateral subclavian arteries. No significant change compared to exams on 09-28-14, 06-03-15, and 06-12-16.   Plan: Follow-up in 2years with Carotid Duplex scan.    I discussed in depth with the patient the nature of atherosclerosis, and emphasized the importance of maximal medical management including strict control of blood pressure, blood glucose, and lipid levels, obtaining regular exercise, and continued cessation of smoking.  The patient is aware that without maximal medical management the underlying atherosclerotic disease process will progress, limiting the benefit of any interventions. The patient was given information about stroke prevention and what symptoms should prompt the patient to seek immediate medical care. Thank you for  allowing [place] to participate in this patient's care.  Clemon Chambers, RN, MSN, FNP-C [facility] Office: [phone]  Clinic Physician: Oneida Alar  06/13/17 2:54 PM

## 2017-06-13 NOTE — Patient Instructions (Signed)

## 2017-06-18 ENCOUNTER — Encounter: Payer: Self-pay | Admitting: Cardiology

## 2017-06-18 ENCOUNTER — Ambulatory Visit (INDEPENDENT_AMBULATORY_CARE_PROVIDER_SITE_OTHER): Payer: Medicare Other | Admitting: Cardiology

## 2017-06-18 VITALS — BP 142/78 | HR 86 | Ht 68.5 in | Wt 165.0 lb

## 2017-06-18 DIAGNOSIS — I6523 Occlusion and stenosis of bilateral carotid arteries: Secondary | ICD-10-CM | POA: Diagnosis not present

## 2017-06-18 DIAGNOSIS — I480 Paroxysmal atrial fibrillation: Secondary | ICD-10-CM

## 2017-06-18 DIAGNOSIS — R0989 Other specified symptoms and signs involving the circulatory and respiratory systems: Secondary | ICD-10-CM | POA: Diagnosis not present

## 2017-06-18 DIAGNOSIS — Z79899 Other long term (current) drug therapy: Secondary | ICD-10-CM | POA: Diagnosis not present

## 2017-06-18 DIAGNOSIS — N3000 Acute cystitis without hematuria: Secondary | ICD-10-CM | POA: Diagnosis not present

## 2017-06-18 DIAGNOSIS — R3914 Feeling of incomplete bladder emptying: Secondary | ICD-10-CM | POA: Diagnosis not present

## 2017-06-18 MED ORDER — APIXABAN 5 MG PO TABS: 5.0000 mg | ORAL_TABLET | Freq: Two times a day (BID) | ORAL | 11 refills | Status: DC

## 2017-06-18 NOTE — Progress Notes (Signed)
[facility]. [address], [address] [place], [place]  [address] Phone: [phone] Fax:  [phone]  Date:  06/18/2017   ID:  Benjamin Patel, DOB 1927-07-27, MRN [id number]  PCP:  Deland Pretty, MD   History of Present Illness: Benjamin Patel is a 84 y.o. male with comorbidities include hypertension, peripheral vascular disease (Dr. Donnetta Hutching) including both carotid artery disease as well as renal artery stenosis (moderate), hyperlipidemia here for evaluation of irregular heartbeat. He had low risk nuclear stress test on 05/16/12 prior to Dr. Roxan Hockey lung mass removal surgery.    He quit tobacco abuse and 1978, newly diagnosed COPD. In January of this year developed a persistent cough. Nonproductive at first but then became productive of yellow sputum. Chest x-ray showed a possible left lung mass. CT of the chest was done and confirmed mass in the left lower lobe. Initially treated with antibiotics and the nodule decreased in size. However, he had a followup CT which showed mass has increased in size and a PET scan was done and showed hypermetabolic activity. There was no evidence of regional or distant metastasis.  No chest pain. No hemoptysis,  His mother had heart disease and father had cancer.   While at vascular surgery Doppler appointment, irregular heartbeat was picked up.  Thankfully this was brought to my attention during this office visit.  He was diagnosed with atrial fibrillation.  He is asymptomatic with this, no chest pain, no fevers chills nausea vomiting shortness of breath bleeding.  He does have occasional easy bruising on his arms with aspirin.  His aspirin will be stopped.  Eliquis will be started.  Previous creatinine 1.1.  Previous hemoglobin 14.8.    Wt Readings from Last 3 Encounters:  06/18/17 165 lb (74.8 kg)  06/13/17 163 lb (73.9 kg)  02/11/17 172 lb 9.6 oz (78.3 kg)     Past Medical History:  Diagnosis Date  . Abnormal prostate exam   . Arthritis    Gout     . Asthma    as a child  . Carotid stenosis   . Carotid stenosis right side   Dr.Early does carotid ultrasounds yrly-last one Mar 14-report in epic  . Cholelithiasis   . COPD (chronic obstructive pulmonary disease) (HCC)    slight   . Enlarged prostate    slightly   . Essential hypertension, benign    takes Clonodine,Losartan,and Metoprolol and HCTZ  . Gout    no meds required  . History of bronchitis   . History of colon polyps   . History of shingles   . Lung cancer ([facility]) dx'd 2014   LLL resection; xrt  . Mixed hyperlipidemia    takes Pravastatin daily  . PAC (premature atrial contraction)   . Pneumonia 02/08/2012  . PVCs (premature ventricular contractions)   . Radiation 12/13/14-12/24/14   left upper lobe nodule 50 gray  . Renal artery stenosis (HCC)    right  . Renal insufficiency   . Skin cancer    Lung Ca- Left Lung  . Type II or unspecified type diabetes mellitus without mention of complication, not stated as uncontrolled    02/14/2016 diet controlled    Past Surgical History:  Procedure Laterality Date  . COLONOSCOPY    . NO PAST SURGERIES    . SEGMENTECOMY Left 06/16/2012   Procedure: left lower lobe superior SEGMENTECTOMY with node dissection;  Surgeon: [name], MD;  Location: [facility];  Service: Thoracic;  Laterality: Left;  left superior  . VIDEO ASSISTED THORACOSCOPY Left 06/16/2012   Procedure: VIDEO ASSISTED THORACOSCOPY;  Surgeon: [name], MD;  [facility];  Service: Thoracic;  Laterality: Left;    Current Outpatient Medications  Medication Sig Dispense Refill  . allopurinol (ZYLOPRIM) 100 MG tablet Take 100 mg by mouth daily.     [place] Kitchen amLODipine (NORVASC) 5 MG tablet Take 5 mg by mouth daily.     . Calcium Carbonate (CALCIUM 600 PO) Take 600 mg by mouth daily.    . cholecalciferol (VITAMIN D) 1000 units tablet Take 1,000 Units by mouth 2 (two) times daily.     . cloNIDine (CATAPRES) 0.2 MG tablet TAKE ONE TABLET BY MOUTH TWICE  DAILY 180 tablet 0  . FLAXSEED, LINSEED, PO Take 1 tablet by mouth daily.     [place] Kitchen gabapentin (NEURONTIN) 400 MG capsule Take 400 mg by mouth daily.    . metFORMIN (GLUCOPHAGE-XR) 750 MG 24 hr tablet Take 750 mg by mouth daily.   3  . metoprolol succinate (TOPROL-XL) 100 MG 24 hr tablet Take 1 tablet (100 mg total) by mouth daily. Take with or immediately following a meal. 30 tablet 11  . rosuvastatin (CRESTOR) 10 MG tablet Take 10 mg by mouth daily.     [place] Kitchen apixaban (ELIQUIS) 5 MG TABS tablet Take 1 tablet (5 mg total) by mouth 2 (two) times daily. 60 tablet 11   No current facility-administered medications for this visit.     Allergies:    Allergies  Allergen Reactions  . Ace Inhibitors Cough  . Codeine Other (See Comments)    GI Upset  . Indomethacin Other (See Comments)    Elevated BP  . Losartan Swelling    ANGIOEDEMA/     Social History:  The patient  reports that he quit smoking about 41 years ago. His smoking use included cigarettes. He has a 45.00 pack-year smoking history. He has never used smokeless tobacco. He reports that he does not drink alcohol or use drugs.   ROS:  Please see the history of present illness.  All other review of systems negative PHYSICAL EXAM: VS:  BP (!) 142/78   Pulse 86   Ht 5' 8.5" (1.74 m)   Wt 165 lb (74.8 kg)   BMI 24.72 kg/m  GEN: Well nourished, well developed, in no acute distress  HEENT: normal  Neck: no JVD,B  carotid bruits, or masses Cardiac: IRRR; no murmurs, rubs, or gallops, trace pedal edema  Respiratory:  clear to auscultation bilaterally, normal work of breathing GI: soft, nontender, nondistended, + BS MS: no deformity or atrophy  Skin: warm and dry, no rash Neuro:  Alert and Oriented x 3, Strength and sensation are intact Psych: euthymic mood, full affect   EKG: 06/18/2017-atrial fibrillation heart rate 86, rightward axis personally viewed-prior sinus bradycardia rate 55, rightward axis, borderline LVH   PAC  ASSESSMENT AND  PLAN:  1. Atrial fibrillation, paroxysmal- picked up at vascular surgery office in May 2019 a few days ago.  We will go ahead and place on anticoagulation, Eliquis 5 mg twice a day.  Watch for any signs of bleeding.  Overall he is under good rate control currently 86 bpm.  I think given his relatively asymptomatic nature, we will not pursue rhythm control strategy.  Continue with rate control strategy. 2. Coronary artery calcifications-continue with aggressive secondary prevention. Nuclear stress test was reassuring with low risk, no ischemia, normal EF. He has not had any  active anginal symptoms.  Continues to do well.  No angina. 3. Stage IA non-small cell carcinoma-Dr. Roxan Hockey. CT scan, 4.8 mm nodule left upper lobe.  Radiation oncology has been following.  Stable.  Remission.   4. Hyperlipidemia-continue current cholesterol regimen.  Continue with statin therapy.   5. Carotid artery disease-bruits appreciated on exam. Followed by Dr. Donnetta Hutching.  Stable.  We will have him come back and see Cecille Rubin in 1 to 2 months.  Consider repeating CBC and basic metabolic profile at that time given new start Eliquis.  Signed, Candee Furbish, MD [facility]  06/18/2017 2:50 PM

## 2017-06-18 NOTE — Patient Instructions (Signed)
Medication Instructions:  Please stop your Asprin. Start Eliquis 5 mg one tablet twice a day. Continue all other medications as listed.  Labwork: Please have blood work today (CBC,BMP)  Follow-Up: Follow up in 1 month with Truitt Merle, NP.  If you need a refill on your cardiac medications before your next appointment, please call your pharmacy.  Thank you for choosing [facility]!!

## 2017-06-19 LAB — CBC
Hematocrit: 43.7 % (ref 37.5–51.0)
Hemoglobin: 14.6 g/dL (ref 13.0–17.7)
MCH: 30.1 pg (ref 26.6–33.0)
MCHC: 33.4 g/dL (ref 31.5–35.7)
MCV: 90 fL (ref 79–97)
Platelets: 229 10*3/uL (ref 150–450)
RBC: 4.85 x10E6/uL (ref 4.14–5.80)
RDW: 14.5 % (ref 12.3–15.4)
WBC: 13.6 10*3/uL — ABNORMAL HIGH (ref 3.4–10.8)

## 2017-06-19 LAB — BASIC METABOLIC PANEL
BUN / CREAT RATIO: 20 (ref 10–24)
BUN: 18 mg/dL (ref 8–27)
CO2: 27 mmol/L (ref 20–29)
Calcium: 10.4 mg/dL — ABNORMAL HIGH (ref 8.6–10.2)
Chloride: 95 mmol/L — ABNORMAL LOW (ref 96–106)
Creatinine, Ser: 0.91 mg/dL (ref 0.76–1.27)
GFR, EST AFRICAN AMERICAN: 87 mL/min/{1.73_m2} (ref >59)
GFR, EST NON AFRICAN AMERICAN: 76 mL/min/{1.73_m2} (ref >59)
Glucose: 135 mg/dL — ABNORMAL HIGH (ref 65–99)
Potassium: 4.4 mmol/L (ref 3.5–5.2)
Sodium: 139 mmol/L (ref 134–144)

## 2017-07-15 DIAGNOSIS — N3000 Acute cystitis without hematuria: Secondary | ICD-10-CM | POA: Diagnosis not present

## 2017-07-15 DIAGNOSIS — R3914 Feeling of incomplete bladder emptying: Secondary | ICD-10-CM | POA: Diagnosis not present

## 2017-07-22 ENCOUNTER — Ambulatory Visit: Payer: Medicare Other

## 2017-07-22 DIAGNOSIS — M205X2 Other deformities of toe(s) (acquired), left foot: Secondary | ICD-10-CM

## 2017-07-22 DIAGNOSIS — B351 Tinea unguium: Secondary | ICD-10-CM

## 2017-07-22 DIAGNOSIS — E1142 Type 2 diabetes mellitus with diabetic polyneuropathy: Secondary | ICD-10-CM

## 2017-07-24 ENCOUNTER — Telehealth: Payer: Self-pay | Admitting: Cardiology

## 2017-07-24 NOTE — Telephone Encounter (Signed)
Pt calls today with concerns that his Eliquis is too expensive. I advised pt to call his pharmacy and get pricing for Xarelto and Pradaxa. Pt is not interested in Warfarin. Pt will call back with drug pricing and plans on discussing options with Dr Marlou Porch at this time. If medication is still out of his budget, I will forward his concerns to our refill dept to apply for financial assistance. Pt agrees to plan and will call back.

## 2017-07-24 NOTE — Telephone Encounter (Signed)
New Message:       Pt c/o medication issue:  1. Name of Medication: apixaban (ELIQUIS) 5 MG TABS tablet  2. How are you currently taking this medication (dosage and times per day)? Take 1 tablet (5 mg total) by mouth 2 (two) times daily.  3. Are you having a reaction (difficulty breathing--STAT)? No  4. What is your medication issue? Pt is calling and states this medication is too expensive for him. Pt states it will be $500.

## 2017-07-26 DIAGNOSIS — N3000 Acute cystitis without hematuria: Secondary | ICD-10-CM | POA: Diagnosis not present

## 2017-07-26 NOTE — Progress Notes (Signed)
Subjective:   Patient ID: Benjamin Patel, male   DOB: 84 y.o.   MRN: [id number]   HPI Patient presents with elongated nails 1-5 both feet that are thick and painful and hard for him to cut   ROS      Objective:  Physical Exam  Neurovascular status intact with thick yellow brittle nailbeds 1-5 both feet that are painful     Assessment:  Mycotic nail infection with pain 1-5 both feet. Pedal pulses are present. No lesions, open wounds or callused areas     Plan:  Debride painful nailbeds 1-5 both feet with no iatrogenic bleeding noted

## 2017-07-31 NOTE — Telephone Encounter (Signed)
Agree with plan Karren [place], MD  

## 2017-08-05 ENCOUNTER — Ambulatory Visit (INDEPENDENT_AMBULATORY_CARE_PROVIDER_SITE_OTHER): Payer: Medicare Other | Admitting: Nurse Practitioner

## 2017-08-05 ENCOUNTER — Encounter: Payer: Self-pay | Admitting: Nurse Practitioner

## 2017-08-05 ENCOUNTER — Telehealth: Payer: Self-pay | Admitting: Nurse Practitioner

## 2017-08-05 VITALS — BP 100/60 | HR 100 | Ht 68.5 in | Wt 158.0 lb

## 2017-08-05 DIAGNOSIS — I6523 Occlusion and stenosis of bilateral carotid arteries: Secondary | ICD-10-CM | POA: Diagnosis not present

## 2017-08-05 DIAGNOSIS — Z7901 Long term (current) use of anticoagulants: Secondary | ICD-10-CM

## 2017-08-05 DIAGNOSIS — I4819 Other persistent atrial fibrillation: Secondary | ICD-10-CM

## 2017-08-05 DIAGNOSIS — I481 Persistent atrial fibrillation: Secondary | ICD-10-CM | POA: Diagnosis not present

## 2017-08-05 DIAGNOSIS — Z79899 Other long term (current) drug therapy: Secondary | ICD-10-CM | POA: Diagnosis not present

## 2017-08-05 LAB — CBC
Hematocrit: 37.3 % — ABNORMAL LOW (ref 37.5–51.0)
Hemoglobin: 12.7 g/dL — ABNORMAL LOW (ref 13.0–17.7)
MCH: 29.5 pg (ref 26.6–33.0)
MCHC: 34 g/dL (ref 31.5–35.7)
MCV: 87 fL (ref 79–97)
Platelets: 275 10*3/uL (ref 150–450)
RBC: 4.3 x10E6/uL (ref 4.14–5.80)
RDW: 13.1 % (ref 12.3–15.4)
WBC: 9.7 10*3/uL (ref 3.4–10.8)

## 2017-08-05 LAB — BASIC METABOLIC PANEL
BUN/Creatinine Ratio: 18 (ref 10–24)
BUN: 23 mg/dL (ref 8–27)
CO2: 29 mmol/L (ref 20–29)
Calcium: 10.3 mg/dL — ABNORMAL HIGH (ref 8.6–10.2)
Chloride: 94 mmol/L — ABNORMAL LOW (ref 96–106)
Creatinine, Ser: 1.31 mg/dL — ABNORMAL HIGH (ref 0.76–1.27)
GFR calc Af Amer: 56 mL/min/{1.73_m2} — ABNORMAL LOW (ref >59)
GFR calc non Af Amer: 49 mL/min/{1.73_m2} — ABNORMAL LOW (ref >59)
Glucose: 170 mg/dL — ABNORMAL HIGH (ref 65–99)
Potassium: 4.6 mmol/L (ref 3.5–5.2)
Sodium: 139 mmol/L (ref 134–144)

## 2017-08-05 MED ORDER — DILTIAZEM HCL ER COATED BEADS 120 MG PO CP24: 120.0000 mg | ORAL_CAPSULE | Freq: Every day | ORAL | 3 refills | Status: DC

## 2017-08-05 NOTE — Progress Notes (Signed)
CARDIOLOGY OFFICE NOTE  Date:  08/05/2017    Benjamin Patel Date of Birth: 1928/01/09 Medical Record #[id number]  PCP:  Deland Pretty, MD  Cardiologist:  Marisa Cyphers   Chief Complaint  Patient presents with  . Atrial Fibrillation    One month check - seen for Dr. Marlou Porch    History of Present Illness: Benjamin Patel is a 84 y.o. male who presents today for a 6 week check. Seen for Dr. Marlou Porch.   He has a history of HTN, PVD (Dr. Donnetta Hutching) including both carotid artery disease as well as renal artery stenosis (moderate), COPD, prior tobacco abuse, and hyperlipidemia.   Sent here last month and seen by Dr. Marlou Porch for an irregular heart beat.   He had low risk nuclear stress test on 05/16/12 prior to Dr. Roxan Hockey lung mass removal surgery. While at his vascular visit - noted irregular heart beat - found to be in AF. Eliquis was started. He was basically asymptomatic.   Comes in today. Here with his wife - I used to see her as a patient with Dr. Doreatha Lew many years ago. He feels like he is doing well. No chest pain. No awareness of the AF. No palpitations. Tolerating the Eliquis. Has had several bouts of UTI - no active hematuria. No bleeding bruising noted.   Past Medical History:  Diagnosis Date  . Abnormal prostate exam   . Arthritis    Gout   . Asthma    as a child  . Carotid stenosis   . Carotid stenosis right side   Dr.Early does carotid ultrasounds yrly-last one Mar 14-report in epic  . Cholelithiasis   . COPD (chronic obstructive pulmonary disease) (HCC)    slight   . Enlarged prostate    slightly   . Essential hypertension, benign    takes Clonodine,Losartan,and Metoprolol and HCTZ  . Gout    no meds required  . History of bronchitis   . History of colon polyps   . History of shingles   . Lung cancer ([place]) dx'd 2014   LLL resection; xrt  . Mixed hyperlipidemia    takes Pravastatin daily  . PAC (premature atrial contraction)   . Pneumonia 02/08/2012    . PVCs (premature ventricular contractions)   . Radiation 12/13/14-12/24/14   left upper lobe nodule 50 gray  . Renal artery stenosis (HCC)    right  . Renal insufficiency   . Skin cancer    Lung Ca- Left Lung  . Type II or unspecified type diabetes mellitus without mention of complication, not stated as uncontrolled    02/14/2016 diet controlled    Past Surgical History:  Procedure Laterality Date  . COLONOSCOPY    . NO PAST SURGERIES    . SEGMENTECOMY Left 06/16/2012   Procedure: left lower lobe superior SEGMENTECTOMY with node dissection;  Surgeon: [name], MD;  Location: [facility];  Service: Thoracic;  Laterality: Left;  left superior  . VIDEO ASSISTED THORACOSCOPY Left 06/16/2012   Procedure: VIDEO ASSISTED THORACOSCOPY;  Surgeon: [name], MD;  [facility];  Service: Thoracic;  Laterality: Left;     Medications: Current Meds  Medication Sig  . allopurinol (ZYLOPRIM) 100 MG tablet Take 100 mg by mouth daily.   [place] Kitchen apixaban (ELIQUIS) 5 MG TABS tablet Take 1 tablet (5 mg total) by mouth 2 (two) times daily.  . Calcium Carbonate (CALCIUM 600 PO) Take 600 mg by mouth daily.  . cholecalciferol (  VITAMIN D) 1000 units tablet Take 1,000 Units by mouth 2 (two) times daily.   . cloNIDine (CATAPRES) 0.2 MG tablet TAKE ONE TABLET BY MOUTH TWICE DAILY  . FLAXSEED, LINSEED, PO Take 1 tablet by mouth daily.   [place] Kitchen gabapentin (NEURONTIN) 400 MG capsule Take 400 mg by mouth daily.  . metFORMIN (GLUCOPHAGE-XR) 750 MG 24 hr tablet Take 750 mg by mouth daily.   . metoprolol succinate (TOPROL-XL) 100 MG 24 hr tablet Take 1 tablet (100 mg total) by mouth daily. Take with or immediately following a meal.  . phenazopyridine (PYRIDIUM) 200 MG tablet Take 200 mg by mouth every 6 (six) hours as needed.  . rosuvastatin (CRESTOR) 10 MG tablet Take 10 mg by mouth daily.   . [DISCONTINUED] amLODipine (NORVASC) 5 MG tablet Take 5 mg by mouth daily.      Allergies: Allergies  Allergen  Reactions  . Ace Inhibitors Cough  . Codeine Other (See Comments)    GI Upset  . Indomethacin Other (See Comments)    Elevated BP  . Losartan Swelling    ANGIOEDEMA/     Social History: The patient  reports that he quit smoking about 41 years ago. His smoking use included cigarettes. He has a 45.00 pack-year smoking history. He has never used smokeless tobacco. He reports that he does not drink alcohol or use drugs.   Family History: The patient's family history includes Cancer in his father; Diabetes in his mother; Heart disease in his mother; Hypertension in his mother; Kidney disease in his mother; Varicose Veins in his mother.   Review of Systems: Please see the history of present illness.   Otherwise, the review of systems is positive for none.   All other systems are reviewed and negative.   Physical Exam: VS:  BP 100/60 (BP Location: Left Arm, Patient Position: Sitting, Cuff Size: Normal)   Pulse 100   Ht 5' 8.5" (1.74 m)   Wt 158 lb (71.7 kg)   SpO2 92% Comment: at rest  BMI 23.67 kg/m  .  BMI Body mass index is 23.67 kg/m.  Wt Readings from Last 3 Encounters:  08/05/17 158 lb (71.7 kg)  06/18/17 165 lb (74.8 kg)  06/13/17 163 lb (73.9 kg)    General: Pleasant. Elderly male. Looks younger than his stated age. He is alert and in no acute distress.   HEENT: Normal.  Neck: Supple, no JVD, carotid bruits, or masses noted.  Cardiac: Irregular irregular rhythm. His rate is about 110 by my count. No murmurs, rubs, or gallops. No edema.  Respiratory:  Lungs are clear to auscultation bilaterally with normal work of breathing.  GI: Soft and nontender.  MS: No deformity or atrophy. Gait and ROM intact.  Skin: Warm and dry. Color is normal.  Neuro:  Strength and sensation are intact and no gross focal deficits noted.  Psych: Alert, appropriate and with normal affect.   LABORATORY DATA:  EKG:  EKG is not ordered today.  Lab Results  Component Value Date   WBC 13.6 (H)  06/18/2017   HGB 14.6 06/18/2017   HCT 43.7 06/18/2017   PLT 229 06/18/2017   GLUCOSE 135 (H) 06/18/2017   CHOL 186 08/17/2014   TRIG 234 (H) 08/17/2014   HDL 40 08/17/2014   LDLDIRECT 100.5 12/22/2012   LDLCALC 99 08/17/2014   ALT 15 (L) 11/16/2014   AST 22 11/16/2014   NA 139 06/18/2017   K 4.4 06/18/2017   CL 95 (L) 06/18/2017  CREATININE 0.91 06/18/2017   BUN 18 06/18/2017   CO2 27 06/18/2017   TSH 0.288 (L) 06/21/2012   PSA 1.80 01/28/2014   INR 1.00 06/12/2012   HGBA1C 6.7 04/21/2015   MICROALBUR 1.4 08/17/2014     BNP (last 3 results) No results for input(s): BNP in the last 8760 hours.  ProBNP (last 3 results) No results for input(s): PROBNP in the last 8760 hours.   Other Studies Reviewed Today:   Assessment/Plan:  1. AF - elected to manage with rate control and anticoagulation. Needs follow up lab today. Rate is not ideal. Will stop Norvasc. Change to Diltiazem 120 mg a day. See back in about a month to recheck. He is totally asymptomatic.   2. Coronary calcification - no active symptoms.   3. Stage 1A non small cell lung cancer - followed by oncology.   4. HLD - on statin therapy  5. HTN - BP little soft - he is not symptomatic.   6. Advanced age.   Current medicines are reviewed with the patient today.  The patient does not have concerns regarding medicines other than what has been noted above.  The following changes have been made:  See above.  Labs/ tests ordered today include:    Orders Placed This Encounter  Procedures  . Basic metabolic panel  . CBC     Disposition:   FU with me in 1 months.   Patient is agreeable to this plan and will call if any problems develop in the interim.   SignedTruitt Merle, NP  08/05/2017 10:02 AM  [facility] [address] [place], [place]  [address] Phone: [phone] Fax: [phone]

## 2017-08-05 NOTE — Telephone Encounter (Signed)
Pt's medication has already been sent to pt's pharmacy as requested. Confirmation received.  

## 2017-08-05 NOTE — Telephone Encounter (Signed)
New Message    *STAT* If patient is at the pharmacy, call can be transferred to refill team.   1. Which medications need to be refilled? (please list name of each medication and dose if known) diltiazem (CARDIZEM CD) 120 MG 24 hr capsule  2. Which pharmacy/location (including street and city if local pharmacy) is medication to be sent to? [place], [place]  3. Do they need a 30 day or 90 day supply? [place]

## 2017-08-05 NOTE — Patient Instructions (Addendum)
We will be checking the following labs today - BMET & CBC   Medication Instructions:    Continue with your current medicines. BUT  I am stopping the Norvasc (Amlodipine)  I am starting you on Diltiazem 120 mg to take once a day - this is at your pharmacy - this is to slow your heart rate down.     Testing/Procedures To Be Arranged:  N/A  Follow-Up:   See me in about 4 weeks with EKG    Other Special Instructions:   N/A    If you need a refill on your cardiac medications before your next appointment, please call your pharmacy.   Call the [place] office at [phone] if you have any questions, problems or concerns.

## 2017-08-06 NOTE — Telephone Encounter (Signed)
Followed up with patient who spoke with his insurance.  He had a $500 deductible he had to meet before they would start to pick up an help with the cost of Eliquis.  He reports it has all been straightened out now and he is able to afford it at this point.

## 2017-08-07 DIAGNOSIS — N3000 Acute cystitis without hematuria: Secondary | ICD-10-CM | POA: Diagnosis not present

## 2017-08-07 DIAGNOSIS — R3914 Feeling of incomplete bladder emptying: Secondary | ICD-10-CM | POA: Diagnosis not present

## 2017-08-09 ENCOUNTER — Telehealth: Payer: Self-pay | Admitting: *Deleted

## 2017-08-09 DIAGNOSIS — N411 Chronic prostatitis: Secondary | ICD-10-CM | POA: Diagnosis not present

## 2017-08-09 DIAGNOSIS — K5900 Constipation, unspecified: Secondary | ICD-10-CM | POA: Diagnosis not present

## 2017-08-09 DIAGNOSIS — R194 Change in bowel habit: Secondary | ICD-10-CM | POA: Diagnosis not present

## 2017-08-09 NOTE — Telephone Encounter (Signed)
CALLED PATIENT TO INFORM THAT FU HAS BEEN MOVED TO 08-15-17 DUE TO THE FACT THAT HE NEEDS CT , CT SCHEDULED FOR 08-14-17 @ [facility], NO RESTRICTIONS TO TEST, LVM FOR A RETURN CALL

## 2017-08-12 ENCOUNTER — Ambulatory Visit: Admission: RE | Admit: 2017-08-12 | Payer: Medicare Other | Source: Ambulatory Visit | Admitting: Radiation Oncology

## 2017-08-13 DIAGNOSIS — E1121 Type 2 diabetes mellitus with diabetic nephropathy: Secondary | ICD-10-CM | POA: Diagnosis not present

## 2017-08-13 DIAGNOSIS — I10 Essential (primary) hypertension: Secondary | ICD-10-CM | POA: Diagnosis not present

## 2017-08-14 ENCOUNTER — Ambulatory Visit (HOSPITAL_COMMUNITY)
Admission: RE | Admit: 2017-08-14 | Discharge: 2017-08-14 | Disposition: A | Discharge status: Home / Self Care | Payer: Medicare Other | Source: Ambulatory Visit | Attending: Radiation Oncology | Admitting: Radiation Oncology

## 2017-08-14 ENCOUNTER — Encounter (HOSPITAL_COMMUNITY): Payer: Self-pay

## 2017-08-14 DIAGNOSIS — I700 Atherosclerosis of aorta: Secondary | ICD-10-CM | POA: Insufficient documentation

## 2017-08-14 DIAGNOSIS — C3492 Malignant neoplasm of unspecified part of left bronchus or lung: Secondary | ICD-10-CM | POA: Insufficient documentation

## 2017-08-14 DIAGNOSIS — J439 Emphysema, unspecified: Secondary | ICD-10-CM | POA: Insufficient documentation

## 2017-08-15 ENCOUNTER — Other Ambulatory Visit: Payer: Self-pay

## 2017-08-15 ENCOUNTER — Ambulatory Visit
Admission: RE | Admit: 2017-08-15 | Discharge: 2017-08-15 | Disposition: A | Discharge status: Home / Self Care | Payer: Medicare Other | Source: Ambulatory Visit | Attending: Radiation Oncology | Admitting: Radiation Oncology

## 2017-08-15 ENCOUNTER — Encounter: Payer: Self-pay | Admitting: Radiation Oncology

## 2017-08-15 VITALS — BP 110/56 | HR 84 | Temp 97.7°F | Resp 20 | Ht 68.5 in | Wt 156.8 lb

## 2017-08-15 DIAGNOSIS — Z7901 Long term (current) use of anticoagulants: Secondary | ICD-10-CM | POA: Insufficient documentation

## 2017-08-15 DIAGNOSIS — J439 Emphysema, unspecified: Secondary | ICD-10-CM | POA: Diagnosis not present

## 2017-08-15 DIAGNOSIS — Z885 Allergy status to narcotic agent status: Secondary | ICD-10-CM | POA: Diagnosis not present

## 2017-08-15 DIAGNOSIS — Z85118 Personal history of other malignant neoplasm of bronchus and lung: Secondary | ICD-10-CM | POA: Diagnosis not present

## 2017-08-15 DIAGNOSIS — Z08 Encounter for follow-up examination after completed treatment for malignant neoplasm: Secondary | ICD-10-CM | POA: Diagnosis not present

## 2017-08-15 DIAGNOSIS — Z7984 Long term (current) use of oral hypoglycemic drugs: Secondary | ICD-10-CM | POA: Insufficient documentation

## 2017-08-15 DIAGNOSIS — Z79899 Other long term (current) drug therapy: Secondary | ICD-10-CM | POA: Diagnosis not present

## 2017-08-15 DIAGNOSIS — C3492 Malignant neoplasm of unspecified part of left bronchus or lung: Secondary | ICD-10-CM | POA: Diagnosis not present

## 2017-08-15 NOTE — Progress Notes (Signed)
Pt presents today for f/u with Dr. Sondra Come. Pt is taking Pyridium and Doxycycline for UTI. Pt reports fatigue as mild to moderate. Pt denies cough but wife states pt does have frequent cough. Pt coughed in presence of this RN. Weak cough, no production, rattling. Pt denies c/o pain. Pt denies swallowing difficulty.   BP (!) 110/56 (BP Location: Left Arm, Patient Position: Sitting, Cuff Size: Normal)   Pulse 84   Temp 97.7 F (36.5 C) (Oral)   Resp 20   Ht 5' 8.5" (1.74 m)   Wt 156 lb 12.8 oz (71.1 kg)   SpO2 97%   BMI 23.49 kg/m   Wt Readings from Last 3 Encounters:  08/15/17 156 lb 12.8 oz (71.1 kg)  08/05/17 158 lb (71.7 kg)  06/18/17 165 lb (74.8 kg)   Loma Sousa, RN BSN

## 2017-08-15 NOTE — Progress Notes (Signed)
Radiation Oncology         [phone]) [phone] ________________________________  Name: Benjamin Patel MRN: [id number]  Date: 08/15/2017  DOB: [birth year]  Follow-Up Visit Note  CC: Deland Pretty, MD  [name], *  Diagnosis: Stage I Solitary PET Positive Nodule in the Left Upper Lung   Interval Since Last Radiation: 2 years, 8 months, 12/13/14 - 12/24/14 SBRT/ Lung, Left/ 50 gray in 10 fractions   Narrative:  The patient returns today for routine follow-up. He is accompanied by his wife today. He reports having persistent UTIs since around 02/15/17, and is receiving treatments through his PCP. Since his last visit to the office, he underwent a follow-up CT chest without contrast on 08/14/17 showing no new or progressive interval findings.  On review of systems, he denies chest pain, hemoptysis. He reports frequent cough. Patient's breathing is stable. He reports no worsening of his emphysema.  ALLERGIES:  is allergic to ace inhibitors; codeine; indomethacin; and losartan.  Meds: Current Outpatient Medications  Medication Sig Dispense Refill  . allopurinol (ZYLOPRIM) 100 MG tablet Take 100 mg by mouth daily.     Marland Kitchen apixaban (ELIQUIS) 5 MG TABS tablet Take 1 tablet (5 mg total) by mouth 2 (two) times daily. 60 tablet 11  . Calcium Carbonate (CALCIUM 600 PO) Take 600 mg by mouth daily.    . cholecalciferol (VITAMIN D) 1000 units tablet Take 1,000 Units by mouth 2 (two) times daily.     . cloNIDine (CATAPRES) 0.2 MG tablet TAKE ONE TABLET BY MOUTH TWICE DAILY 180 tablet 0  . diltiazem (CARDIZEM CD) 120 MG 24 hr capsule Take 1 capsule (120 mg total) by mouth daily. 90 capsule 3  . FLAXSEED, LINSEED, PO Take 1 tablet by mouth daily.     Marland Kitchen gabapentin (NEURONTIN) 400 MG capsule Take 400 mg by mouth daily.    . metFORMIN (GLUCOPHAGE-XR) 750 MG 24 hr tablet Take 750 mg by mouth daily.   3  . metoprolol succinate (TOPROL-XL) 100 MG 24 hr tablet Take 1 tablet (100 mg total) by mouth daily. Take  with or immediately following a meal. 30 tablet 11  . phenazopyridine (PYRIDIUM) 200 MG tablet Take 200 mg by mouth every 6 (six) hours as needed.  1  . rosuvastatin (CRESTOR) 10 MG tablet Take 10 mg by mouth daily.     Marland Kitchen doxycycline (VIBRA-TABS) 100 MG tablet Take 100 mg by mouth 2 (two) times daily.  0   No current facility-administered medications for this encounter.     Physical Findings:  height is 5' 8.5" (1.74 m) and weight is 156 lb 12.8 oz (71.1 kg). His oral temperature is 97.7 F (36.5 C). His blood pressure is 110/56 (abnormal) and his pulse is 84. His respiration is 20 and oxygen saturation is 97%.   The patient is in no acute distress. Patient is alert and oriented. The heart has a regular rhythm and rate. The lungs are clear to auscultation bilaterally. No palpable supraclavicular, cervical, or axillary adenopathy. Abdomen is soft and non-tender.   Lab Findings: Lab Results  Component Value Date   WBC 9.7 08/05/2017   HGB 12.7 (L) 08/05/2017   HCT 37.3 (L) 08/05/2017   MCV 87 08/05/2017   PLT 275 08/05/2017    Radiographic Findings: Ct Chest Wo Contrast  Result Date: 08/14/2017 CLINICAL DATA:  Non-small-cell lung cancer on the left. EXAM: CT CHEST WITHOUT CONTRAST TECHNIQUE: Multidetector CT imaging of the chest was performed following the standard protocol without  IV contrast. COMPARISON:  02/08/2017 FINDINGS: Cardiovascular: Heart size upper normal to mildly increased. Coronary artery calcification is evident. Atherosclerotic calcification is noted in the wall of the thoracic aorta. Mediastinum/Nodes: No mediastinal lymphadenopathy. No evidence for gross hilar lymphadenopathy although assessment is limited by the lack of intravenous contrast on today's study. There is no axillary lymphadenopathy. The esophagus has normal imaging features. Lungs/Pleura: Emphysema. Stable appearance left apical scarring. 2 mm peripheral right lower lobe nodule measured previously is stable  (5:109). No focal airspace consolidation. No pulmonary edema or pleural effusion. Upper Abdomen: Nodularity of the anterior liver capsule raises the question of cirrhosis. Musculoskeletal: No worrisome lytic or sclerotic osseous abnormality. IMPRESSION: 1. Stable exam.  No new or progressive interval findings. 2. Emphysema 3.  Aortic Atherosclerois (ICD10-170.0) Electronically Signed   By: Misty Stanley M.D.   On: 08/14/2017 15:46    Impression: Non-Small-Cell Left Lung Cancer The patient is doing well at this time without any appreciable side effects from his treatment. No signs of recurrence on clinical exam today. Recent chest CT scan shows no findings to suggest local recurrence of disease or  metastatic disease in the thorax.  Plan:  He will have a chest CT in 6 months with a routine follow up after.   ____________________________________  Blair Promise, PhD, MD  This document serves as a record of services personally performed by Gery Pray, MD. It was created on his behalf by Wilburn Mylar, a trained medical scribe. The creation of this record is based on the scribe's personal observations and the provider's statements to them. This document has been checked and approved by the attending provider.

## 2017-09-03 DIAGNOSIS — R197 Diarrhea, unspecified: Secondary | ICD-10-CM | POA: Diagnosis not present

## 2017-09-03 DIAGNOSIS — N390 Urinary tract infection, site not specified: Secondary | ICD-10-CM | POA: Diagnosis not present

## 2017-09-03 DIAGNOSIS — N3001 Acute cystitis with hematuria: Secondary | ICD-10-CM | POA: Diagnosis not present

## 2017-09-03 DIAGNOSIS — R350 Frequency of micturition: Secondary | ICD-10-CM | POA: Diagnosis not present

## 2017-09-03 DIAGNOSIS — R109 Unspecified abdominal pain: Secondary | ICD-10-CM | POA: Diagnosis not present

## 2017-09-04 ENCOUNTER — Emergency Department (HOSPITAL_COMMUNITY)
Admission: EM | Admit: 2017-09-04 | Discharge: 2017-09-04 | Disposition: A | Discharge status: Home / Self Care | Payer: Medicare Other | Attending: Emergency Medicine | Admitting: Emergency Medicine

## 2017-09-04 ENCOUNTER — Ambulatory Visit: Payer: Medicare Other | Admitting: Nurse Practitioner

## 2017-09-04 ENCOUNTER — Other Ambulatory Visit: Payer: Self-pay

## 2017-09-04 ENCOUNTER — Emergency Department (HOSPITAL_COMMUNITY): Payer: Medicare Other

## 2017-09-04 ENCOUNTER — Encounter (HOSPITAL_COMMUNITY): Payer: Self-pay

## 2017-09-04 DIAGNOSIS — I4891 Unspecified atrial fibrillation: Secondary | ICD-10-CM | POA: Diagnosis not present

## 2017-09-04 DIAGNOSIS — Z87891 Personal history of nicotine dependence: Secondary | ICD-10-CM | POA: Diagnosis not present

## 2017-09-04 DIAGNOSIS — J449 Chronic obstructive pulmonary disease, unspecified: Secondary | ICD-10-CM | POA: Diagnosis not present

## 2017-09-04 DIAGNOSIS — I959 Hypotension, unspecified: Secondary | ICD-10-CM | POA: Diagnosis not present

## 2017-09-04 DIAGNOSIS — R4020 Unspecified coma: Secondary | ICD-10-CM | POA: Diagnosis not present

## 2017-09-04 DIAGNOSIS — Y92002 Bathroom of unspecified non-institutional (private) residence single-family (private) house as the place of occurrence of the external cause: Secondary | ICD-10-CM | POA: Insufficient documentation

## 2017-09-04 DIAGNOSIS — Z7984 Long term (current) use of oral hypoglycemic drugs: Secondary | ICD-10-CM | POA: Diagnosis not present

## 2017-09-04 DIAGNOSIS — W19XXXA Unspecified fall, initial encounter: Secondary | ICD-10-CM | POA: Diagnosis not present

## 2017-09-04 DIAGNOSIS — E1122 Type 2 diabetes mellitus with diabetic chronic kidney disease: Secondary | ICD-10-CM | POA: Diagnosis not present

## 2017-09-04 DIAGNOSIS — Z7901 Long term (current) use of anticoagulants: Secondary | ICD-10-CM | POA: Diagnosis not present

## 2017-09-04 DIAGNOSIS — N3001 Acute cystitis with hematuria: Secondary | ICD-10-CM

## 2017-09-04 DIAGNOSIS — R58 Hemorrhage, not elsewhere classified: Secondary | ICD-10-CM | POA: Diagnosis not present

## 2017-09-04 DIAGNOSIS — Z79899 Other long term (current) drug therapy: Secondary | ICD-10-CM | POA: Insufficient documentation

## 2017-09-04 DIAGNOSIS — I129 Hypertensive chronic kidney disease with stage 1 through stage 4 chronic kidney disease, or unspecified chronic kidney disease: Secondary | ICD-10-CM | POA: Diagnosis not present

## 2017-09-04 DIAGNOSIS — R197 Diarrhea, unspecified: Secondary | ICD-10-CM | POA: Diagnosis not present

## 2017-09-04 DIAGNOSIS — S0181XA Laceration without foreign body of other part of head, initial encounter: Secondary | ICD-10-CM | POA: Diagnosis not present

## 2017-09-04 DIAGNOSIS — Y999 Unspecified external cause status: Secondary | ICD-10-CM | POA: Insufficient documentation

## 2017-09-04 DIAGNOSIS — N182 Chronic kidney disease, stage 2 (mild): Secondary | ICD-10-CM | POA: Diagnosis not present

## 2017-09-04 DIAGNOSIS — Y93E1 Activity, personal bathing and showering: Secondary | ICD-10-CM | POA: Diagnosis not present

## 2017-09-04 DIAGNOSIS — R109 Unspecified abdominal pain: Secondary | ICD-10-CM | POA: Diagnosis not present

## 2017-09-04 DIAGNOSIS — W1812XA Fall from or off toilet with subsequent striking against object, initial encounter: Secondary | ICD-10-CM | POA: Insufficient documentation

## 2017-09-04 DIAGNOSIS — S0990XA Unspecified injury of head, initial encounter: Secondary | ICD-10-CM | POA: Diagnosis not present

## 2017-09-04 LAB — CBC WITH DIFFERENTIAL/PLATELET
BASOS ABS: 0 10*3/uL (ref 0.0–0.1)
Basophils Relative: 0 %
EOS ABS: 0 10*3/uL (ref 0.0–0.7)
EOS PCT: 0 %
HCT: 31.4 % — ABNORMAL LOW (ref 39.0–52.0)
HEMOGLOBIN: 10.3 g/dL — AB (ref 13.0–17.0)
Lymphocytes Relative: 9 %
Lymphs Abs: 1.3 10*3/uL (ref 0.7–4.0)
MCH: 29.7 pg (ref 26.0–34.0)
MCHC: 32.8 g/dL (ref 30.0–36.0)
MCV: 90.5 fL (ref 78.0–100.0)
Monocytes Absolute: 1.1 10*3/uL — ABNORMAL HIGH (ref 0.1–1.0)
Monocytes Relative: 9 %
NEUTROS PCT: 82 %
Neutro Abs: 10.9 10*3/uL — ABNORMAL HIGH (ref 1.7–7.7)
PLATELETS: 227 10*3/uL (ref 150–400)
RBC: 3.47 MIL/uL — AB (ref 4.22–5.81)
RDW: 14.2 % (ref 11.5–15.5)
WBC: 13.3 10*3/uL — ABNORMAL HIGH (ref 4.0–10.5)

## 2017-09-04 LAB — BASIC METABOLIC PANEL
ANION GAP: 10 (ref 5–15)
BUN: 33 mg/dL — ABNORMAL HIGH (ref 8–23)
CO2: 27 mmol/L (ref 22–32)
Calcium: 9.3 mg/dL (ref 8.9–10.3)
Chloride: 98 mmol/L (ref 98–111)
Creatinine, Ser: 1.49 mg/dL — ABNORMAL HIGH (ref 0.61–1.24)
GFR, EST AFRICAN AMERICAN: 46 mL/min — AB (ref >60)
GFR, EST NON AFRICAN AMERICAN: 40 mL/min — AB (ref >60)
Glucose, Bld: 183 mg/dL — ABNORMAL HIGH (ref 70–99)
Potassium: 4.3 mmol/L (ref 3.5–5.1)
SODIUM: 135 mmol/L (ref 135–145)

## 2017-09-04 LAB — URINALYSIS, ROUTINE W REFLEX MICROSCOPIC
BILIRUBIN URINE: NEGATIVE
GLUCOSE, UA: NEGATIVE mg/dL
KETONES UR: 5 mg/dL — AB
NITRITE: NEGATIVE
PROTEIN: 100 mg/dL — AB
RBC / HPF: >50 RBC/hpf — ABNORMAL HIGH (ref 0–5)
Specific Gravity, Urine: 1.013 (ref 1.005–1.030)
WBC, UA: >50 WBC/hpf — ABNORMAL HIGH (ref 0–5)
pH: 6 (ref 5.0–8.0)

## 2017-09-04 MED ORDER — ACETAMINOPHEN 325 MG PO TABS
650.0000 mg | ORAL_TABLET | Freq: Once | ORAL | Status: AC
  Administered 2017-09-04: 650 mg via ORAL
  Filled 2017-09-04: qty 2

## 2017-09-04 MED ORDER — SODIUM CHLORIDE 0.9 % IV SOLN
1.0000 g | Freq: Once | INTRAVENOUS | Status: AC
  Administered 2017-09-04: 1 g via INTRAVENOUS
  Filled 2017-09-04: qty 10

## 2017-09-04 MED ORDER — LIDOCAINE-EPINEPHRINE (PF) 2 %-1:200000 IJ SOLN
10.0000 mL | Freq: Once | INTRAMUSCULAR | Status: AC
  Administered 2017-09-04: 10 mL via INTRADERMAL
  Filled 2017-09-04: qty 20

## 2017-09-04 NOTE — ED Triage Notes (Signed)
Per ems: pt coming from home c/o falling off the toliet and hitting his head on the tub which resulted in a facial laceration and indention. No LOC. Pt is on blood thinners. Pt currently being treated for UTI.

## 2017-09-04 NOTE — ED Provider Notes (Signed)
[facility] DEPT Provider Note  CSN: [id number] Arrival date & time: 09/04/17 0425  Chief Complaint(s) Fall and Facial Laceration  HPI Benjamin Patel is a 84 y.o. male with a history of atrial fibrillation on anticoagulation who is currently being treated for urinary tract infection, presents to the emergency department for mechanical fall resulting in head trauma.  Patient reports that he was going to the bathroom and lost his balance causing him to fall forward striking his head on the bathtub.  This resulting and forehead laceration.  He denies any loss of consciousness, headache, visual disturbance, neck pain, back pain focal deficits, chest or abdomen pain, extremity pain.  HPI  Past Medical History Past Medical History:  Diagnosis Date  . Abnormal prostate exam   . Arthritis    Gout   . Asthma    as a child  . Carotid stenosis   . Carotid stenosis right side   Dr.Early does carotid ultrasounds yrly-last one Mar 14-report in epic  . Cholelithiasis   . COPD (chronic obstructive pulmonary disease) (HCC)    slight   . Enlarged prostate    slightly   . Essential hypertension, benign    takes Clonodine,Losartan,and Metoprolol and HCTZ  . Gout    no meds required  . History of bronchitis   . History of colon polyps   . History of shingles   . Lung cancer ([place]) dx'd 2014   LLL resection; xrt  . Mixed hyperlipidemia    takes Pravastatin daily  . PAC (premature atrial contraction)   . Pneumonia 02/08/2012  . PVCs (premature ventricular contractions)   . Radiation 12/13/14-12/24/14   left upper lobe nodule 50 gray  . Renal artery stenosis (HCC)    right  . Renal insufficiency   . Skin cancer    Lung Ca- Left Lung  . Type II or unspecified type diabetes mellitus without mention of complication, not stated as uncontrolled    02/14/2016 diet controlled   Patient Active Problem List   Diagnosis Date Noted  . Asymptomatic hypertensive urgency  02/14/2016  . Solitary pulmonary nodule 01/02/2015  . Lung mass 02/02/2014  . Diabetes ([place]) 02/10/2013  . Lung cancer ([place]) 02/10/2013  . Carotid arterial disease ([place]) 02/10/2013  . Coronary artery calcification 12/22/2012  . Atrial fibrillation ([place]) 06/23/2012  . CKD (chronic kidney disease) stage 2, GFR 60-89 ml/min 06/19/2012  . COPD/ GOLD III 06/17/2012  . DM (diabetes mellitus) type II controlled with renal manifestation ([place]) 06/17/2012  . Occlusion and stenosis of carotid artery without mention of cerebral infarction 03/20/2012  . Non-small cell carcinoma of lung, stage 1 ([place]) 02/10/2012  . Essential hypertension, benign   . Mixed hyperlipidemia   . Gout   . DM (diabetes mellitus), type 2 with peripheral vascular complications ([place])   . Carotid stenosis   . Cholelithiasis   . Renal insufficiency   . Renal artery stenosis ([place])    Home Medication(s) Prior to Admission medications   Medication Sig Start Date End Date Taking? Authorizing Provider  allopurinol (ZYLOPRIM) 100 MG tablet Take 100 mg by mouth daily.  05/16/16  Yes [provider]  apixaban (ELIQUIS) 5 MG TABS tablet Take 1 tablet (5 mg total) by mouth 2 (two) times daily. 06/18/17  Yes Jerline Pain, MD  Calcium Carbonate (CALCIUM 600 PO) Take 600 mg by mouth daily.   Yes [provider]  cephALEXin (KEFLEX) 500 MG capsule Take 500 mg by mouth 3 (three)  times daily. 09/03/17  Yes [provider]  cholecalciferol (VITAMIN D) 1000 units tablet Take 1,000 Units by mouth 2 (two) times daily.    Yes [provider]  cloNIDine (CATAPRES) 0.2 MG tablet TAKE ONE TABLET BY MOUTH TWICE DAILY 08/25/15  Yes Daub, Loura Back, MD  diltiazem (CARDIZEM CD) 120 MG 24 hr capsule Take 1 capsule (120 mg total) by mouth daily. 08/05/17 11/03/17 Yes Burtis Junes, NP  FLAXSEED, LINSEED, PO Take 1 tablet by mouth daily.    Yes [provider]  gabapentin (NEURONTIN) 400 MG capsule Take 400 mg by  mouth daily.   Yes [provider]  metFORMIN (GLUCOPHAGE-XR) 750 MG 24 hr tablet Take 750 mg by mouth daily.  05/14/17  Yes [provider]  metoprolol succinate (TOPROL-XL) 100 MG 24 hr tablet Take 1 tablet (100 mg total) by mouth daily. Take with or immediately following a meal. 08/17/14  Yes Daub, Loura Back, MD  rosuvastatin (CRESTOR) 10 MG tablet Take 10 mg by mouth daily.  05/04/15  Yes [provider]  tamsulosin (FLOMAX) 0.4 MG CAPS capsule Take 0.4 mg by mouth daily. 08/10/17  Yes [provider]                                                                                                                                    Past Surgical History Past Surgical History:  Procedure Laterality Date  . COLONOSCOPY    . NO PAST SURGERIES    . SEGMENTECOMY Left 06/16/2012   Procedure: left lower lobe superior SEGMENTECTOMY with node dissection;  Surgeon: [name], MD;  Location: [facility];  Service: Thoracic;  Laterality: Left;  left superior  . VIDEO ASSISTED THORACOSCOPY Left 06/16/2012   Procedure: VIDEO ASSISTED THORACOSCOPY;  Surgeon: [name], MD;  Location: [facility] OR;  Service: Thoracic;  Laterality: Left;   Family History Family History  Problem Relation Age of Onset  . Heart disease Mother   . Diabetes Mother        AKA  Right   . Hypertension Mother   . Varicose Veins Mother   . Kidney disease Mother        One Kidney  . Cancer Father        Lung    Social History Social History   Tobacco Use  . Smoking status: Former Smoker    Packs/day: 1.50    Years: 30.00    Pack years: 45.00    Types: Cigarettes    Last attempt to quit: 01/16/1976    Years since quitting: 41.6  . Smokeless tobacco: Never Used  Substance Use Topics  . Alcohol use: No    Alcohol/week: 0.0 standard drinks  . Drug use: No   Allergies Ace inhibitors; Codeine; Indomethacin; and Losartan  Review of Systems Review of Systems All other systems are  reviewed and are negative for acute change except as  noted in the HPI  Physical Exam Vital Signs  I have reviewed the triage vital signs BP (!) 109/55 (BP Location: Right Arm)   Pulse (!) 114   Temp 99 F (37.2 C) (Oral)   Resp (!) 26   Ht 5' 8.5" (1.74 m)   Wt 71 kg   SpO2 93%   BMI 23.45 kg/m   Physical Exam  Constitutional: He is oriented to person, place, and time. He appears well-developed and well-nourished. No distress.  HENT:  Head: Normocephalic. Head is with laceration.    Right Ear: External ear normal.  Left Ear: External ear normal.  Mouth/Throat: Oropharynx is clear and moist.  Eyes: Pupils are equal, round, and reactive to light. Conjunctivae and EOM are normal. Right eye exhibits no discharge. Left eye exhibits no discharge. No scleral icterus.  Neck: Normal range of motion. Neck supple.  Cardiovascular: Regular rhythm and normal heart sounds. Exam reveals no gallop and no friction rub.  No murmur heard. Pulses:      Radial pulses are 2+ on the right side, and 2+ on the left side.       Dorsalis pedis pulses are 2+ on the right side, and 2+ on the left side.  Pulmonary/Chest: Effort normal and breath sounds normal. No stridor. No respiratory distress.  Abdominal: Soft. He exhibits no distension. There is no tenderness.  Musculoskeletal:       Cervical back: He exhibits no bony tenderness.       Thoracic back: He exhibits no bony tenderness.       Lumbar back: He exhibits no bony tenderness.  Clavicle stable. Chest stable to AP/Lat compression. Pelvis stable to Lat compression. No obvious extremity deformity. No chest or abdominal wall contusion.  Neurological: He is alert and oriented to person, place, and time. GCS eye subscore is 4. GCS verbal subscore is 5. GCS motor subscore is 6.  Moving all extremities   Skin: Skin is warm. He is not diaphoretic.    ED Results and Treatments Labs (all labs ordered are listed, but only abnormal results are  displayed) Labs Reviewed  CBC WITH DIFFERENTIAL/PLATELET - Abnormal; Notable for the following components:      Result Value   WBC 13.3 (*)    RBC 3.47 (*)    Hemoglobin 10.3 (*)    HCT 31.4 (*)    Neutro Abs 10.9 (*)    Monocytes Absolute 1.1 (*)    All other components within normal limits  BASIC METABOLIC PANEL - Abnormal; Notable for the following components:   Glucose, Bld 183 (*)    BUN 33 (*)    Creatinine, Ser 1.49 (*)    GFR calc non Af Amer 40 (*)    GFR calc Af Amer 46 (*)    All other components within normal limits  URINALYSIS, ROUTINE W REFLEX MICROSCOPIC - Abnormal; Notable for the following components:   APPearance TURBID (*)    Hgb urine dipstick SMALL (*)    Ketones, ur 5 (*)    Protein, ur 100 (*)    Leukocytes, UA MODERATE (*)    RBC / HPF >50 (*)    WBC, UA >50 (*)    Bacteria, UA MANY (*)    All other components within normal limits  URINE CULTURE  EKG  EKG Interpretation  Date/Time:  Wednesday September 04 2017 04:45:37 EDT Ventricular Rate:  110 PR Interval:    QRS Duration: 80 QT Interval:  302 QTC Calculation: 407 R Axis:   92 Text Interpretation:  Atrial fibrillation Right axis deviation Borderline repolarization abnormality Confirmed by Addison Lank [id number]) on 09/04/2017 5:30:40 AM      Radiology Ct Head Wo Contrast  Result Date: 09/04/2017 CLINICAL DATA:  [address] on way to bathroom, struck forehead. No loss of consciousness. History of lung cancer, hyperlipidemia, carotid stenosis, atrial fibrillation, hypertension. EXAM: CT HEAD WITHOUT CONTRAST TECHNIQUE: Contiguous axial images were obtained from the base of the skull through the vertex without intravenous contrast. COMPARISON:  None. FINDINGS: BRAIN: No intraparenchymal hemorrhage, mass effect nor midline shift. The ventricles and sulci are normal for age. Patchy  supratentorial white matter hypodensities within normal range for patient's age, though non-specific are most compatible with chronic small vessel ischemic disease. No acute large vascular territory infarcts. No abnormal extra-axial fluid collections. Basal cisterns are patent. VASCULAR: Moderate calcific atherosclerosis of the carotid siphons. SKULL: No skull fracture. Small RIGHT frontal scalp hematoma with subcutaneous gas and overlying bandage. No radiopaque foreign bodies. SINUSES/ORBITS: Trace paranasal sinus mucosal thickening. Mastoid air cells are well aerated.The included ocular globes and orbital contents are non-suspicious. OTHER: None. IMPRESSION: 1. Small RIGHT frontal scalp hematoma and laceration. 2. Otherwise negative non-contrast CT HEAD for age. Electronically Signed   By: Elon Alas M.D.   On: 09/04/2017 05:25   Pertinent labs & imaging results that were available during my care of the patient were reviewed by me and considered in my medical decision making (see chart for details).  Medications Ordered in ED Medications  cefTRIAXone (ROCEPHIN) 1 g in sodium chloride 0.9 % 100 mL IVPB (has no administration in time range)  acetaminophen (TYLENOL) tablet 650 mg (has no administration in time range)  lidocaine-EPINEPHrine (XYLOCAINE W/EPI) 2 %-1:200000 (PF) injection 10 mL (10 mLs Intradermal Given by Other 09/04/17 0503)                                                                                                                                    Procedures .Marland KitchenLaceration Repair Date/Time: 09/04/2017 7:27 AM Performed by: Fatima Blank, MD Authorized by: Fatima Blank, MD   Consent:    Consent obtained:  Verbal   Consent given by:  Patient   Risks discussed:  Poor wound healing, poor cosmetic result and need for additional repair Anesthesia (see MAR for exact dosages):    Anesthesia method:  Local infiltration   Local anesthetic:  Lidocaine 2% WITH  epi Laceration details:    Location:  Face   Face location:  Forehead   Length (cm):  2.5   Depth (mm):  4 Repair type:    Repair type:  Intermediate Pre-procedure details:    Preparation:  Patient was prepped and draped in usual sterile  fashion and imaging obtained to evaluate for foreign bodies Exploration:    Wound extent: no fascia violation noted, no foreign bodies/material noted, no muscle damage noted, no tendon damage noted and no vascular damage noted     Contaminated: no   Treatment:    Area cleansed with:  Betadine   Amount of cleaning:  Extensive   Irrigation solution:  Sterile saline   Irrigation volume:  500cc   Irrigation method:  Syringe   Visualized foreign bodies/material removed: no   Skin repair:    Repair method:  Sutures   Suture size:  5-0   Suture material:  Prolene   Suture technique:  Running locked   Number of sutures:  8 Approximation:    Approximation:  Close Post-procedure details:    Dressing:  Adhesive bandage   Patient tolerance of procedure:  Tolerated well, no immediate complications    (including critical care time)  Medical Decision Making / ED Course I have reviewed the nursing notes for this encounter and the patient's prior records (if available in EHR or on provided paperwork).    Mechanical fall resulting in head trauma and forehead laceration.  Patient is anticoagulated.  Wound still bleeding.  Pressure dressing applied.  Will obtain a CT scan of the head. Tetanus UTD.  CT head negative for ICH or fracture.  Laceration irrigated and closed as above.  UA obtained and grossly positive for urinary tract infection.  He is currently on Keflex for treatment.  Will give dose of Rocephin and have him follow-up cultures for speciation with his primary care provider.   Final Clinical Impression(s) / ED Diagnoses Final diagnoses:  Facial laceration, initial encounter  Fall, initial encounter  Acute cystitis with hematuria    Disposition: Discharge  Condition: Good  I have discussed the results, Dx and Tx plan with the patient and wife who expressed understanding and agree(s) with the plan. Discharge instructions discussed at great length. The patient and wife was given strict return precautions who verbalized understanding of the instructions. No further questions at time of discharge.    ED Discharge Orders    None       Follow Up: Deland Pretty, [place] [address] [phone]  Schedule an appointment as soon as possible for a visit  In 2 to 3 days to follow-up for urine culture results to ensure appropriate treatment of urinary tract infection  [facility] DEPT [place] [id number] [place] [phone]  in 5-7 days, For suture removal      This chart was dictated using voice recognition software.  Despite best efforts to proofread,  errors can occur which can change the documentation meaning.   Fatima Blank, MD 09/04/17 [phone]

## 2017-09-04 NOTE — ED Notes (Signed)
Bed: HT98 Expected date:  Expected time:  Means of arrival:  Comments: 84 yo fall, head injury

## 2017-09-04 NOTE — Discharge Instructions (Addendum)
Hold your Keflex for today as you were given similar medication through the IV.  You may restart your Keflex tomorrow morning.

## 2017-09-05 ENCOUNTER — Telehealth: Payer: Self-pay | Admitting: Nurse Practitioner

## 2017-09-05 LAB — URINE CULTURE

## 2017-09-05 NOTE — Telephone Encounter (Signed)
Patient missed his appointment with Truitt Merle, NP, due to a fall off of his toilet. He fell while sitting down and he hit is head on the bathtub. He went to the ER to get 8 stitches. The patient tested positive for a UTI in the ER. The patient had another fall last night while he was changing clothes at night. This fall did not result in an injury. The patient's wife had concerns about his medication being the causative factor. Advised patient to slowly change positions.   Sending to Truitt Merle for recommendations.

## 2017-09-05 NOTE — Telephone Encounter (Signed)
New Message:    Patient wife calling  Having some concerns about her husband medication. Patient husband had 8 stiches after he fell last night he went to the ER [employer].

## 2017-09-06 NOTE — Telephone Encounter (Signed)
I'm covering Lori's inbox as she is out of town. This patient's falls are very concerning. In reviewing his chart his BP was low in the ER and at the office visit in July. he is on multiple meds. At this point I would tell him to stop the clonidine. He should be brought into the office next week for orthostatics and EKG to make sure further adjustments aren't needed. Try to get him in with Cecille Rubin, Dr. Marlou Porch or myself. Thanks, Selinda Eon

## 2017-09-06 NOTE — Telephone Encounter (Signed)
Discussed plan with patient and his wife. Instructed him to STOP CLONIDINE. Scheduled patient for evaluation with Dr. Marlou Porch on Monday, 8/26. Patient was grateful for assistance agrees with treatment plan.

## 2017-09-09 ENCOUNTER — Encounter: Payer: Self-pay | Admitting: Cardiology

## 2017-09-09 ENCOUNTER — Ambulatory Visit (INDEPENDENT_AMBULATORY_CARE_PROVIDER_SITE_OTHER): Payer: Medicare Other | Admitting: Cardiology

## 2017-09-09 VITALS — BP 146/82 | HR 129 | Ht 68.5 in | Wt 153.6 lb

## 2017-09-09 DIAGNOSIS — I6523 Occlusion and stenosis of bilateral carotid arteries: Secondary | ICD-10-CM

## 2017-09-09 DIAGNOSIS — Z7901 Long term (current) use of anticoagulants: Secondary | ICD-10-CM | POA: Diagnosis not present

## 2017-09-09 DIAGNOSIS — I481 Persistent atrial fibrillation: Secondary | ICD-10-CM | POA: Diagnosis not present

## 2017-09-09 DIAGNOSIS — Z79899 Other long term (current) drug therapy: Secondary | ICD-10-CM

## 2017-09-09 DIAGNOSIS — I4819 Other persistent atrial fibrillation: Secondary | ICD-10-CM

## 2017-09-09 MED ORDER — DILTIAZEM HCL ER COATED BEADS 180 MG PO CP24: 180.0000 mg | ORAL_CAPSULE | Freq: Every day | ORAL | 3 refills | Status: AC

## 2017-09-09 NOTE — Patient Instructions (Signed)
Medication Instructions:  Please increase your Diltiazem to 180 mg a day. Continue all other medications as listed.  Follow-Up: Follow up in 1 month.  Thank you for choosing [facility]!!

## 2017-09-09 NOTE — Progress Notes (Signed)
[facility] [address], [place]  [address] Phone: [phone] Fax:  [phone]  Date:  09/09/2017   ID:  Benjamin Patel, DOB [birth year], MRN [id number]  PCP:  Deland Pretty, MD   History of Present Illness: Benjamin Patel is a 84 y.o. male with comorbidities include hypertension, peripheral vascular disease (Dr. Donnetta Hutching) including both carotid artery disease as well as renal artery stenosis (moderate), hyperlipidemia here for evaluation of irregular heartbeat. He had low risk nuclear stress test on 05/16/12 prior to Dr. Roxan Hockey lung mass removal surgery.    He quit tobacco abuse and 1978, newly diagnosed COPD. In January of this year developed a persistent cough. Nonproductive at first but then became productive of yellow sputum. Chest x-ray showed a possible left lung mass. CT of the chest was done and confirmed mass in the left lower lobe. Initially treated with antibiotics and the nodule decreased in size. However, he had a followup CT which showed mass has increased in size and a PET scan was done and showed hypermetabolic activity. There was no evidence of regional or distant metastasis.  No chest pain. No hemoptysis,  His mother had heart disease and father had cancer.   While at vascular surgery Doppler appointment, irregular heartbeat was picked up.  Thankfully this was brought to my attention during this office visit.  He was diagnosed with atrial fibrillation.  He is asymptomatic with this, no chest pain, no fevers chills nausea vomiting shortness of breath bleeding.  He does have occasional easy bruising on his arms with aspirin.  His aspirin will be stopped.  Eliquis will be started.  Previous creatinine 1.1.  Previous hemoglobin 14.8.  09/09/2017-   Wt Readings from Last 3 Encounters:  09/09/17 153 lb 9.6 oz (69.7 kg)  09/04/17 156 lb 8.4 oz (71 kg)  08/15/17 156 lb 12.8 oz (71.1 kg)     Past Medical History:  Diagnosis Date  . Abnormal prostate exam    . Arthritis    Gout   . Asthma    as a child  . Carotid stenosis   . Carotid stenosis right side   Dr.Early does carotid ultrasounds yrly-last one Mar 14-report in epic  . Cholelithiasis   . COPD (chronic obstructive pulmonary disease) (HCC)    slight   . Enlarged prostate    slightly   . Essential hypertension, benign    takes Clonodine,Losartan,and Metoprolol and HCTZ  . Gout    no meds required  . History of bronchitis   . History of colon polyps   . History of shingles   . Lung cancer ([place]) dx'd 2014   LLL resection; xrt  . Mixed hyperlipidemia    takes Pravastatin daily  . PAC (premature atrial contraction)   . Pneumonia 02/08/2012  . PVCs (premature ventricular contractions)   . Radiation 12/13/14-12/24/14   left upper lobe nodule 50 gray  . Renal artery stenosis (HCC)    right  . Renal insufficiency   . Skin cancer    Lung Ca- Left Lung  . Type II or unspecified type diabetes mellitus without mention of complication, not stated as uncontrolled    02/14/2016 diet controlled    Past Surgical History:  Procedure Laterality Date  . COLONOSCOPY    . NO PAST SURGERIES    . SEGMENTECOMY Left 06/16/2012   Procedure: left lower lobe superior SEGMENTECTOMY with node dissection;  Surgeon: [name], MD;  Location: [facility];  Service: Thoracic;  Laterality: Left;  left superior  . VIDEO ASSISTED THORACOSCOPY Left 06/16/2012   Procedure: VIDEO ASSISTED THORACOSCOPY;  Surgeon: [name], MD;  Location: [facility] OR;  Service: Thoracic;  Laterality: Left;    Current Outpatient Medications  Medication Sig Dispense Refill  . allopurinol (ZYLOPRIM) 100 MG tablet Take 100 mg by mouth daily.     Marland Kitchen apixaban (ELIQUIS) 5 MG TABS tablet Take 1 tablet (5 mg total) by mouth 2 (two) times daily. 60 tablet 11  . Calcium Carbonate (CALCIUM 600 PO) Take 600 mg by mouth daily.    . cephALEXin (KEFLEX) 500 MG capsule Take 500 mg by mouth 3 (three) times daily.    . cholecalciferol  (VITAMIN D) 1000 units tablet Take 1,000 Units by mouth 2 (two) times daily.     Marland Kitchen diltiazem (CARDIZEM CD) 180 MG 24 hr capsule Take 1 capsule (180 mg total) by mouth daily. 90 capsule 3  . FLAXSEED, LINSEED, PO Take 1 tablet by mouth daily.     Marland Kitchen gabapentin (NEURONTIN) 400 MG capsule Take 400 mg by mouth daily.    . metFORMIN (GLUCOPHAGE-XR) 750 MG 24 hr tablet Take 750 mg by mouth daily.   3  . metoprolol succinate (TOPROL-XL) 100 MG 24 hr tablet Take 1 tablet (100 mg total) by mouth daily. Take with or immediately following a meal. 30 tablet 11  . rosuvastatin (CRESTOR) 10 MG tablet Take 10 mg by mouth daily.     . tamsulosin (FLOMAX) 0.4 MG CAPS capsule Take 0.4 mg by mouth daily.  11   No current facility-administered medications for this visit.     Allergies:    Allergies  Allergen Reactions  . Ace Inhibitors Cough  . Codeine Other (See Comments)    GI Upset  . Indomethacin Other (See Comments)    Elevated BP  . Losartan Swelling    Angioedema    Social History:  The patient  reports that he quit smoking about 41 years ago. His smoking use included cigarettes. He has a 45.00 pack-year smoking history. He has never used smokeless tobacco. He reports that he does not drink alcohol or use drugs.   ROS:  Please see the history of present illness.  All other review of systems negative PHYSICAL EXAM: VS:  BP (!) 146/82   Pulse (!) 129   Ht 5' 8.5" (1.74 m)   Wt 153 lb 9.6 oz (69.7 kg)   BMI 23.02 kg/m  GEN: Well nourished, well developed, in no acute distress  HEENT: right forehead lesion.  Neck: no JVD, carotid bruits, or masses Cardiac: Irreg no murmurs, rubs, or gallops, ankle edema  Respiratory:  clear to auscultation bilaterally, normal work of breathing GI: soft, nontender, nondistended, + BS MS: no deformity or atrophy  Skin: warm and dry, no rash Neuro:  Alert and Oriented x 3, Strength and sensation are intact Psych: euthymic mood, full affect   EKG:  09/09/2017-atrial fibrillation 129 bpm-prior 06/18/2017-atrial fibrillation heart rate 86, rightward axis personally viewed-prior sinus bradycardia rate 55, rightward axis, borderline LVH   PAC  ASSESSMENT AND PLAN:  1. Atrial fibrillation, persistent- picked up originally early at vascular surgery office in May 2019. Eliquis 5 mg twice a day.  Heart rate used to be in the 80s.  Currently 129.  He did state that he had not taken his diltiazem.  Regardless, I would like to increase his diltiazem from 1 20-1 80.  Continue with metoprolol.  No evidence  of orthostatic hypotension. 2. Facial laceration-head CT was negative for bleed.  Went to emergency room.  He was leaning over at night trying to go to the bathroom and he fell forward.  Mechanical fall off. 3. Coronary artery calcifications-continue with aggressive secondary prevention. Nuclear stress test was reassuring with low risk, no ischemia, normal EF. He has not had any active anginal symptoms.  Continues to do well.  No angina.  No changes. 4. Stage IA non-small cell carcinoma-Dr. Roxan Hockey. CT scan, 4.8 mm nodule left upper lobe.  Radiation oncology has been following.  Stable.  Remission.  Doing well. 5. Hyperlipidemia-continue current cholesterol regimen.  Continue with statin therapy.  Overall stable. 6. Carotid artery disease-bruits appreciated on exam. Followed by Dr. Donnetta Hutching.  Stable.  No changes.  He will come back and see me in 1 month.  Signed, Candee Furbish, MD [facility]  09/09/2017 12:35 PM

## 2017-09-17 DIAGNOSIS — Z23 Encounter for immunization: Secondary | ICD-10-CM | POA: Diagnosis not present

## 2017-09-17 DIAGNOSIS — R350 Frequency of micturition: Secondary | ICD-10-CM | POA: Diagnosis not present

## 2017-09-17 DIAGNOSIS — R609 Edema, unspecified: Secondary | ICD-10-CM | POA: Diagnosis not present

## 2017-09-17 DIAGNOSIS — R197 Diarrhea, unspecified: Secondary | ICD-10-CM | POA: Diagnosis not present

## 2017-09-18 DIAGNOSIS — K5900 Constipation, unspecified: Secondary | ICD-10-CM | POA: Diagnosis not present

## 2017-09-18 DIAGNOSIS — R634 Abnormal weight loss: Secondary | ICD-10-CM | POA: Diagnosis not present

## 2017-09-18 DIAGNOSIS — R197 Diarrhea, unspecified: Secondary | ICD-10-CM | POA: Diagnosis not present

## 2017-10-10 DIAGNOSIS — R350 Frequency of micturition: Secondary | ICD-10-CM | POA: Diagnosis not present

## 2017-10-10 DIAGNOSIS — N3000 Acute cystitis without hematuria: Secondary | ICD-10-CM | POA: Diagnosis not present

## 2017-10-16 ENCOUNTER — Encounter: Payer: Self-pay | Admitting: Cardiology

## 2017-10-16 ENCOUNTER — Ambulatory Visit (INDEPENDENT_AMBULATORY_CARE_PROVIDER_SITE_OTHER): Payer: Medicare Other | Admitting: Cardiology

## 2017-10-16 VITALS — BP 102/52 | HR 70 | Ht 68.5 in | Wt 148.0 lb

## 2017-10-16 DIAGNOSIS — I6523 Occlusion and stenosis of bilateral carotid arteries: Secondary | ICD-10-CM | POA: Diagnosis not present

## 2017-10-16 DIAGNOSIS — Z7901 Long term (current) use of anticoagulants: Secondary | ICD-10-CM

## 2017-10-16 DIAGNOSIS — I4819 Other persistent atrial fibrillation: Secondary | ICD-10-CM

## 2017-10-16 DIAGNOSIS — Z79899 Other long term (current) drug therapy: Secondary | ICD-10-CM | POA: Diagnosis not present

## 2017-10-16 NOTE — Progress Notes (Signed)
Cardiology Office Note:    Date:  10/16/2017   ID:  Benjamin Patel, DOB August 01, 1927, MRN [id number]  PCP:  Deland Pretty, MD  Cardiologist:  No primary care provider on file.  Electrophysiologist:  None   Referring MD: Deland Pretty, MD     History of Present Illness:    Benjamin Patel is a 84 y.o. male here for follow-up of atrial fibrillation, PVD.  Atrial fibrillation was originally picked up at the vascular surgery office in May 2019 and he was started on Eliquis 5 mg twice a day.  Heart rate last clinic visit was 129 on 09/09/2017.  He did not take the diltiazem at that time we increased his diltiazem to 180 mg and continued his metoprolol.  He was not having any evidence of orthostatic hypotension.  He has had stage I non-small cell carcinoma removed by Dr. Roxan Hockey, 2014 radiation.  He also has hyperlipidemia, carotid artery disease followed by vascular.  Has coronary artery calcifications as well.  Nuclear stress test low risk.  Quit tobacco use in 1978, diagnosed with COPD.  Denies any nausea vomiting fevers chills orthopnea PND syncope. Urinary and diarrhea symptoms. Wt loss.  His last CT scan in July 2019 was reassuring and stable.  He has been back on metformin which sometimes can irritate the stomach.  He will discuss with Dr. Shelia Media     Past Medical History:  Diagnosis Date  . Abnormal prostate exam   . Arthritis    Gout   . Asthma    as a child  . Carotid stenosis   . Carotid stenosis right side   Dr.Early does carotid ultrasounds yrly-last one Mar 14-report in epic  . Cholelithiasis   . COPD (chronic obstructive pulmonary disease) (HCC)    slight   . Enlarged prostate    slightly   . Essential hypertension, benign    takes Clonodine,Losartan,and Metoprolol and HCTZ  . Gout    no meds required  . History of bronchitis   . History of colon polyps   . History of shingles   . Lung cancer ([place]) dx'd 2014   LLL resection; xrt  . Mixed hyperlipidemia    takes Pravastatin daily  . PAC (premature atrial contraction)   . Pneumonia 02/08/2012  . PVCs (premature ventricular contractions)   . Radiation 12/13/14-12/24/14   left upper lobe nodule 50 gray  . Renal artery stenosis (HCC)    right  . Renal insufficiency   . Skin cancer    Lung Ca- Left Lung  . Type II or unspecified type diabetes mellitus without mention of complication, not stated as uncontrolled    02/14/2016 diet controlled    Past Surgical History:  Procedure Laterality Date  . COLONOSCOPY    . NO PAST SURGERIES    . SEGMENTECOMY Left 06/16/2012   Procedure: left lower lobe superior SEGMENTECTOMY with node dissection;  Surgeon: [name], MD;  Location: [facility];  Service: Thoracic;  Laterality: Left;  left superior  . VIDEO ASSISTED THORACOSCOPY Left 06/16/2012   Procedure: VIDEO ASSISTED THORACOSCOPY;  Surgeon: [name], MD;  Location: [facility] OR;  Service: Thoracic;  Laterality: Left;    Current Medications: Current Meds  Medication Sig  . allopurinol (ZYLOPRIM) 100 MG tablet Take 100 mg by mouth daily.   Marland Kitchen apixaban (ELIQUIS) 5 MG TABS tablet Take 1 tablet (5 mg total) by mouth 2 (two) times daily.  . cholecalciferol (VITAMIN D) 1000 units tablet Take 1,000 Units  by mouth 2 (two) times daily.   Marland Kitchen diltiazem (CARDIZEM CD) 180 MG 24 hr capsule Take 1 capsule (180 mg total) by mouth daily.  Marland Kitchen gabapentin (NEURONTIN) 400 MG capsule Take 400 mg by mouth daily.  . metFORMIN (GLUCOPHAGE-XR) 750 MG 24 hr tablet Take 750 mg by mouth daily.   . metoprolol succinate (TOPROL-XL) 100 MG 24 hr tablet Take 1 tablet (100 mg total) by mouth daily. Take with or immediately following a meal.  . tamsulosin (FLOMAX) 0.4 MG CAPS capsule Take 0.4 mg by mouth daily.     Allergies:   Ace inhibitors; Codeine; Indomethacin; and Losartan   Social History   Socioeconomic History  . Marital status: Married    Spouse name: [name]  . Number of children: 3  . Years of education: 51    . Highest education level: Not on file  Occupational History  . Occupation: retired    Comment: Herbalist  Social Needs  . Financial resource strain: Not on file  . Food insecurity:    Worry: Not on file    Inability: Not on file  . Transportation needs:    Medical: Not on file    Non-medical: Not on file  Tobacco Use  . Smoking status: Former Smoker    Packs/day: 1.50    Years: 30.00    Pack years: 45.00    Types: Cigarettes    Last attempt to quit: 01/16/1976    Years since quitting: 41.7  . Smokeless tobacco: Never Used  Substance and Sexual Activity  . Alcohol use: No    Alcohol/week: 0.0 standard drinks  . Drug use: No  . Sexual activity: Not on file  Lifestyle  . Physical activity:    Days per week: Not on file    Minutes per session: Not on file  . Stress: Not on file  Relationships  . Social connections:    Talks on phone: Not on file    Gets together: Not on file    Attends religious service: Not on file    Active member of club or organization: Not on file    Attends meetings of clubs or organizations: Not on file    Relationship status: Not on file  Other Topics Concern  . Not on file  Social History Narrative   Lives at home with wife   Caffeine- coffee, 1 cup daily     Family History: The patient's family history includes Cancer in his father; Diabetes in his mother; Heart disease in his mother; Hypertension in his mother; Kidney disease in his mother; Varicose Veins in his mother.  ROS:   Please see the history of present illness.     All other systems reviewed and are negative.  EKGs/Labs/Other Studies Reviewed:    The following studies were reviewed today: Prior office notes, EKG, lab work, echocardiogram reviewed  EKG:  EKG is  ordered today.  The ekg ordered today demonstrates prior EKG 09/09/2017 showed atrial fibrillation heart rate 129 bpm.  Prior to that one was 86.  Recent Labs: 09/04/2017: BUN 33; Creatinine, Ser 1.49;  Hemoglobin 10.3; Platelets 227; Potassium 4.3; Sodium 135  Recent Lipid Panel    Component Value Date/Time   CHOL 186 08/17/2014 0928   TRIG 234 (H) 08/17/2014 0928   HDL 40 08/17/2014 0928   CHOLHDL 4.7 08/17/2014 0928   VLDL 47 (H) 08/17/2014 0928   LDLCALC 99 08/17/2014 0928   LDLDIRECT 100.5 12/22/2012 1451    Physical Exam:  VS:  BP (!) 102/52   Pulse 70   Ht 5' 8.5" (1.74 m)   Wt 148 lb (67.1 kg)   SpO2 92%   BMI 22.18 kg/m     Wt Readings from Last 3 Encounters:  10/16/17 148 lb (67.1 kg)  09/09/17 153 lb 9.6 oz (69.7 kg)  09/04/17 156 lb 8.4 oz (71 kg)     GEN:  Well nourished, well developed in no acute distress, Elderly HEENT: Normal NECK: No JVD; No carotid bruits LYMPHATICS: No lymphadenopathy CARDIAC: IRRR, no murmurs, rubs, gallops RESPIRATORY:  Clear to auscultation without rales, wheezing or rhonchi  ABDOMEN: Soft, non-tender, non-distended MUSCULOSKELETAL:  No edema; No deformity  SKIN: Warm and dry NEUROLOGIC:  Alert and oriented x 3 PSYCHIATRIC:  Normal affect   ASSESSMENT:    1. Persistent atrial fibrillation   2. Chronic anticoagulation   3. Bilateral carotid artery stenosis    PLAN:    In order of problems listed above:  Atrial fibrillation, permanent - Eliquis 5 mg twice a day - Diltiazem increased at last clinic visit as above.  Coronary artery calcification -Secondary prevention, nuclear stress test low risk with no ischemia and normal EF.  Stage Ia non-small cell carcinoma - Dr. Roxan Hockey removed, radiation, remission.  Hyperlipidemia - Statin therapy.  Doing well, no myalgias.  Carotid artery disease -Bruits are heard on exam.  Dr. Donnetta Hutching has been following, stable.   Medication Adjustments/Labs and Tests Ordered: Current medicines are reviewed at length with the patient today.  Concerns regarding medicines are outlined above.  No orders of the defined types were placed in this encounter.  No orders of the defined  types were placed in this encounter.   Patient Instructions  Medication Instructions:  The current medical regimen is effective;  continue present plan and medications.  Follow-Up: Follow up in 6 months with Truitt Merle, NP.  You will receive a letter in the mail 2 months before you are due.  Please call us when you receive this letter to schedule your follow up appointment.  Follow up in 1 year with Dr. Marlou Porch.  You will receive a letter in the mail 2 months before you are due.  Please call us when you receive this letter to schedule your follow up appointment.  If you need a refill on your cardiac medications before your next appointment, please call your pharmacy.  Thank you for choosing [facility]!!        Signed, Candee Furbish, MD  10/16/2017 12:25 PM    [place]

## 2017-10-16 NOTE — Patient Instructions (Signed)
Medication Instructions:  The current medical regimen is effective;  continue present plan and medications.  Follow-Up: Follow up in 6 months with Lori Gerhardt, NP.  You will receive a letter in the mail 2 months before you are due.  Please call us when you receive this letter to schedule your follow up appointment.  Follow up in 1 year with Dr. Skains.  You will receive a letter in the mail 2 months before you are due.  Please call us when you receive this letter to schedule your follow up appointment.  If you need a refill on your cardiac medications before your next appointment, please call your pharmacy.  Thank you for choosing [facility]!!     

## 2017-10-17 DIAGNOSIS — E1140 Type 2 diabetes mellitus with diabetic neuropathy, unspecified: Secondary | ICD-10-CM | POA: Diagnosis not present

## 2017-10-21 ENCOUNTER — Encounter: Payer: Self-pay | Admitting: Podiatry

## 2017-10-21 ENCOUNTER — Ambulatory Visit (INDEPENDENT_AMBULATORY_CARE_PROVIDER_SITE_OTHER): Payer: Medicare Other | Admitting: Podiatry

## 2017-10-21 DIAGNOSIS — M79675 Pain in left toe(s): Secondary | ICD-10-CM | POA: Diagnosis not present

## 2017-10-21 DIAGNOSIS — E1142 Type 2 diabetes mellitus with diabetic polyneuropathy: Secondary | ICD-10-CM

## 2017-10-21 DIAGNOSIS — B351 Tinea unguium: Secondary | ICD-10-CM | POA: Diagnosis not present

## 2017-10-21 DIAGNOSIS — M79674 Pain in right toe(s): Secondary | ICD-10-CM

## 2017-10-21 NOTE — Progress Notes (Signed)
Subjective:   Patient ID: Benjamin Patel, male   DOB: 84 y.o.   MRN: [id number]   HPI Patient presents with nail disease 1-5 both feet with nails that are tender when they get ingrown in the corners and she cannot cut them out   ROS      Objective:  Physical Exam  Nailbeds that are incurvated in the corners with thickness and brittle type to break with discomfort upon palpation     Assessment:  Mycotic nail infection 1-5 both feet with pain     Plan:  Debride painful nailbeds 1-5 both feet with no iatrogenic bleeding noted

## 2017-10-22 DIAGNOSIS — E1140 Type 2 diabetes mellitus with diabetic neuropathy, unspecified: Secondary | ICD-10-CM | POA: Diagnosis not present

## 2017-10-22 DIAGNOSIS — R197 Diarrhea, unspecified: Secondary | ICD-10-CM | POA: Diagnosis not present

## 2017-10-23 ENCOUNTER — Emergency Department (HOSPITAL_COMMUNITY): Payer: Medicare Other

## 2017-10-23 ENCOUNTER — Inpatient Hospital Stay (HOSPITAL_COMMUNITY)
Admission: EM | Admit: 2017-10-23 | Discharge: 2017-10-30 | DRG: 871 | Disposition: A | Discharge status: Hospice | Payer: Medicare Other | Attending: Internal Medicine | Admitting: Internal Medicine

## 2017-10-23 ENCOUNTER — Encounter (HOSPITAL_COMMUNITY): Payer: Self-pay

## 2017-10-23 ENCOUNTER — Inpatient Hospital Stay (HOSPITAL_COMMUNITY): Payer: Medicare Other

## 2017-10-23 ENCOUNTER — Other Ambulatory Visit: Payer: Self-pay

## 2017-10-23 DIAGNOSIS — J439 Emphysema, unspecified: Secondary | ICD-10-CM | POA: Diagnosis not present

## 2017-10-23 DIAGNOSIS — N400 Enlarged prostate without lower urinary tract symptoms: Secondary | ICD-10-CM | POA: Diagnosis present

## 2017-10-23 DIAGNOSIS — I4821 Permanent atrial fibrillation: Secondary | ICD-10-CM | POA: Diagnosis present

## 2017-10-23 DIAGNOSIS — Z87891 Personal history of nicotine dependence: Secondary | ICD-10-CM

## 2017-10-23 DIAGNOSIS — R300 Dysuria: Secondary | ICD-10-CM | POA: Diagnosis not present

## 2017-10-23 DIAGNOSIS — Z8719 Personal history of other diseases of the digestive system: Secondary | ICD-10-CM

## 2017-10-23 DIAGNOSIS — G4700 Insomnia, unspecified: Secondary | ICD-10-CM | POA: Diagnosis not present

## 2017-10-23 DIAGNOSIS — I083 Combined rheumatic disorders of mitral, aortic and tricuspid valves: Secondary | ICD-10-CM | POA: Diagnosis present

## 2017-10-23 DIAGNOSIS — I959 Hypotension, unspecified: Secondary | ICD-10-CM | POA: Diagnosis not present

## 2017-10-23 DIAGNOSIS — D631 Anemia in chronic kidney disease: Secondary | ICD-10-CM | POA: Diagnosis present

## 2017-10-23 DIAGNOSIS — A419 Sepsis, unspecified organism: Secondary | ICD-10-CM | POA: Diagnosis present

## 2017-10-23 DIAGNOSIS — Z7401 Bed confinement status: Secondary | ICD-10-CM | POA: Diagnosis not present

## 2017-10-23 DIAGNOSIS — R0602 Shortness of breath: Secondary | ICD-10-CM | POA: Diagnosis not present

## 2017-10-23 DIAGNOSIS — Z8619 Personal history of other infectious and parasitic diseases: Secondary | ICD-10-CM | POA: Diagnosis not present

## 2017-10-23 DIAGNOSIS — R509 Fever, unspecified: Secondary | ICD-10-CM | POA: Diagnosis not present

## 2017-10-23 DIAGNOSIS — Z801 Family history of malignant neoplasm of trachea, bronchus and lung: Secondary | ICD-10-CM

## 2017-10-23 DIAGNOSIS — M109 Gout, unspecified: Secondary | ICD-10-CM | POA: Diagnosis present

## 2017-10-23 DIAGNOSIS — K5730 Diverticulosis of large intestine without perforation or abscess without bleeding: Secondary | ICD-10-CM | POA: Diagnosis present

## 2017-10-23 DIAGNOSIS — R0902 Hypoxemia: Secondary | ICD-10-CM

## 2017-10-23 DIAGNOSIS — I10 Essential (primary) hypertension: Secondary | ICD-10-CM | POA: Diagnosis not present

## 2017-10-23 DIAGNOSIS — I739 Peripheral vascular disease, unspecified: Secondary | ICD-10-CM | POA: Diagnosis present

## 2017-10-23 DIAGNOSIS — Z85828 Personal history of other malignant neoplasm of skin: Secondary | ICD-10-CM

## 2017-10-23 DIAGNOSIS — I2510 Atherosclerotic heart disease of native coronary artery without angina pectoris: Secondary | ICD-10-CM | POA: Diagnosis not present

## 2017-10-23 DIAGNOSIS — Z01810 Encounter for preprocedural cardiovascular examination: Secondary | ICD-10-CM | POA: Diagnosis not present

## 2017-10-23 DIAGNOSIS — J449 Chronic obstructive pulmonary disease, unspecified: Secondary | ICD-10-CM | POA: Diagnosis present

## 2017-10-23 DIAGNOSIS — M1990 Unspecified osteoarthritis, unspecified site: Secondary | ICD-10-CM | POA: Diagnosis present

## 2017-10-23 DIAGNOSIS — J90 Pleural effusion, not elsewhere classified: Secondary | ICD-10-CM | POA: Diagnosis not present

## 2017-10-23 DIAGNOSIS — R600 Localized edema: Secondary | ICD-10-CM | POA: Diagnosis present

## 2017-10-23 DIAGNOSIS — R3915 Urgency of urination: Secondary | ICD-10-CM | POA: Diagnosis not present

## 2017-10-23 DIAGNOSIS — Z66 Do not resuscitate: Secondary | ICD-10-CM | POA: Diagnosis present

## 2017-10-23 DIAGNOSIS — Z8249 Family history of ischemic heart disease and other diseases of the circulatory system: Secondary | ICD-10-CM

## 2017-10-23 DIAGNOSIS — R6521 Severe sepsis with septic shock: Secondary | ICD-10-CM | POA: Diagnosis present

## 2017-10-23 DIAGNOSIS — F419 Anxiety disorder, unspecified: Secondary | ICD-10-CM | POA: Diagnosis not present

## 2017-10-23 DIAGNOSIS — R0689 Other abnormalities of breathing: Secondary | ICD-10-CM | POA: Diagnosis not present

## 2017-10-23 DIAGNOSIS — I129 Hypertensive chronic kidney disease with stage 1 through stage 4 chronic kidney disease, or unspecified chronic kidney disease: Secondary | ICD-10-CM | POA: Diagnosis present

## 2017-10-23 DIAGNOSIS — Z87440 Personal history of urinary (tract) infections: Secondary | ICD-10-CM

## 2017-10-23 DIAGNOSIS — N3000 Acute cystitis without hematuria: Secondary | ICD-10-CM | POA: Diagnosis not present

## 2017-10-23 DIAGNOSIS — Z888 Allergy status to other drugs, medicaments and biological substances status: Secondary | ICD-10-CM

## 2017-10-23 DIAGNOSIS — I6523 Occlusion and stenosis of bilateral carotid arteries: Secondary | ICD-10-CM | POA: Diagnosis present

## 2017-10-23 DIAGNOSIS — E871 Hypo-osmolality and hyponatremia: Secondary | ICD-10-CM | POA: Diagnosis not present

## 2017-10-23 DIAGNOSIS — R197 Diarrhea, unspecified: Secondary | ICD-10-CM | POA: Diagnosis not present

## 2017-10-23 DIAGNOSIS — I701 Atherosclerosis of renal artery: Secondary | ICD-10-CM | POA: Diagnosis present

## 2017-10-23 DIAGNOSIS — I4891 Unspecified atrial fibrillation: Secondary | ICD-10-CM | POA: Diagnosis not present

## 2017-10-23 DIAGNOSIS — E782 Mixed hyperlipidemia: Secondary | ICD-10-CM | POA: Diagnosis present

## 2017-10-23 DIAGNOSIS — K5792 Diverticulitis of intestine, part unspecified, without perforation or abscess without bleeding: Secondary | ICD-10-CM | POA: Diagnosis not present

## 2017-10-23 DIAGNOSIS — N390 Urinary tract infection, site not specified: Secondary | ICD-10-CM | POA: Diagnosis present

## 2017-10-23 DIAGNOSIS — Z833 Family history of diabetes mellitus: Secondary | ICD-10-CM

## 2017-10-23 DIAGNOSIS — Z885 Allergy status to narcotic agent status: Secondary | ICD-10-CM | POA: Diagnosis not present

## 2017-10-23 DIAGNOSIS — E870 Hyperosmolality and hypernatremia: Secondary | ICD-10-CM | POA: Diagnosis present

## 2017-10-23 DIAGNOSIS — Z7901 Long term (current) use of anticoagulants: Secondary | ICD-10-CM

## 2017-10-23 DIAGNOSIS — N183 Chronic kidney disease, stage 3 (moderate): Secondary | ICD-10-CM | POA: Diagnosis present

## 2017-10-23 DIAGNOSIS — I340 Nonrheumatic mitral (valve) insufficiency: Secondary | ICD-10-CM | POA: Diagnosis not present

## 2017-10-23 DIAGNOSIS — N289 Disorder of kidney and ureter, unspecified: Secondary | ICD-10-CM | POA: Diagnosis not present

## 2017-10-23 DIAGNOSIS — M2550 Pain in unspecified joint: Secondary | ICD-10-CM | POA: Diagnosis not present

## 2017-10-23 DIAGNOSIS — Z7189 Other specified counseling: Secondary | ICD-10-CM

## 2017-10-23 DIAGNOSIS — I6529 Occlusion and stenosis of unspecified carotid artery: Secondary | ICD-10-CM | POA: Diagnosis present

## 2017-10-23 DIAGNOSIS — E1129 Type 2 diabetes mellitus with other diabetic kidney complication: Secondary | ICD-10-CM | POA: Diagnosis present

## 2017-10-23 DIAGNOSIS — R05 Cough: Secondary | ICD-10-CM | POA: Diagnosis not present

## 2017-10-23 DIAGNOSIS — Z85118 Personal history of other malignant neoplasm of bronchus and lung: Secondary | ICD-10-CM

## 2017-10-23 DIAGNOSIS — R531 Weakness: Secondary | ICD-10-CM | POA: Diagnosis not present

## 2017-10-23 DIAGNOSIS — N321 Vesicointestinal fistula: Secondary | ICD-10-CM | POA: Clinically undetermined

## 2017-10-23 DIAGNOSIS — N182 Chronic kidney disease, stage 2 (mild): Secondary | ICD-10-CM | POA: Diagnosis present

## 2017-10-23 DIAGNOSIS — E860 Dehydration: Secondary | ICD-10-CM | POA: Diagnosis present

## 2017-10-23 DIAGNOSIS — Z79899 Other long term (current) drug therapy: Secondary | ICD-10-CM

## 2017-10-23 DIAGNOSIS — N202 Calculus of kidney with calculus of ureter: Secondary | ICD-10-CM | POA: Diagnosis present

## 2017-10-23 DIAGNOSIS — R627 Adult failure to thrive: Secondary | ICD-10-CM | POA: Diagnosis present

## 2017-10-23 DIAGNOSIS — Z515 Encounter for palliative care: Secondary | ICD-10-CM | POA: Diagnosis not present

## 2017-10-23 DIAGNOSIS — N179 Acute kidney failure, unspecified: Secondary | ICD-10-CM | POA: Diagnosis present

## 2017-10-23 DIAGNOSIS — E785 Hyperlipidemia, unspecified: Secondary | ICD-10-CM | POA: Diagnosis not present

## 2017-10-23 DIAGNOSIS — Z923 Personal history of irradiation: Secondary | ICD-10-CM

## 2017-10-23 DIAGNOSIS — Z841 Family history of disorders of kidney and ureter: Secondary | ICD-10-CM

## 2017-10-23 DIAGNOSIS — I482 Chronic atrial fibrillation, unspecified: Secondary | ICD-10-CM | POA: Diagnosis not present

## 2017-10-23 DIAGNOSIS — E1122 Type 2 diabetes mellitus with diabetic chronic kidney disease: Secondary | ICD-10-CM | POA: Diagnosis present

## 2017-10-23 LAB — CBC WITH DIFFERENTIAL/PLATELET
Abs Immature Granulocytes: 0.3 10*3/uL — ABNORMAL HIGH (ref 0.00–0.07)
BASOS ABS: 0.1 10*3/uL (ref 0.0–0.1)
BASOS PCT: 0 %
EOS ABS: 0 10*3/uL (ref 0.0–0.5)
Eosinophils Relative: 0 %
HCT: 29.1 % — ABNORMAL LOW (ref 39.0–52.0)
Hemoglobin: 9 g/dL — ABNORMAL LOW (ref 13.0–17.0)
IMMATURE GRANULOCYTES: 2 %
Lymphocytes Relative: 7 %
Lymphs Abs: 1.2 10*3/uL (ref 0.7–4.0)
MCH: 28.7 pg (ref 26.0–34.0)
MCHC: 30.9 g/dL (ref 30.0–36.0)
MCV: 92.7 fL (ref 80.0–100.0)
MONOS PCT: 6 %
Monocytes Absolute: 1.2 10*3/uL — ABNORMAL HIGH (ref 0.1–1.0)
NEUTROS ABS: 15.3 10*3/uL — AB (ref 1.7–7.7)
NEUTROS PCT: 85 %
NRBC: 0 % (ref 0.0–0.2)
PLATELETS: 184 10*3/uL (ref 150–400)
RBC: 3.14 MIL/uL — AB (ref 4.22–5.81)
RDW: 15.5 % (ref 11.5–15.5)
WBC: 18 10*3/uL — AB (ref 4.0–10.5)

## 2017-10-23 LAB — URINALYSIS, ROUTINE W REFLEX MICROSCOPIC
Bilirubin Urine: NEGATIVE
GLUCOSE, UA: NEGATIVE mg/dL
KETONES UR: NEGATIVE mg/dL
Nitrite: NEGATIVE
PH: 7 (ref 5.0–8.0)
Protein, ur: >=300 mg/dL — AB
Specific Gravity, Urine: 1.013 (ref 1.005–1.030)
WBC, UA: >50 WBC/hpf — ABNORMAL HIGH (ref 0–5)

## 2017-10-23 LAB — I-STAT CHEM 8, ED
BUN: 46 mg/dL — AB (ref 8–23)
CALCIUM ION: 1.25 mmol/L (ref 1.15–1.40)
Chloride: 94 mmol/L — ABNORMAL LOW (ref 98–111)
Creatinine, Ser: 1.8 mg/dL — ABNORMAL HIGH (ref 0.61–1.24)
Glucose, Bld: 115 mg/dL — ABNORMAL HIGH (ref 70–99)
HEMATOCRIT: 26 % — AB (ref 39.0–52.0)
Hemoglobin: 8.8 g/dL — ABNORMAL LOW (ref 13.0–17.0)
Potassium: 4.6 mmol/L (ref 3.5–5.1)
SODIUM: 133 mmol/L — AB (ref 135–145)
TCO2: 30 mmol/L (ref 22–32)

## 2017-10-23 LAB — COMPREHENSIVE METABOLIC PANEL
ALK PHOS: 53 U/L (ref 38–126)
ALT: 15 U/L (ref 0–44)
AST: 26 U/L (ref 15–41)
Albumin: 2.6 g/dL — ABNORMAL LOW (ref 3.5–5.0)
Anion gap: 9 (ref 5–15)
BUN: 43 mg/dL — ABNORMAL HIGH (ref 8–23)
CHLORIDE: 97 mmol/L — AB (ref 98–111)
CO2: 30 mmol/L (ref 22–32)
CREATININE: 1.99 mg/dL — AB (ref 0.61–1.24)
Calcium: 9 mg/dL (ref 8.9–10.3)
GFR calc non Af Amer: 28 mL/min — ABNORMAL LOW (ref >60)
GFR, EST AFRICAN AMERICAN: 33 mL/min — AB (ref >60)
Glucose, Bld: 126 mg/dL — ABNORMAL HIGH (ref 70–99)
Potassium: 4.6 mmol/L (ref 3.5–5.1)
Sodium: 136 mmol/L (ref 135–145)
Total Bilirubin: 0.9 mg/dL (ref 0.3–1.2)
Total Protein: 5.1 g/dL — ABNORMAL LOW (ref 6.5–8.1)

## 2017-10-23 LAB — I-STAT CG4 LACTIC ACID, ED: Lactic Acid, Venous: 3.2 mmol/L (ref 0.5–1.9)

## 2017-10-23 LAB — I-STAT TROPONIN, ED: Troponin i, poc: 0 ng/mL (ref 0.00–0.08)

## 2017-10-23 LAB — MRSA PCR SCREENING: MRSA BY PCR: NEGATIVE

## 2017-10-23 LAB — PROTIME-INR
INR: 3.22
PROTHROMBIN TIME: 32.7 s — AB (ref 11.4–15.2)

## 2017-10-23 LAB — CBG MONITORING, ED: Glucose-Capillary: 117 mg/dL — ABNORMAL HIGH (ref 70–99)

## 2017-10-23 MED ORDER — VANCOMYCIN HCL IN DEXTROSE 1-5 GM/200ML-% IV SOLN
1000.0000 mg | Freq: Once | INTRAVENOUS | Status: AC
  Administered 2017-10-23: 1000 mg via INTRAVENOUS
  Filled 2017-10-23: qty 200

## 2017-10-23 MED ORDER — PHENYLEPHRINE HCL-NACL 10-0.9 MG/250ML-% IV SOLN
25.0000 ug/min | INTRAVENOUS | Status: DC
  Administered 2017-10-23 (×2): 25 ug/min via INTRAVENOUS
  Administered 2017-10-23: 45 ug/min via INTRAVENOUS
  Administered 2017-10-24: 30 ug/min via INTRAVENOUS
  Administered 2017-10-24 (×2): 75 ug/min via INTRAVENOUS
  Administered 2017-10-24: 65 ug/min via INTRAVENOUS
  Administered 2017-10-24: 40 ug/min via INTRAVENOUS
  Filled 2017-10-23 (×6): qty 250

## 2017-10-23 MED ORDER — ORAL CARE MOUTH RINSE
15.0000 mL | Freq: Two times a day (BID) | OROMUCOSAL | Status: DC
  Administered 2017-10-23 – 2017-10-24 (×2): 15 mL via OROMUCOSAL
  Administered 2017-10-24: 30 mL via OROMUCOSAL
  Administered 2017-10-25 – 2017-10-30 (×11): 15 mL via OROMUCOSAL

## 2017-10-23 MED ORDER — VANCOMYCIN HCL IN DEXTROSE 1-5 GM/200ML-% IV SOLN
1000.0000 mg | INTRAVENOUS | Status: DC
  Administered 2017-10-24: 1000 mg via INTRAVENOUS
  Filled 2017-10-23: qty 200

## 2017-10-23 MED ORDER — SODIUM CHLORIDE 0.9 % IV BOLUS
1000.0000 mL | Freq: Once | INTRAVENOUS | Status: AC
  Administered 2017-10-23: 1000 mL via INTRAVENOUS

## 2017-10-23 MED ORDER — PIPERACILLIN-TAZOBACTAM 3.375 G IVPB 30 MIN
3.3750 g | Freq: Once | INTRAVENOUS | Status: AC
  Administered 2017-10-23: 3.375 g via INTRAVENOUS
  Filled 2017-10-23: qty 50

## 2017-10-23 MED ORDER — SODIUM CHLORIDE 0.9 % IV SOLN
INTRAVENOUS | Status: DC
  Administered 2017-10-23: 16:00:00 via INTRAVENOUS

## 2017-10-23 MED ORDER — SODIUM CHLORIDE 0.9 % IV SOLN
1.0000 g | Freq: Two times a day (BID) | INTRAVENOUS | Status: DC
  Administered 2017-10-23 – 2017-10-25 (×4): 1 g via INTRAVENOUS
  Filled 2017-10-23 (×4): qty 1

## 2017-10-23 MED ORDER — CHLORHEXIDINE GLUCONATE 0.12 % MT SOLN
15.0000 mL | Freq: Two times a day (BID) | OROMUCOSAL | Status: DC
  Administered 2017-10-24 – 2017-10-30 (×12): 15 mL via OROMUCOSAL
  Filled 2017-10-23 (×10): qty 15

## 2017-10-23 MED ORDER — SODIUM CHLORIDE 0.9 % IV SOLN
250.0000 mL | INTRAVENOUS | Status: DC
  Administered 2017-10-23: 250 mL via INTRAVENOUS

## 2017-10-23 MED ORDER — ACETAMINOPHEN 325 MG PO TABS
650.0000 mg | ORAL_TABLET | ORAL | Status: DC | PRN
  Administered 2017-10-24: 650 mg via ORAL
  Filled 2017-10-23: qty 2

## 2017-10-23 NOTE — Progress Notes (Signed)
Pharmacy Antibiotic Note  Benjamin Patel is a 84 y.o. male with PMH multiple UTI since February treated with multiple courses of abx, admitted on 10/23/2017 with septic shock d/t UTI. Frank pus noted in urine, good UA sample and grossly positive. Also noted to have loose stools, although quality not specified. Pharmacy has been consulted for Vancomycin and meropenem dosing.  Plan:  Vancomycin 1000 mg IV now, then 1000 mg IV q36 hr (est AUC 508 based on SCr 1.8)  Measure vancomycin AUC at steady state as indicated  Meropenem 1 g IV q12 hr   Height: 5\' 8"  (172.7 cm) Weight: 162 lb 7.7 oz (73.7 kg) IBW/kg (Calculated) : 68.4  Temp (24hrs), Avg:101 F (38.3 C), Min:100.3 F (37.9 C), Max:101.6 F (38.7 C)  Recent Labs  Lab 10/23/17 1121 10/23/17 1126  WBC 18.0*  --   CREATININE 1.99* 1.80*  LATICACIDVEN 3.20*  --     Estimated Creatinine Clearance: 27.4 mL/min (A) (by C-G formula based on SCr of 1.8 mg/dL (H)).    Allergies  Allergen Reactions  . Ace Inhibitors Cough  . Codeine Other (See Comments)    GI Upset  . Indomethacin Other (See Comments)    Elevated BP  . Losartan Swelling    Angioedema    Antimicrobials this admission: 10/9 Vancomycin >> 10/9 meropenem >>   Dose adjustments this admission: n/a  Microbiology results: 10/9 BCx: sent 10/9 UCx: sent  10/9 Cdiff: sent 10/9 MRSA PCR: sent  Thank you for allowing pharmacy to be a part of this patient's care.  Reuel Boom, PharmD, BCPS [phone] 10/23/2017, 4:16 PM

## 2017-10-23 NOTE — ED Notes (Signed)
Report given to [facility], charge RN of ICU.

## 2017-10-23 NOTE — ED Notes (Addendum)
ED provider at bedside. Patients wife request him to be DNR and patient agrees.

## 2017-10-23 NOTE — ED Triage Notes (Signed)
-  Brought in via [facility], patient is from home -C/C weakness, he was in bed and tried to get up, slid out of bed, complaint is legs keep giving out, x1 month, UTI and diarrhea for x1 month, today unable to walk, cannot walk without cane and assistance  -trted for UTI w/ antibitotic -fever 99.9 forehead, 106 axillary, tylenol 1000 mg -850 cc NS -Alert and Oriented x 4 -lungs clear bilateral  -18 LAC  Vitals  84/46 (orthostatic changes, no mental changes, this BP was post-fluids) -HR 100 -etCO2 38 -O2 92% RA, gave 2L [place] -156 CBG

## 2017-10-23 NOTE — ED Notes (Signed)
Both cultures sent before antibiotics given.

## 2017-10-23 NOTE — ED Notes (Signed)
CGB 117.

## 2017-10-23 NOTE — ED Provider Notes (Signed)
[facility] DEPT Provider Note   CSN: [id number] Arrival date & time: 10/23/17  1047     History   Chief Complaint Chief Complaint  Patient presents with  . Fever  . Weakness    HPI Benjamin Patel is a 84 y.o. male.  84 year old male with prior medical history as detailed below presents with complaint of generalized weakness.  Patient arrives by EMS.  Majority history is provided by the patient's wife at bedside.  Patient has reportedly had decreased p.o. intake for the last several days.  The patient may have started to have a fever last night.  Patient has had problems the past with frequent UTIs.  Patient denies current chest pain or shortness of breath.  He complains of feeling globally weak.  Patient was given Tylenol in route to the ED by EMS.  He received approximately 800 mL's of normal saline from EMS as well.  The history is provided by the patient and medical records.  Fever   This is a new problem. The current episode started 12 to 24 hours ago. The problem occurs rarely. The problem has not changed since onset.His temperature was unmeasured prior to arrival. Pertinent negatives include no chest pain, no vomiting, no congestion and no sore throat.  Weakness  Pertinent negatives include no chest pain and no vomiting.    Past Medical History:  Diagnosis Date  . Abnormal prostate exam   . Arthritis    Gout   . Asthma    as a child  . Carotid stenosis   . Carotid stenosis right side   Dr.Early does carotid ultrasounds yrly-last one Mar 14-report in epic  . Cholelithiasis   . COPD (chronic obstructive pulmonary disease) (HCC)    slight   . Enlarged prostate    slightly   . Essential hypertension, benign    takes Clonodine,Losartan,and Metoprolol and HCTZ  . Gout    no meds required  . History of bronchitis   . History of colon polyps   . History of shingles   . Lung cancer ([place]) dx'd 2014   LLL resection; xrt  . Mixed  hyperlipidemia    takes Pravastatin daily  . PAC (premature atrial contraction)   . Pneumonia 02/08/2012  . PVCs (premature ventricular contractions)   . Radiation 12/13/14-12/24/14   left upper lobe nodule 50 gray  . Renal artery stenosis (HCC)    right  . Renal insufficiency   . Skin cancer    Lung Ca- Left Lung  . Type II or unspecified type diabetes mellitus without mention of complication, not stated as uncontrolled    02/14/2016 diet controlled    Patient Active Problem List   Diagnosis Date Noted  . Asymptomatic hypertensive urgency 02/14/2016  . Solitary pulmonary nodule 01/02/2015  . Lung mass 02/02/2014  . Diabetes ([place]) 02/10/2013  . Lung cancer ([place]) 02/10/2013  . Carotid arterial disease ([place]) 02/10/2013  . Coronary artery calcification 12/22/2012  . Atrial fibrillation ([place]) 06/23/2012  . CKD (chronic kidney disease) stage 2, GFR 60-89 ml/min 06/19/2012  . COPD/ GOLD III 06/17/2012  . DM (diabetes mellitus) type II controlled with renal manifestation ([place]) 06/17/2012  . Occlusion and stenosis of carotid artery without mention of cerebral infarction 03/20/2012  . Non-small cell carcinoma of lung, stage 1 ([place]) 02/10/2012  . Essential hypertension, benign   . Mixed hyperlipidemia   . Gout   . DM (diabetes mellitus), type 2 with peripheral vascular complications ([place])   .  Carotid stenosis   . Cholelithiasis   . Renal insufficiency   . Renal artery stenosis [facility])     Past Surgical History:  Procedure Laterality Date  . COLONOSCOPY    . NO PAST SURGERIES    . SEGMENTECOMY Left 06/16/2012   Procedure: left lower lobe superior SEGMENTECTOMY with node dissection;  Surgeon: [name], MD;  Location: [facility];  Service: Thoracic;  Laterality: Left;  left superior  . VIDEO ASSISTED THORACOSCOPY Left 06/16/2012   Procedure: VIDEO ASSISTED THORACOSCOPY;  Surgeon: [name], MD;  Location: [facility];  Service: Thoracic;  Laterality: Left;        Home  Medications    Prior to Admission medications   Medication Sig Start Date End Date Taking? Authorizing Provider  allopurinol (ZYLOPRIM) 100 MG tablet Take 100 mg by mouth 2 (two) times daily.  05/16/16  Yes [provider]  apixaban (ELIQUIS) 5 MG TABS tablet Take 1 tablet (5 mg total) by mouth 2 (two) times daily. 06/18/17  Yes Jerline Pain, MD  cholecalciferol (VITAMIN D) 1000 units tablet Take 1,000 Units by mouth 2 (two) times daily.    Yes [provider]  diltiazem (CARDIZEM CD) 180 MG 24 hr capsule Take 1 capsule (180 mg total) by mouth daily. 09/09/17 12/08/17 Yes Jerline Pain, MD  doxycycline (VIBRA-TABS) 100 MG tablet Take 100 mg by mouth every 12 (twelve) hours. 10/17/17  Yes [provider]  gabapentin (NEURONTIN) 400 MG capsule Take 400 mg by mouth daily.   Yes [provider]  metoprolol succinate (TOPROL-XL) 100 MG 24 hr tablet Take 1 tablet (100 mg total) by mouth daily. Take with or immediately following a meal. 08/17/14  Yes Daub, Loura Back, MD  mirabegron ER (MYRBETRIQ) 25 MG TB24 tablet Take 25 mg by mouth daily.   Yes [provider]  Probiotic Product (PROBIOTIC PO) Take 1 tablet by mouth daily.   Yes [provider]  tamsulosin (FLOMAX) 0.4 MG CAPS capsule Take 0.4 mg by mouth daily. 08/10/17  Yes [provider]    Family History Family History  Problem Relation Age of Onset  . Heart disease Mother   . Diabetes Mother        AKA  Right   . Hypertension Mother   . Varicose Veins Mother   . Kidney disease Mother        One Kidney  . Cancer Father        Lung    Social History Social History   Tobacco Use  . Smoking status: Former Smoker    Packs/day: 1.50    Years: 30.00    Pack years: 45.00    Types: Cigarettes    Last attempt to quit: 01/16/1976    Years since quitting: 41.7  . Smokeless tobacco: Never Used  Substance Use Topics  . Alcohol use: No    Alcohol/week: 0.0 standard drinks  . Drug  use: No     Allergies   Ace inhibitors; Codeine; Indomethacin; and Losartan   Review of Systems Review of Systems  Constitutional: Positive for fever.  HENT: Negative for congestion and sore throat.   Cardiovascular: Negative for chest pain.  Gastrointestinal: Negative for vomiting.  Neurological: Positive for weakness.  All other systems reviewed and are negative.    Physical Exam Updated Vital Signs BP (!) 77/46   Pulse 75   Temp 100.3 F (37.9 C) (Rectal)   Resp 15   Ht 5\' 8"  (1.727  m)   Wt 67.1 kg   SpO2 98%   BMI 22.50 kg/m   Physical Exam  Constitutional: He is oriented to person, place, and time. He appears well-developed and well-nourished. No distress.  HENT:  Head: Normocephalic and atraumatic.  Mouth/Throat: Oropharynx is clear and moist.  Eyes: Pupils are equal, round, and reactive to light. Conjunctivae and EOM are normal.  Neck: Normal range of motion. Neck supple.  Cardiovascular: Normal rate, regular rhythm and normal heart sounds.  Bilateral 1+ lower extremity edema  Pulmonary/Chest: Effort normal and breath sounds normal. No respiratory distress.  Abdominal: Soft. He exhibits no distension. There is no tenderness.  Musculoskeletal: Normal range of motion. He exhibits no edema or deformity.  Neurological: He is alert and oriented to person, place, and time.  Skin: Skin is warm and dry.  Psychiatric: He has a normal mood and affect.  Nursing note and vitals reviewed.    ED Treatments / Results  Labs (all labs ordered are listed, but only abnormal results are displayed) Labs Reviewed  COMPREHENSIVE METABOLIC PANEL - Abnormal; Notable for the following components:      Result Value   Chloride 97 (*)    Glucose, Bld 126 (*)    BUN 43 (*)    Creatinine, Ser 1.99 (*)    Total Protein 5.1 (*)    Albumin 2.6 (*)    GFR calc non Af Amer 28 (*)    GFR calc Af Amer 33 (*)    All other components within normal limits  CBC WITH  DIFFERENTIAL/PLATELET - Abnormal; Notable for the following components:   WBC 18.0 (*)    RBC 3.14 (*)    Hemoglobin 9.0 (*)    HCT 29.1 (*)    Neutro Abs 15.3 (*)    Monocytes Absolute 1.2 (*)    Abs Immature Granulocytes 0.30 (*)    All other components within normal limits  PROTIME-INR - Abnormal; Notable for the following components:   Prothrombin Time 32.7 (*)    All other components within normal limits  URINALYSIS, ROUTINE W REFLEX MICROSCOPIC - Abnormal; Notable for the following components:   APPearance TURBID (*)    Hgb urine dipstick MODERATE (*)    Protein, ur >=300 (*)    Leukocytes, UA MODERATE (*)    WBC, UA >50 (*)    Bacteria, UA MANY (*)    All other components within normal limits  I-STAT CG4 LACTIC ACID, ED - Abnormal; Notable for the following components:   Lactic Acid, Venous 3.20 (*)    All other components within normal limits  I-STAT CHEM 8, ED - Abnormal; Notable for the following components:   Sodium 133 (*)    Chloride 94 (*)    BUN 46 (*)    Creatinine, Ser 1.80 (*)    Glucose, Bld 115 (*)    Hemoglobin 8.8 (*)    HCT 26.0 (*)    All other components within normal limits  CBG MONITORING, ED - Abnormal; Notable for the following components:   Glucose-Capillary 117 (*)    All other components within normal limits  CULTURE, BLOOD (ROUTINE X 2)  CULTURE, BLOOD (ROUTINE X 2)  I-STAT TROPONIN, ED  I-STAT CG4 LACTIC ACID, ED    EKG EKG Interpretation  Date/Time:  Wednesday October 23 2017 10:58:58 EDT Ventricular Rate:  88 PR Interval:    QRS Duration: 83 QT Interval:  341 QTC Calculation: 413 R Axis:   90 Text Interpretation:  Atrial fibrillation  Borderline right axis deviation Low voltage, extremity leads Minimal ST depression, lateral leads Confirmed by Dene Gentry [id number]) on 10/23/2017 11:01:27 AM   Radiology Dg Chest Port 1 View  Result Date: 10/23/2017 CLINICAL DATA:  Weakness in a patient with a history of COPD and lung  carcinoma. EXAM: PORTABLE CHEST 1 VIEW COMPARISON:  CT chest 08/14/2017. FINDINGS: The lungs are emphysematous. There is some volume loss in the left chest and scar in the left lung apex consistent with prior surgery for lung cancer. No consolidative process or pneumothorax is identified. Mild left basilar atelectasis is noted. Heart size is upper normal. Aortic atherosclerosis is seen. IMPRESSION: No acute disease. Emphysema. Postoperative change left chest. Atherosclerosis. Electronically Signed   By: Inge Rise M.D.   On: 10/23/2017 12:16    Procedures Procedures (including critical care time) CRITICAL CARE Performed by: Valarie Merino   Total critical care time: 30 minutes  Critical care time was exclusive of separately billable procedures and treating other patients.  Critical care was necessary to treat or prevent imminent or life-threatening deterioration.  Critical care was time spent personally by me on the following activities: development of treatment plan with patient and/or surrogate as well as nursing, discussions with consultants, evaluation of patient's response to treatment, examination of patient, obtaining history from patient or surrogate, ordering and performing treatments and interventions, ordering and review of laboratory studies, ordering and review of radiographic studies, pulse oximetry and re-evaluation of patient's condition.   Medications Ordered in ED Medications  sodium chloride 0.9 % bolus 1,000 mL (1,000 mLs Intravenous New Bag/Given 10/23/17 1223)  piperacillin-tazobactam (ZOSYN) IVPB 3.375 g (0 g Intravenous Stopped 10/23/17 1200)  sodium chloride 0.9 % bolus 1,000 mL (0 mLs Intravenous Stopped 10/23/17 1222)  vancomycin (VANCOCIN) IVPB 1000 mg/200 mL premix (1,000 mg Intravenous New Bag/Given 10/23/17 1152)     Initial Impression / Assessment and Plan / ED Course  I have reviewed the triage vital signs and the nursing notes.  Pertinent labs &  imaging results that were available during my care of the patient were reviewed by me and considered in my medical decision making (see chart for details).     MDM  Screen complete  Patient is presenting with hypotension and fever.  Likely source of his infection is urine.  Broad-spectrum antibiotics given the ED.  Patient given IV fluids in the ED.  After approximately 3 L of IVF given in the ED blood pressure remains in the 70s over 40s.  Patient has expressed that he is a DNR.  His wife confirms this.  Plan of care discussed extensively with the patient and his wife.  He is not very excited about placement of a central line for possible vasopressor administration. I was able to get him to consent (at least initially) to placement of a TLC in the groin.  Case was discussed with critical care services Elsworth Soho).  Dr. Elsworth Soho will evaluate the patient and determine whether the patient may benefit from vasopressors administered peripherally.  He does not feel that the patient absolutely needs a central line placed at this time. Therefore, plan to place TLC placed on hold.    Family (wife) aware of plan of care and need for admission.     Final Clinical Impressions(s) / ED Diagnoses   Final diagnoses:  Hypotension, unspecified hypotension type  Fever, unspecified fever cause  Urinary tract infection without hematuria, site unspecified    ED Discharge Orders    None  Valarie Merino, MD 10/23/17 1329

## 2017-10-23 NOTE — ED Notes (Signed)
Ed provider [place] notified patient has a critical lactic acid value of 3.20

## 2017-10-23 NOTE — H&P (Signed)
Name: Benjamin Patel MRN: [id number] DOB: February 04, 1927    ADMISSION DATE:  10/23/2017 CONSULTATION DATE:  10/23/2017  REFERRING MD :  EDP, Messick  CHIEF COMPLAINT:  gen weakness  BRIEF PATIENT DESCRIPTION: 84 year old man with atrial fibrillation and frequent UTIs admitted with general weakness found to be in septic shock with frank pus in urine  SIGNIFICANT EVENTS    STUDIES:     HISTORY OF PRESENT ILLNESS: 84 year old man with a history of chronic atrial fibrillation arrived via EMS to the ED complaining of generalized weakness.  He has been suffering from recurrent UTIs since February and has been on multiple antibiotics.  Most recently was on doxycycline.  He was evaluated by his PCP Dr. Shelia Media 10/8, blood pressure was 120/70, he was complaining of diarrhea for the last few days and specimen was sent for C. Difficile.  I note from his recent cardiac evaluation for atrial fibrillation by Dr. Marlou Porch, he has been maintained on Cardizem and metoprolol for rate control and on Eliquis for anticoagulation.  He also follows with vascular for noncritical carotid stenosis .  He has also seen urology Dr. Karsten Ro  He was noted to be hypotensive by EMS, received 800 cc of normal saline in route, was febrile in the ED, hypotensive and received 3 mL, initial lactate was 3.2.  After fluid bolus he continued to remain hypotensive, hence PCCM consulted  PAST MEDICAL HISTORY :   has a past medical history of Abnormal prostate exam, Arthritis, Asthma, Carotid stenosis, Carotid stenosis (right side), Cholelithiasis, COPD (chronic obstructive pulmonary disease) ([place]), Enlarged prostate, Essential hypertension, benign, Gout, History of bronchitis, History of colon polyps, History of shingles, Lung cancer ([place]) (dx'd 2014), Mixed hyperlipidemia, PAC (premature atrial contraction), Pneumonia (02/08/2012), PVCs (premature ventricular contractions), Radiation (12/13/14-12/24/14), Renal artery stenosis ([place]), Renal  insufficiency, Skin cancer, and Type II or unspecified type diabetes mellitus without mention of complication, not stated as uncontrolled.  has a past surgical history that includes No past surgeries; Colonoscopy; Video assisted thoracoscopy (Left, 06/16/2012); and Segmentectomy (Left, 06/16/2012). Prior to Admission medications   Medication Sig Start Date End Date Taking? Authorizing Provider  allopurinol (ZYLOPRIM) 100 MG tablet Take 100 mg by mouth 2 (two) times daily.  05/16/16  Yes [provider]  apixaban (ELIQUIS) 5 MG TABS tablet Take 1 tablet (5 mg total) by mouth 2 (two) times daily. 06/18/17  Yes Jerline Pain, MD  cholecalciferol (VITAMIN D) 1000 units tablet Take 1,000 Units by mouth 2 (two) times daily.    Yes [provider]  diltiazem (CARDIZEM CD) 180 MG 24 hr capsule Take 1 capsule (180 mg total) by mouth daily. 09/09/17 12/08/17 Yes Jerline Pain, MD  doxycycline (VIBRA-TABS) 100 MG tablet Take 100 mg by mouth every 12 (twelve) hours. 10/17/17  Yes [provider]  gabapentin (NEURONTIN) 400 MG capsule Take 400 mg by mouth daily.   Yes [provider]  metoprolol succinate (TOPROL-XL) 100 MG 24 hr tablet Take 1 tablet (100 mg total) by mouth daily. Take with or immediately following a meal. 08/17/14  Yes Daub, Loura Back, MD  mirabegron ER (MYRBETRIQ) 25 MG TB24 tablet Take 25 mg by mouth daily.   Yes [provider]  Probiotic Product (PROBIOTIC PO) Take 1 tablet by mouth daily.   Yes [provider]  tamsulosin (FLOMAX) 0.4 MG CAPS capsule Take 0.4 mg by mouth daily. 08/10/17  Yes [provider]   Allergies  Allergen Reactions  . Ace Inhibitors Cough  .  Codeine Other (See Comments)    GI Upset  . Indomethacin Other (See Comments)    Elevated BP  . Losartan Swelling    Angioedema    FAMILY HISTORY:  family history includes Cancer in his father; Diabetes in his mother; Heart disease in his mother; Hypertension in his  mother; Kidney disease in his mother; Varicose Veins in his mother. SOCIAL HISTORY:  reports that he quit smoking about 41 years ago. His smoking use included cigarettes. He has a 45.00 pack-year smoking history. He has never used smokeless tobacco. He reports that he does not drink alcohol or use drugs.  REVIEW OF SYSTEMS:  Complains of generalized weakness and fever, frequent urinary tract infections Positive for loose stools   Constitutional: Negative for  chills, weight loss and diaphoresis.  HENT: Negative for hearing loss, ear pain, nosebleeds, congestion, sore throat, neck pain, tinnitus and ear discharge.   Eyes: Negative for blurred vision, double vision, photophobia, pain, discharge and redness.  Respiratory: Negative for cough, hemoptysis, sputum production, shortness of breath, wheezing and stridor.   Cardiovascular: Negative for chest pain, palpitations, orthopnea, claudication, leg swelling and PND.  Gastrointestinal: Negative for heartburn, nausea, vomiting, abdominal pain,  constipation, blood in stool and melena.  Genitourinary: Negative forhematuria and flank pain.  Musculoskeletal: Negative for myalgias, back pain, joint pain and falls.  Skin: Negative for itching and rash.  Neurological: Negative for dizziness, tingling, tremors, sensory change, speech change, focal weakness, seizures, loss of consciousness, weakness and headaches.  Endo/Heme/Allergies: Negative for environmental allergies and polydipsia. Does not bruise/bleed easily.  SUBJECTIVE:   VITAL SIGNS: Temp:  [100.3 F (37.9 C)-101.6 F (38.7 C)] 100.3 F (37.9 C) (10/09 1131) Pulse Rate:  [72-86] 79 (10/09 1335) Resp:  [12-33] 14 (10/09 1335) BP: (70-86)/(37-49) 78/48 (10/09 1335) SpO2:  [86 %-98 %] 97 % (10/09 1335) Weight:  [67.1 kg] 67.1 kg (10/09 1141)  PHYSICAL EXAMINATION: Gen. Pleasant, elderly, well-nourished, in no distress, normal affect ENT - no pallor, icterus Neck: No JVD, no  thyromegaly, no carotid bruits Lungs: no use of accessory muscles, no dullness to percussion, clear without rales or rhonchi  Cardiovascular: Rhythm regular, heart sounds  normal, no murmurs or gallops, 2+ peripheral edema Abdomen: soft and non-tender, no hepatosplenomegaly, BS normal. Musculoskeletal: No deformities, no cyanosis or clubbing Neuro:  alert, non focal   Recent Labs  Lab 10/23/17 1121 10/23/17 1126  NA 136 133*  K 4.6 4.6  CL 97* 94*  CO2 30  --   BUN 43* 46*  CREATININE 1.99* 1.80*  GLUCOSE 126* 115*   Recent Labs  Lab 10/23/17 1121 10/23/17 1126  HGB 9.0* 8.8*  HCT 29.1* 26.0*  WBC 18.0*  --   PLT 184  --    Dg Chest Port 1 View  Result Date: 10/23/2017 CLINICAL DATA:  Weakness in a patient with a history of COPD and lung carcinoma. EXAM: PORTABLE CHEST 1 VIEW COMPARISON:  CT chest 08/14/2017. FINDINGS: The lungs are emphysematous. There is some volume loss in the left chest and scar in the left lung apex consistent with prior surgery for lung cancer. No consolidative process or pneumothorax is identified. Mild left basilar atelectasis is noted. Heart size is upper normal. Aortic atherosclerosis is seen. IMPRESSION: No acute disease. Emphysema. Postoperative change left chest. Atherosclerosis. Electronically Signed   By: Inge Rise M.D.   On: 10/23/2017 12:16    ASSESSMENT / PLAN:  Septic shock related to UTI-blood pressure in the low 80s after 3  L fluid bolus in the ED, will start peripheral Neo-Synephrine, hopefully he will only need a low dose to maintain systolic pressure of 90 or MAP of 65 Repeat lactate for clearance He is at high risk to have resistant organisms, I note enterococcus in 2015. We will keep him on vancomycin and Zosyn at the current time but have low threshold to broaden to meropenem to cover ESBL organisms Since he also has diarrhea, reasonable to rule out C. difficile, he has been on multiple antibiotics recently  AKI -related to  above, gentle hydration with normal saline at 50/hour he does seem to have peripheral edema and hence will restrict fluids as soon as blood pressure improves  Chronic atrial fibrillation-we will hold Eliquis in the setting of renal insufficiency, hold metoprolol and Cardizem until blood pressure improves.  Advanced directives clarified, he is very clear on DNR status   Kara Mead MD. FCCP. [facility] Pager [phone] If no response call [phone]   10/23/2017   10/23/2017, 2:10 PM

## 2017-10-23 NOTE — ED Notes (Addendum)
Provider notified of altered mentation. Fluid bolus administered per standing orders, provider advised to take bolus off of pressure bag because "patient is mentating fine" per MD. BP stays at 70s/40s, MD aware.

## 2017-10-23 NOTE — Progress Notes (Signed)
A consult was received from an ED physician for vancomycin per pharmacy dosing (for an indication other than meningitis). The patient's profile has been reviewed for ht/wt/allergies/indication/available labs. A one time order has been placed for the above antibiotics.  Further antibiotics/pharmacy consults should be ordered by admitting physician if indicated.                       Reuel Boom, PharmD, BCPS [phone] 10/23/2017, 11:43 AM

## 2017-10-23 NOTE — ED Notes (Signed)
O2 dropped to 88 on room air. RN has been notified. Placed on nasal cannula

## 2017-10-23 NOTE — ED Notes (Signed)
ED TO INPATIENT HANDOFF REPORT  Name/Age/Gender Benjamin Patel 84 y.o. male  Code Status    Code Status Orders  (From admission, onward)         Start     Ordered   10/23/17 1407  Do not attempt resuscitation (DNR)  Continuous    Question Answer Comment  In the event of cardiac or respiratory ARREST Do not call a "code blue"   In the event of cardiac or respiratory ARREST Do not perform Intubation, CPR, defibrillation or ACLS   In the event of cardiac or respiratory ARREST Use medication by any route, position, wound care, and other measures to relive pain and suffering. May use oxygen, suction and manual treatment of airway obstruction as needed for comfort.      10/23/17 1409        Code Status History    Date Active Date Inactive Code Status Order ID Comments User Context   06/16/2012 1440 06/23/2012 1422 Full Code [id number]  Nani Skillern, PA-C Inpatient    Advance Directive Documentation     Most Recent Value  Type of Advance Directive  Living will  Pre-existing out of facility DNR order (yellow form or pink MOST form)  -  "MOST" Form in Place?  -      Home/SNF/Other Home  Chief Complaint weak,fever  Level of Care/Admitting Diagnosis ED Disposition    ED Disposition Condition [place]: [facility] [100102]  Level of Care: ICU [6]  Diagnosis: Septic shock [facility]) [[id number]]  Admitting Physician: ALVA, [place]  Attending Physician: Rigoberto Noel [[id number]]  Estimated length of stay: past midnight tomorrow  Certification:: I certify this patient will need inpatient services for at least 2 midnights  PT Class (Do Not Modify): Inpatient [101]  PT Acc Code (Do Not Modify): Private [1]       Medical History Past Medical History:  Diagnosis Date  . Abnormal prostate exam   . Arthritis    Gout   . Asthma    as a child  . Carotid stenosis   . Carotid stenosis right side   Dr.Early does carotid ultrasounds  yrly-last one Mar 14-report in epic  . Cholelithiasis   . COPD (chronic obstructive pulmonary disease) (HCC)    slight   . Enlarged prostate    slightly   . Essential hypertension, benign    takes Clonodine,Losartan,and Metoprolol and HCTZ  . Gout    no meds required  . History of bronchitis   . History of colon polyps   . History of shingles   . Lung cancer ([place]) dx'd 2014   LLL resection; xrt  . Mixed hyperlipidemia    takes Pravastatin daily  . PAC (premature atrial contraction)   . Pneumonia 02/08/2012  . PVCs (premature ventricular contractions)   . Radiation 12/13/14-12/24/14   left upper lobe nodule 50 gray  . Renal artery stenosis (HCC)    right  . Renal insufficiency   . Skin cancer    Lung Ca- Left Lung  . Type II or unspecified type diabetes mellitus without mention of complication, not stated as uncontrolled    02/14/2016 diet controlled    Allergies Allergies  Allergen Reactions  . Ace Inhibitors Cough  . Codeine Other (See Comments)    GI Upset  . Indomethacin Other (See Comments)    Elevated BP  . Losartan Swelling    Angioedema    IV Location/Drains/Wounds Patient  Lines/Drains/Airways Status   Active Line/Drains/Airways    Name:   Placement date:   Placement time:   Site:   Days:   Peripheral IV 10/23/17 Left Antecubital   10/23/17    1117    Antecubital   less than 1   Peripheral IV 10/23/17 Right Forearm   10/23/17    1153    Forearm   less than 1   Urethral Catheter [place], RN 16 Fr.   10/23/17    1138    -   less than 1          Labs/Imaging Results for orders placed or performed during the hospital encounter of 10/23/17 (from the past 48 hour(s))  Comprehensive metabolic panel     Status: Abnormal   Collection Time: 10/23/17 11:21 AM  Result Value Ref Range   Sodium 136 135 - 145 mmol/L   Potassium 4.6 3.5 - 5.1 mmol/L   Chloride 97 (L) 98 - 111 mmol/L   CO2 30 22 - 32 mmol/L   Glucose, Bld 126 (H) 70 - 99 mg/dL   BUN 43 (H) 8 - 23  mg/dL   Creatinine, Ser 1.99 (H) 0.61 - 1.24 mg/dL   Calcium 9.0 8.9 - 10.3 mg/dL   Total Protein 5.1 (L) 6.5 - 8.1 g/dL   Albumin 2.6 (L) 3.5 - 5.0 g/dL   AST 26 15 - 41 U/L   ALT 15 0 - 44 U/L   Alkaline Phosphatase 53 38 - 126 U/L   Total Bilirubin 0.9 0.3 - 1.2 mg/dL   GFR calc non Af Amer 28 (L) >60 mL/min   GFR calc Af Amer 33 (L) >60 mL/min    Comment: (NOTE) The eGFR has been calculated using the CKD EPI equation. This calculation has not been validated in all clinical situations. eGFR's persistently <60 mL/min signify possible Chronic Kidney Disease.    Anion gap 9 5 - 15    Comment: Performed at [facility], [place] [address] [place], [place] [address]  I-Stat CG4 Lactic Acid, ED     Status: Abnormal   Collection Time: 10/23/17 11:21 AM  Result Value Ref Range   Lactic Acid, Venous 3.20 (HH) 0.5 - 1.9 mmol/L   Comment NOTIFIED PHYSICIAN   CBC with Differential     Status: Abnormal   Collection Time: 10/23/17 11:21 AM  Result Value Ref Range   WBC 18.0 (H) 4.0 - 10.5 K/uL   RBC 3.14 (L) 4.22 - 5.81 MIL/uL   Hemoglobin 9.0 (L) 13.0 - 17.0 g/dL   HCT 29.1 (L) 39.0 - 52.0 %   MCV 92.7 80.0 - 100.0 fL   MCH 28.7 26.0 - 34.0 pg   MCHC 30.9 30.0 - 36.0 g/dL   RDW 15.5 11.5 - 15.5 %   Platelets 184 150 - 400 K/uL   nRBC 0.0 0.0 - 0.2 %   Neutrophils Relative % 85 %   Neutro Abs 15.3 (H) 1.7 - 7.7 K/uL   Lymphocytes Relative 7 %   Lymphs Abs 1.2 0.7 - 4.0 K/uL   Monocytes Relative 6 %   Monocytes Absolute 1.2 (H) 0.1 - 1.0 K/uL   Eosinophils Relative 0 %   Eosinophils Absolute 0.0 0.0 - 0.5 K/uL   Basophils Relative 0 %   Basophils Absolute 0.1 0.0 - 0.1 K/uL   WBC Morphology MORPHOLOGY UNREMARKABLE    Immature Granulocytes 2 %   Abs Immature Granulocytes 0.30 (H) 0.00 - 0.07 K/uL  Comment: Performed at [facility], [place] [address], [place], [place] [address]  Protime-INR     Status: Abnormal   Collection Time: 10/23/17 11:21  AM  Result Value Ref Range   Prothrombin Time 32.7 (H) 11.4 - 15.2 seconds   INR 3.22     Comment: Performed at [facility], [place] [address], [place], [place] [address]  Urinalysis, Routine w reflex microscopic     Status: Abnormal   Collection Time: 10/23/17 11:21 AM  Result Value Ref Range   Color, Urine YELLOW YELLOW   APPearance TURBID (A) CLEAR   Specific Gravity, Urine 1.013 1.005 - 1.030   pH 7.0 5.0 - 8.0   Glucose, UA NEGATIVE NEGATIVE mg/dL   Hgb urine dipstick MODERATE (A) NEGATIVE   Bilirubin Urine NEGATIVE NEGATIVE   Ketones, ur NEGATIVE NEGATIVE mg/dL   Protein, ur >=300 (A) NEGATIVE mg/dL   Nitrite NEGATIVE NEGATIVE   Leukocytes, UA MODERATE (A) NEGATIVE   RBC / HPF 21-50 0 - 5 RBC/hpf   WBC, UA >50 (H) 0 - 5 WBC/hpf   Bacteria, UA MANY (A) NONE SEEN   Squamous Epithelial / LPF 0-5 0 - 5   Mucus PRESENT     Comment: Performed at [facility], [place] [address] [place],  [address]  I-stat troponin, ED     Status: None   Collection Time: 10/23/17 11:24 AM  Result Value Ref Range   Troponin i, poc 0.00 0.00 - 0.08 ng/mL   Comment 3            Comment: Due to the release kinetics of cTnI, a negative result within the first hours of the onset of symptoms does not rule out myocardial infarction with certainty. If myocardial infarction is still suspected, repeat the test at appropriate intervals.   I-stat Chem 8, ED     Status: Abnormal   Collection Time: 10/23/17 11:26 AM  Result Value Ref Range   Sodium 133 (L) 135 - 145 mmol/L   Potassium 4.6 3.5 - 5.1 mmol/L   Chloride 94 (L) 98 - 111 mmol/L   BUN 46 (H) 8 - 23 mg/dL   Creatinine, Ser 1.80 (H) 0.61 - 1.24 mg/dL   Glucose, Bld 115 (H) 70 - 99 mg/dL   Calcium, Ion 1.25 1.15 - 1.40 mmol/L   TCO2 30 22 - 32 mmol/L   Hemoglobin 8.8 (L) 13.0 - 17.0 g/dL   HCT 26.0 (L) 39.0 - 52.0 %  CBG monitoring, ED     Status: Abnormal   Collection Time: 10/23/17 11:26 AM  Result  Value Ref Range   Glucose-Capillary 117 (H) 70 - 99 mg/dL   Dg Chest Port 1 View  Result Date: 10/23/2017 CLINICAL DATA:  Weakness in a patient with a history of COPD and lung carcinoma. EXAM: PORTABLE CHEST 1 VIEW COMPARISON:  CT chest 08/14/2017. FINDINGS: The lungs are emphysematous. There is some volume loss in the left chest and scar in the left lung apex consistent with prior surgery for lung cancer. No consolidative process or pneumothorax is identified. Mild left basilar atelectasis is noted. Heart size is upper normal. Aortic atherosclerosis is seen. IMPRESSION: No acute disease. Emphysema. Postoperative change left chest. Atherosclerosis. Electronically Signed   By: Inge Rise M.D.   On: 10/23/2017 12:16    Pending Labs Unresulted Labs (From admission, onward)    Start     Ordered   10/24/17 0500  CBC  Tomorrow morning,  R     10/23/17 1409   10/24/17 3710  Basic metabolic panel  Tomorrow morning,   R     10/23/17 1409   10/24/17 0500  Magnesium  Tomorrow morning,   R     10/23/17 1409   10/24/17 0500  Phosphorus  Tomorrow morning,   R     10/23/17 1409   10/23/17 1410  C difficile quick scan w PCR reflex  (C Difficile quick screen w PCR reflex panel)  Once, for 24 hours,   R     10/23/17 1409   10/23/17 1409  Urine culture  Once,   R     10/23/17 1409   10/23/17 1117  Culture, blood (Routine x 2)  BLOOD CULTURE X 2,   STAT     10/23/17 1116          Vitals/Pain Today's Vitals   10/23/17 1400 10/23/17 1415 10/23/17 1430 10/23/17 1445  BP: (!) 84/47 (!) 85/45 (!) 84/42 (!) 83/43  Pulse: 76 72 74 74  Resp: _0 Temp:      TempSrc:      SpO2: 98% 98% 99% 99%  Weight:      Height:      PainSc:        Isolation Precautions Enteric precautions (UV disinfection)  Medications Medications  0.9 %  sodium chloride infusion (has no administration in time range)  0.9 %  sodium chloride infusion (has no administration in time range)  phenylephrine  (NEOSYNEPHRINE) 10-0.9 MG/250ML-% infusion (has no administration in time range)  acetaminophen (TYLENOL) tablet 650 mg (has no administration in time range)  piperacillin-tazobactam (ZOSYN) IVPB 3.375 g (0 g Intravenous Stopped 10/23/17 1200)  sodium chloride 0.9 % bolus 1,000 mL (0 mLs Intravenous Stopped 10/23/17 1222)  vancomycin (VANCOCIN) IVPB 1000 mg/200 mL premix (1,000 mg Intravenous New Bag/Given 10/23/17 1152)  sodium chloride 0.9 % bolus 1,000 mL (1,000 mLs Intravenous New Bag/Given 10/23/17 1223)    Mobility walks with person assist

## 2017-10-23 NOTE — ED Notes (Addendum)
Attempted to give report to [place], [place]. She was off the floor. Attempted to give report to charge nurse, Erasmo Downer and she was occupied. Will try again later.

## 2017-10-23 NOTE — ED Notes (Signed)
Bed: WA17 Expected date:  Expected time:  Means of arrival:  Comments: EMS/84 y o/weak fever

## 2017-10-23 NOTE — ED Notes (Signed)
Central line kit at bedside

## 2017-10-24 DIAGNOSIS — N179 Acute kidney failure, unspecified: Secondary | ICD-10-CM

## 2017-10-24 DIAGNOSIS — I4821 Permanent atrial fibrillation: Secondary | ICD-10-CM

## 2017-10-24 DIAGNOSIS — R197 Diarrhea, unspecified: Secondary | ICD-10-CM

## 2017-10-24 LAB — PHOSPHORUS: Phosphorus: 3.4 mg/dL (ref 2.5–4.6)

## 2017-10-24 LAB — BASIC METABOLIC PANEL
Anion gap: 9 (ref 5–15)
BUN: 37 mg/dL — AB (ref 8–23)
CALCIUM: 8.6 mg/dL — AB (ref 8.9–10.3)
CO2: 27 mmol/L (ref 22–32)
CREATININE: 1.56 mg/dL — AB (ref 0.61–1.24)
Chloride: 104 mmol/L (ref 98–111)
GFR, EST AFRICAN AMERICAN: 44 mL/min — AB (ref >60)
GFR, EST NON AFRICAN AMERICAN: 38 mL/min — AB (ref >60)
Glucose, Bld: 100 mg/dL — ABNORMAL HIGH (ref 70–99)
Potassium: 4 mmol/L (ref 3.5–5.1)
SODIUM: 140 mmol/L (ref 135–145)

## 2017-10-24 LAB — CBC
HCT: 33.5 % — ABNORMAL LOW (ref 39.0–52.0)
HEMOGLOBIN: 10 g/dL — AB (ref 13.0–17.0)
MCH: 28.1 pg (ref 26.0–34.0)
MCHC: 29.9 g/dL — AB (ref 30.0–36.0)
MCV: 94.1 fL (ref 80.0–100.0)
PLATELETS: 223 10*3/uL (ref 150–400)
RBC: 3.56 MIL/uL — AB (ref 4.22–5.81)
RDW: 15.7 % — ABNORMAL HIGH (ref 11.5–15.5)
WBC: 24.3 10*3/uL — AB (ref 4.0–10.5)
nRBC: 0 % (ref 0.0–0.2)

## 2017-10-24 LAB — MAGNESIUM: Magnesium: 1.6 mg/dL — ABNORMAL LOW (ref 1.7–2.4)

## 2017-10-24 MED ORDER — MAGNESIUM SULFATE 2 GM/50ML IV SOLN
2.0000 g | Freq: Once | INTRAVENOUS | Status: AC
  Administered 2017-10-24: 2 g via INTRAVENOUS
  Filled 2017-10-24: qty 50

## 2017-10-24 MED ORDER — APIXABAN 5 MG PO TABS
5.0000 mg | ORAL_TABLET | Freq: Two times a day (BID) | ORAL | Status: DC
  Administered 2017-10-24 – 2017-10-27 (×7): 5 mg via ORAL
  Filled 2017-10-24 (×8): qty 1

## 2017-10-24 MED ORDER — DILTIAZEM HCL 60 MG PO TABS
60.0000 mg | ORAL_TABLET | Freq: Three times a day (TID) | ORAL | Status: DC
  Administered 2017-10-24 – 2017-10-29 (×14): 60 mg via ORAL
  Filled 2017-10-24 (×14): qty 1

## 2017-10-24 MED ORDER — METOPROLOL TARTRATE 5 MG/5ML IV SOLN: 5.0000 mg | INTRAVENOUS | Status: DC | PRN

## 2017-10-24 MED ORDER — METOPROLOL TARTRATE 25 MG PO TABS
25.0000 mg | ORAL_TABLET | Freq: Two times a day (BID) | ORAL | Status: DC
  Administered 2017-10-24 – 2017-10-25 (×3): 25 mg via ORAL
  Filled 2017-10-24 (×3): qty 1

## 2017-10-24 MED ORDER — LACTATED RINGERS IV SOLN
INTRAVENOUS | Status: DC
  Administered 2017-10-24: 13:00:00 via INTRAVENOUS

## 2017-10-24 NOTE — Progress Notes (Signed)
NAME:  Benjamin Patel, MRN:  [id number], DOB:  1927-12-18, LOS: 1 ADMISSION DATE:  10/23/2017, CONSULTATION DATE: 10/9 REFERRING MD: Emergency room physician, CHIEF COMPLAINT: Optic shock  Brief History   84 year old male with frequent urinary tract infections admitted on 10/9 with sepsis in setting of urinary tract infection  Past Medical History  Atrial fibrillation on Eliquis and rate control, enlarged prostate, COPD, type 2 diabetes Significant Hospital Events   10/9: Admitted with septic shock in setting of urinary tract infection, complicated by acute kidney injury placed on peripheral Neo-Synephrine and IV hydration.  Antibiotics initiated 10/10: Weaning Neo-Synephrine denies pain or shortness of breath culture still pending  Consults: date of consult/date signed off & final recs:    Procedures (surgical and bedside):    Significant Diagnostic Tests:  Renal ultrasound on 10/9:No active renal findings bladder was decompressed  Micro Data:  10/9: Blood cultures x2 Urine culture 10/9 C. difficile 10/9  Antimicrobials:  Meropenem 10/9 Vancomycin 10/9; stopped 10/10   Subjective:  Feels better  Objective   Blood pressure 116/88, pulse (Abnormal) 122, temperature 97.6 F (36.4 C), temperature source Oral, resp. rate (Abnormal) 22, height 5\' 8"  (1.727 m), weight 75.9 kg, SpO2 98 %.        Intake/Output Summary (Last 24 hours) at 10/24/2017 1003 Last data filed at 10/24/2017 0814 Gross per 24 hour  Intake 4726.48 ml  Output 2250 ml  Net 2476.48 ml   Filed Weights   10/23/17 1141 10/23/17 1548 10/24/17 0500  Weight: 67.1 kg 73.7 kg 75.9 kg    Examination: General: Frail 84 year old white male currently resting in bed no acute distress he is very hard of hearing  hENT: Normocephalic atraumatic no jugular venous distention Lungs: Clear to auscultation Cardiovascular: Tachycardic irregular irregular atrial fibrillation on telemetry Abdomen: Soft nontender no  organomegaly Extremities: Lower extremity edema brisk capillary refill strong pulses Neuro: Awake oriented no focal deficits GU: Cloudy yellow discolored urine with brown discolored sediment  Resolved Hospital Problem list     Assessment & Plan:  Severe sepsis/septic shock secondary to urinary tract source -Has had several antibiotic exposures -Hemodynamically improving, weaning pressors Plan Continue gentle IV hydration, he still looks volume depleted Day #2 antibiotics, will continue meropenem but discontinue vancomycin Continue to wean Neo-Synephrine for mean arterial goal greater than 65 Await current pending culture data  Diarrhea.   This was noted outpatient, he has had no further bowel movement since admission.  I am doubtful this is C. difficile  Plan  Follow-up C. difficile pending  history of atrial fibrillation with rapid ventricular response currently Plan Continue telemetry monitoring Holding metoprolol and Cardizem until blood pressure improves Okay to continue Eliquis Gentle IV hydration  Acute on chronic renal failure Appears as though baseline creatinine around 1.4-1.5.  He peaked at 1.99 following admission.  Now trending back down following IV hydration and fluid resuscitation. Plan Continuing IV hydration Renal dose medications Strict intake output Follow-up chemistry Hold antihypertensives  Fluid and electrolyte balance: Hypo-magnesemia  Plan Replace and recheck  Anemia without evidence of bleeding Plan Trend CBC  Peripheral edema, chronic. Plan Compression stockings  Diabetes, diet controlled Plan Carb modified diet  Disposition / Summary of Today's Plan 10/24/17   Continuing IV hydration, continue antibiotics, await culture data.  Suspect he will be off pressors later today    Diet: Carb modified diet Pain/Anxiety/Delirium protocol (if indicated): Not indicated VAP protocol (if indicated): Not indicated DVT prophylaxis: Eliquis GI  prophylaxis: Not indicated Hyperglycemia  protocol: Not indicated Mobility: Out of bed Code Status: Full code Family Communication: Wife updated extensively at bedside  Labs   CBC: Recent Labs  Lab 10/23/17 1121 10/23/17 1126 10/24/17 0753  WBC 18.0*  --  24.3*  NEUTROABS 15.3*  --   --   HGB 9.0* 8.8* 10.0*  HCT 29.1* 26.0* 33.5*  MCV 92.7  --  94.1  PLT 184  --  329    Basic Metabolic Panel: Recent Labs  Lab 10/23/17 1121 10/23/17 1126 10/24/17 0753  NA 136 133* 140  K 4.6 4.6 4.0  CL 97* 94* 104  CO2 30  --  27  GLUCOSE 126* 115* 100*  BUN 43* 46* 37*  CREATININE 1.99* 1.80* 1.56*  CALCIUM 9.0  --  8.6*  MG  --   --  1.6*  PHOS  --   --  3.4   GFR: Estimated Creatinine Clearance: 31.7 mL/min (A) (by C-G formula based on SCr of 1.56 mg/dL (H)). Recent Labs  Lab 10/23/17 1121 10/24/17 0753  WBC 18.0* 24.3*  LATICACIDVEN 3.20*  --     Liver Function Tests: Recent Labs  Lab 10/23/17 1121  AST 26  ALT 15  ALKPHOS 53  BILITOT 0.9  PROT 5.1*  ALBUMIN 2.6*   No results for input(s): LIPASE, AMYLASE in the last 168 hours. No results for input(s): AMMONIA in the last 168 hours.  ABG    Component Value Date/Time   PHART 7.381 06/17/2012 0448   PCO2ART 43.0 06/17/2012 0448   PO2ART 68.0 (L) 06/17/2012 0448   HCO3 25.6 (H) 06/17/2012 0448   TCO2 30 10/23/2017 1126   O2SAT 93.0 06/17/2012 0448     Coagulation Profile: Recent Labs  Lab 10/23/17 1121  INR 3.22    Cardiac Enzymes: No results for input(s): CKTOTAL, CKMB, CKMBINDEX, TROPONINI in the last 168 hours.  HbA1C: Hemoglobin A1C  Date/Time Value Ref Range Status  04/21/2015 10:15 AM 6.7  Final  12/23/2014 09:30 AM 6.4  Final    CBG: Recent Labs  Lab 10/23/17 [place] ACNP-BC [place] Pager # [phone] OR # [phone] if no answer

## 2017-10-24 NOTE — Care Management Note (Signed)
Case Management Note  Patient Details  Name: Benjamin Patel MRN: [id number] Date of Birth: 31-Aug-1927  Subjective/Objective:                  Sepsis,temp-101.6, iv ns, o2 via [place], iv merrem, iv neo drip, iv vancomycin, bun-37, creatr. 1.56, wbc 24.3  Action/Plan: Following for progression and cm needs.  Expected Discharge Date:  (unknown)               Expected Discharge Plan:  Home/Self Care  In-House Referral:     Discharge planning Services  CM Consult  Post Acute Care Choice:    Choice offered to:     DME Arranged:    DME Agency:     HH Arranged:    HH Agency:     Status of Service:  In process, will continue to follow  If discussed at Long Length of Stay Meetings, dates discussed:    Additional Comments:  Leeroy Cha, RN 10/24/2017, 9:45 AM

## 2017-10-24 NOTE — Progress Notes (Signed)
[facility] PCCM  Off of vasopressors Now in Afib with RVR  Restart low dose metoprolol  Roselie Awkward, MD [place] PCCM Pager: [phone] Cell: [phone] After 3pm or if no response, call [phone]

## 2017-10-24 NOTE — Progress Notes (Signed)
eLink Physician-Brief Progress Note Patient Name: Benjamin Patel DOB: 01-25-1927 MRN: [id number]   Date of Service  10/24/2017  HPI/Events of Note  AF-RVR  Started on low dose metoprolol earlier - on 100 mg at home + cardizem XL  eICU Interventions  Use iV lopressor prn Resume cardizem 60 q8h     Intervention Category Major Interventions: Arrhythmia - evaluation and management  Rakesh V. Alva 10/24/2017, 7:28 PM

## 2017-10-25 ENCOUNTER — Inpatient Hospital Stay (HOSPITAL_COMMUNITY): Payer: Medicare Other

## 2017-10-25 DIAGNOSIS — N390 Urinary tract infection, site not specified: Secondary | ICD-10-CM

## 2017-10-25 LAB — BASIC METABOLIC PANEL
ANION GAP: 7 (ref 5–15)
BUN: 40 mg/dL — ABNORMAL HIGH (ref 8–23)
CO2: 28 mmol/L (ref 22–32)
Calcium: 8.9 mg/dL (ref 8.9–10.3)
Chloride: 105 mmol/L (ref 98–111)
Creatinine, Ser: 1.49 mg/dL — ABNORMAL HIGH (ref 0.61–1.24)
GFR calc Af Amer: 46 mL/min — ABNORMAL LOW (ref >60)
GFR, EST NON AFRICAN AMERICAN: 40 mL/min — AB (ref >60)
GLUCOSE: 147 mg/dL — AB (ref 70–99)
POTASSIUM: 3.7 mmol/L (ref 3.5–5.1)
Sodium: 140 mmol/L (ref 135–145)

## 2017-10-25 LAB — CBC
HCT: 31.9 % — ABNORMAL LOW (ref 39.0–52.0)
Hemoglobin: 9.7 g/dL — ABNORMAL LOW (ref 13.0–17.0)
MCH: 28 pg (ref 26.0–34.0)
MCHC: 30.4 g/dL (ref 30.0–36.0)
MCV: 92.2 fL (ref 80.0–100.0)
Platelets: 177 10*3/uL (ref 150–400)
RBC: 3.46 MIL/uL — AB (ref 4.22–5.81)
RDW: 15.4 % (ref 11.5–15.5)
WBC: 10.8 10*3/uL — ABNORMAL HIGH (ref 4.0–10.5)
nRBC: 0 % (ref 0.0–0.2)

## 2017-10-25 LAB — URINE CULTURE

## 2017-10-25 MED ORDER — IOPAMIDOL (ISOVUE-300) INJECTION 61%
INTRAVENOUS | Status: AC
  Filled 2017-10-25: qty 30

## 2017-10-25 MED ORDER — TRAZODONE HCL 50 MG PO TABS
50.0000 mg | ORAL_TABLET | Freq: Every evening | ORAL | Status: DC | PRN
  Administered 2017-10-29: 50 mg via ORAL
  Filled 2017-10-25: qty 1

## 2017-10-25 MED ORDER — IOPAMIDOL (ISOVUE-300) INJECTION 61%
30.0000 mL | Freq: Once | INTRAVENOUS | Status: AC | PRN
  Administered 2017-10-25: 13:00:00
  Administered 2017-10-25: 30 mL

## 2017-10-25 MED ORDER — IOPAMIDOL (ISOVUE-300) INJECTION 61%: 15.0000 mL | Freq: Once | INTRAVENOUS | Status: DC | PRN

## 2017-10-25 MED ORDER — METOPROLOL TARTRATE 25 MG PO TABS
50.0000 mg | ORAL_TABLET | Freq: Two times a day (BID) | ORAL | Status: DC
  Administered 2017-10-25 – 2017-10-27 (×5): 50 mg via ORAL
  Filled 2017-10-25 (×5): qty 2

## 2017-10-25 MED ORDER — SODIUM CHLORIDE 0.9 % IV SOLN
2.0000 g | INTRAVENOUS | Status: DC
  Administered 2017-10-25 – 2017-10-29 (×5): 2 g via INTRAVENOUS
  Filled 2017-10-25 (×5): qty 2
  Filled 2017-10-25: qty 20

## 2017-10-25 NOTE — Progress Notes (Signed)
Pharmacy Antibiotic Note  Benjamin Patel is a 84 y.o. male with PMH multiple UTI since February treated with multiple courses of abx, admitted on 10/23/2017 with septic shock d/t UTI. Frank pus noted in urine, good UA sample and grossly positive. Also noted to have loose stools, although quality not specified - no sample obtained for C.diff testing. Pharmacy initially consulted for Vancomycin and meropenem dosing.  Today, 10/25/2017 Day #3 antibiotics - narrowing to ceftriaxone since no resistant organisms identified Afebrile WBC 10.8, improved SCr 1.49, improved Off pressors  Plan: Ceftriaxone 2g IV q24h No dosage adjustments needed, pharmacy will sign off dosing/note writing protocol but will continue to follow along with CCM monitoring  Height: 5\' 8"  (172.7 cm) Weight: 175 lb 4.3 oz (79.5 kg) IBW/kg (Calculated) : 68.4  Temp (24hrs), Avg:97.7 F (36.5 C), Min:97.3 F (36.3 C), Max:98.2 F (36.8 C)  Recent Labs  Lab 10/23/17 1121 10/23/17 1126 10/24/17 0753 10/25/17 0318  WBC 18.0*  --  24.3* 10.8*  CREATININE 1.99* 1.80* 1.56* 1.49*  LATICACIDVEN 3.20*  --   --   --     Estimated Creatinine Clearance: 33.2 mL/min (A) (by C-G formula based on SCr of 1.49 mg/dL (H)).    Allergies  Allergen Reactions  . Ace Inhibitors Cough  . Codeine Other (See Comments)    GI Upset  . Indomethacin Other (See Comments)    Elevated BP  . Losartan Swelling    Angioedema    Antimicrobials this admission: 10/9 Vancomycin >> 10/10 10/9 Meropenem >> 10/11 10/11 Ceftriaxone >>  Dose adjustments this admission:   Microbiology results: 10/9 BCx: ngtd 10/9 UCx: multiple species 10/9 Cdiff: cancelled 10/9 MRSA PCR: negative  Thank you for allowing pharmacy to be a part of this patient's care.  Peggyann Juba, PharmD, BCPS Pager: [phone] 10/25/2017, 10:09 AM

## 2017-10-25 NOTE — H&P (Signed)
CC: Consulted by Dr. Lake Bells for colovesical fistula  HPI: Benjamin Patel is an 84 y.o. male with hx of HTN, COPD, PAD, CKD II, carotid artery stenosis (following with Dr. Donnetta Hutching), HLD, chronic Afib (on eliquis), recurrent UTIs presented to [facility] ED with generalized weakness and found to be in septic shock with pyuria.  He was admitted to the intensive care unit and started on broad-spectrum IV antibiotics as well as vasopressor support.  He has steadily improved over the last 2 days.  He underwent work-up and a consultation was placed urology, Dr. Alinda Money.  A Foley catheter then placed and his pyuria subsequently changed to stool in the urine.  A CT scan was obtained today of the abdomen and pelvis with oral and rectal contrast.  This demonstrated gas and enteric contrast within the bladder lumen consistent with an enterovesicular fistula.  Favored colovesicular fistula with sigmoid colon approximately the bladder wall.  Multiple diverticula were seen throughout this region of sigmoid colon.  The small bowel also approximated the dome of the bladder.  Tonight, he reports doing quite well.  He denies abdominal pain.  He denies any history of abdominal pain.  His oldest son has a history of emergent Hartman's procedure for perforated diverticulitis.  The patient reports being well aware of the symptoms of diverticulitis and denies ever having had these kinds of things before.  He states that he has had recurrent UTIs dating back approximately 8 months to February of this year.  Last colonoscopy 05/30/10, Dr. Watt Climes - hemorrhoids, pandiverticulosis, multiple small polyps removed  He denies any prior abdominal operations.   Past Medical History:  Diagnosis Date  . Abnormal prostate exam   . Arthritis    Gout   . Asthma    as a child  . Carotid stenosis   . Carotid stenosis right side   Dr.Early does carotid ultrasounds yrly-last one Mar 14-report in epic  . Cholelithiasis   . COPD (chronic  obstructive pulmonary disease) (HCC)    slight   . Enlarged prostate    slightly   . Essential hypertension, benign    takes Clonodine,Losartan,and Metoprolol and HCTZ  . Gout    no meds required  . History of bronchitis   . History of colon polyps   . History of shingles   . Lung cancer ([place]) dx'd 2014   LLL resection; xrt  . Mixed hyperlipidemia    takes Pravastatin daily  . PAC (premature atrial contraction)   . Pneumonia 02/08/2012  . PVCs (premature ventricular contractions)   . Radiation 12/13/14-12/24/14   left upper lobe nodule 50 gray  . Renal artery stenosis (HCC)    right  . Renal insufficiency   . Skin cancer    Lung Ca- Left Lung  . Type II or unspecified type diabetes mellitus without mention of complication, not stated as uncontrolled    02/14/2016 diet controlled    Past Surgical History:  Procedure Laterality Date  . COLONOSCOPY    . NO PAST SURGERIES    . SEGMENTECOMY Left 06/16/2012   Procedure: left lower lobe superior SEGMENTECTOMY with node dissection;  Surgeon: [name], MD;  Location: [facility];  Service: Thoracic;  Laterality: Left;  left superior  . VIDEO ASSISTED THORACOSCOPY Left 06/16/2012   Procedure: VIDEO ASSISTED THORACOSCOPY;  Surgeon: [name], MD;  Location: [facility] OR;  Service: Thoracic;  Laterality: Left;    Family History  Problem Relation Age of Onset  . Heart disease Mother   .  Diabetes Mother        AKA  Right   . Hypertension Mother   . Varicose Veins Mother   . Kidney disease Mother        One Kidney  . Cancer Father        Lung    Social:  reports that he quit smoking about 41 years ago. His smoking use included cigarettes. He has a 45.00 pack-year smoking history. He has never used smokeless tobacco. He reports that he does not drink alcohol or use drugs.  Allergies:  Allergies  Allergen Reactions  . Ace Inhibitors Cough  . Codeine Other (See Comments)    GI Upset  . Indomethacin Other (See Comments)      Elevated BP  . Losartan Swelling    Angioedema    Medications: I have reviewed the patient's current medications.  Results for orders placed or performed during the hospital encounter of 10/23/17 (from the past 48 hour(s))  CBC     Status: Abnormal   Collection Time: 10/24/17  7:53 AM  Result Value Ref Range   WBC 24.3 (H) 4.0 - 10.5 K/uL   RBC 3.56 (L) 4.22 - 5.81 MIL/uL   Hemoglobin 10.0 (L) 13.0 - 17.0 g/dL   HCT 33.5 (L) 39.0 - 52.0 %   MCV 94.1 80.0 - 100.0 fL   MCH 28.1 26.0 - 34.0 pg   MCHC 29.9 (L) 30.0 - 36.0 g/dL   RDW 15.7 (H) 11.5 - 15.5 %   Platelets 223 150 - 400 K/uL   nRBC 0.0 0.0 - 0.2 %    Comment: Performed at [facility], [place] [address], [place], [place] [address]  Basic metabolic panel     Status: Abnormal   Collection Time: 10/24/17  7:53 AM  Result Value Ref Range   Sodium 140 135 - 145 mmol/L    Comment: DELTA CHECK NOTED   Potassium 4.0 3.5 - 5.1 mmol/L   Chloride 104 98 - 111 mmol/L   CO2 27 22 - 32 mmol/L   Glucose, Bld 100 (H) 70 - 99 mg/dL   BUN 37 (H) 8 - 23 mg/dL   Creatinine, Ser 1.56 (H) 0.61 - 1.24 mg/dL   Calcium 8.6 (L) 8.9 - 10.3 mg/dL   GFR calc non Af Amer 38 (L) >60 mL/min   GFR calc Af Amer 44 (L) >60 mL/min    Comment: (NOTE) The eGFR has been calculated using the CKD EPI equation. This calculation has not been validated in all clinical situations. eGFR's persistently <60 mL/min signify possible Chronic Kidney Disease.    Anion gap 9 5 - 15    Comment: Performed at [facility], [place] [address] [place], [place] [address]  Magnesium     Status: Abnormal   Collection Time: 10/24/17  7:53 AM  Result Value Ref Range   Magnesium 1.6 (L) 1.7 - 2.4 mg/dL    Comment: Performed at [facility], [place] [address], [place], [place] [address]  Phosphorus     Status: None   Collection Time: 10/24/17  7:53 AM  Result Value Ref Range   Phosphorus 3.4 2.5 - 4.6 mg/dL    Comment:  Performed at [facility], [place] [address] [place], [place] [address]  Basic metabolic panel     Status: Abnormal   Collection Time: 10/25/17  3:18 AM  Result Value Ref Range   Sodium 140 135 - 145 mmol/L   Potassium 3.7  3.5 - 5.1 mmol/L   Chloride 105 98 - 111 mmol/L   CO2 28 22 - 32 mmol/L   Glucose, Bld 147 (H) 70 - 99 mg/dL   BUN 40 (H) 8 - 23 mg/dL   Creatinine, Ser 1.49 (H) 0.61 - 1.24 mg/dL   Calcium 8.9 8.9 - 10.3 mg/dL   GFR calc non Af Amer 40 (L) >60 mL/min   GFR calc Af Amer 46 (L) >60 mL/min    Comment: (NOTE) The eGFR has been calculated using the CKD EPI equation. This calculation has not been validated in all clinical situations. eGFR's persistently <60 mL/min signify possible Chronic Kidney Disease.    Anion gap 7 5 - 15    Comment: Performed at [facility], [place] [address], [place], [place] [address]  CBC     Status: Abnormal   Collection Time: 10/25/17  3:18 AM  Result Value Ref Range   WBC 10.8 (H) 4.0 - 10.5 K/uL   RBC 3.46 (L) 4.22 - 5.81 MIL/uL   Hemoglobin 9.7 (L) 13.0 - 17.0 g/dL   HCT 31.9 (L) 39.0 - 52.0 %   MCV 92.2 80.0 - 100.0 fL   MCH 28.0 26.0 - 34.0 pg   MCHC 30.4 30.0 - 36.0 g/dL   RDW 15.4 11.5 - 15.5 %   Platelets 177 150 - 400 K/uL   nRBC 0.0 0.0 - 0.2 %    Comment: Performed at [facility], [place] [address], [place], [place] [address]    Ct Abdomen Pelvis Wo Contrast  Result Date: 10/25/2017 CLINICAL DATA:  Patient has feces in his Foley catheter. Concern for colovesicular fistula. Rectal contrast normal contrast administered. EXAM: CT ABDOMEN AND PELVIS WITHOUT CONTRAST TECHNIQUE: Multidetector CT imaging of the abdomen and pelvis was performed following the standard protocol without IV contrast. COMPARISON:  PET-CT 11/10/2014 FINDINGS: Lower chest: Bilateral pleural effusions moderate in volume. Hepatobiliary: No focal hepatic lesion on noncontrast exam. Gallstones layer within the  gallbladder. No gallbladder distension. Pancreas: Pancreas is normal. No ductal dilatation. No pancreatic inflammation. Spleen: Normal spleen Adrenals/urinary tract: Kidneys are normal. Nonobstructing calculus in lower pole LEFT kidney. Ureters grossly normal and difficult to follow. There is large calculus within the distal LEFT ureter measuring 12 mm (image 60/2). This calculus is approximately 4 cm from the vascular junction identified on PET-CT scan 11/10/2014. Foley catheter within lumen of the bladder. There is moderate volume of gas within the lumen of bladder. Additionally, there is high-density contrast within the bladder which presumably originates from the bowel. The sigmoid colon approximates the dome of the bladder. There is no clear plane between the bladder and sigmoid colon. Several potential communicating surfaces between the sigmoid colon and bladder. There are multiple diverticular through this region. The small bowel also approximates the dome of the bladder. Enteric contrast via oral route progress into the mid small bowel. Stomach/Bowel: Stomach, small-bowel cecum normal. Appendix normal. Contrast progresses to the mid small bowel. The ascending and transverse colon normal. Multiple diverticula of the sigmoid colon. Again the colon approximates the bladder wall. Suspicion for colovesicular fistula. Vascular/Lymphatic: Abdominal aorta is normal caliber with atherosclerotic calcification. There is no retroperitoneal or periportal lymphadenopathy. No pelvic lymphadenopathy. Reproductive: Prostate grossly normal. Small focus of gas within the LEFT aspect of the prostate gland. Other: No free fluid. Musculoskeletal: No aggressive osseous lesion. IMPRESSION: 1. Gas and enteric contrast within the bladder lumen consistent with enterovesicular fistula. Favor colovesicular fistula with the sigmoid colon approximating  the bladder wall. Multiple diverticula present through this region of sigmoid colon.  The small bowel also approximates the dome of the bladder but less favored to be a source of fistula. 2. Chronic large distal LEFT ureteral calculus.  No obstruction. 3. Moderate bilateral pleural effusions. These results will be called to the ordering clinician or representative by the Radiologist Assistant, and communication documented in the PACS or zVision Dashboard. Electronically Signed   By: Suzy Bouchard M.D.   On: 10/25/2017 16:41    ROS - all of the below systems have been reviewed with the patient and positives are indicated with bold text General: chills, fever or night sweats Eyes: blurry vision or double vision ENT: epistaxis or sore throat Allergy/Immunology: itchy/watery eyes or nasal congestion Hematologic/Lymphatic: bleeding problems, blood clots or swollen lymph nodes Endocrine: temperature intolerance or unexpected weight changes Breast: new or changing breast lumps or nipple discharge Resp: cough, shortness of breath, or wheezing CV: chest pain or dyspnea on exertion GI: as per HPI GU: dysuria, trouble voiding, or hematuria MSK: joint pain or joint stiffness Neuro: TIA or stroke symptoms Derm: pruritus and skin lesion changes Psych: anxiety and depression  PE Blood pressure (!) 144/78, pulse (!) 111, temperature (!) 97.5 F (36.4 C), temperature source Oral, resp. rate 20, height _0  (1.727 m), weight 79.5 kg, SpO2 96 %. Constitutional: NAD; conversant; no deformities Eyes: Moist conjunctiva; no lid lag; anicteric; PERRL Neck: Trachea midline; no thyromegaly Lungs: Normal respiratory effort; no tactile fremitus CV: RRR; no palpable thrills; no pitting edema GI: Abd soft, NT/ND; no rebound, no guarding; no palpable hepatosplenomegaly GU: Foley catheter in place with feculent contents in collection bag MSK: Normal gait; no clubbing/cyanosis Psychiatric: Appropriate affect; alert and oriented x3 Lymphatic: No palpable cervical or axillary  lymphadenopathy  Results for orders placed or performed during the hospital encounter of 10/23/17 (from the past 48 hour(s))  CBC     Status: Abnormal   Collection Time: 10/24/17  7:53 AM  Result Value Ref Range   WBC 24.3 (H) 4.0 - 10.5 K/uL   RBC 3.56 (L) 4.22 - 5.81 MIL/uL   Hemoglobin 10.0 (L) 13.0 - 17.0 g/dL   HCT 33.5 (L) 39.0 - 52.0 %   MCV 94.1 80.0 - 100.0 fL   MCH 28.1 26.0 - 34.0 pg   MCHC 29.9 (L) 30.0 - 36.0 g/dL   RDW 15.7 (H) 11.5 - 15.5 %   Platelets 223 150 - 400 K/uL   nRBC 0.0 0.0 - 0.2 %    Comment: Performed at [facility], [place] [address], [place], [place] [address]  Basic metabolic panel     Status: Abnormal   Collection Time: 10/24/17  7:53 AM  Result Value Ref Range   Sodium 140 135 - 145 mmol/L    Comment: DELTA CHECK NOTED   Potassium 4.0 3.5 - 5.1 mmol/L   Chloride 104 98 - 111 mmol/L   CO2 27 22 - 32 mmol/L   Glucose, Bld 100 (H) 70 - 99 mg/dL   BUN 37 (H) 8 - 23 mg/dL   Creatinine, Ser 1.56 (H) 0.61 - 1.24 mg/dL   Calcium 8.6 (L) 8.9 - 10.3 mg/dL   GFR calc non Af Amer 38 (L) >60 mL/min   GFR calc Af Amer 44 (L) >60 mL/min    Comment: (NOTE) The eGFR has been calculated using the CKD EPI equation. This calculation has not been validated in all clinical situations. eGFR's persistently <60 mL/min  signify possible Chronic Kidney Disease.    Anion gap 9 5 - 15    Comment: Performed at [facility], [place] [address], [place], [place] [address]  Magnesium     Status: Abnormal   Collection Time: 10/24/17  7:53 AM  Result Value Ref Range   Magnesium 1.6 (L) 1.7 - 2.4 mg/dL    Comment: Performed at [facility], [place] [address] [place], [place] [address]  Phosphorus     Status: None   Collection Time: 10/24/17  7:53 AM  Result Value Ref Range   Phosphorus 3.4 2.5 - 4.6 mg/dL    Comment: Performed at [facility], [place] [address], [place], [place] [address]  Basic metabolic  panel     Status: Abnormal   Collection Time: 10/25/17  3:18 AM  Result Value Ref Range   Sodium 140 135 - 145 mmol/L   Potassium 3.7 3.5 - 5.1 mmol/L   Chloride 105 98 - 111 mmol/L   CO2 28 22 - 32 mmol/L   Glucose, Bld 147 (H) 70 - 99 mg/dL   BUN 40 (H) 8 - 23 mg/dL   Creatinine, Ser 1.49 (H) 0.61 - 1.24 mg/dL   Calcium 8.9 8.9 - 10.3 mg/dL   GFR calc non Af Amer 40 (L) >60 mL/min   GFR calc Af Amer 46 (L) >60 mL/min    Comment: (NOTE) The eGFR has been calculated using the CKD EPI equation. This calculation has not been validated in all clinical situations. eGFR's persistently <60 mL/min signify possible Chronic Kidney Disease.    Anion gap 7 5 - 15    Comment: Performed at [facility], [place] [address], [place], [place] [address]  CBC     Status: Abnormal   Collection Time: 10/25/17  3:18 AM  Result Value Ref Range   WBC 10.8 (H) 4.0 - 10.5 K/uL   RBC 3.46 (L) 4.22 - 5.81 MIL/uL   Hemoglobin 9.7 (L) 13.0 - 17.0 g/dL   HCT 31.9 (L) 39.0 - 52.0 %   MCV 92.2 80.0 - 100.0 fL   MCH 28.0 26.0 - 34.0 pg   MCHC 30.4 30.0 - 36.0 g/dL   RDW 15.4 11.5 - 15.5 %   Platelets 177 150 - 400 K/uL   nRBC 0.0 0.0 - 0.2 %    Comment: Performed at [facility], [place] [address] [place], [place] [address]    Ct Abdomen Pelvis Wo Contrast  Result Date: 10/25/2017 CLINICAL DATA:  Patient has feces in his Foley catheter. Concern for colovesicular fistula. Rectal contrast normal contrast administered. EXAM: CT ABDOMEN AND PELVIS WITHOUT CONTRAST TECHNIQUE: Multidetector CT imaging of the abdomen and pelvis was performed following the standard protocol without IV contrast. COMPARISON:  PET-CT 11/10/2014 FINDINGS: Lower chest: Bilateral pleural effusions moderate in volume. Hepatobiliary: No focal hepatic lesion on noncontrast exam. Gallstones layer within the gallbladder. No gallbladder distension. Pancreas: Pancreas is normal. No ductal dilatation. No pancreatic  inflammation. Spleen: Normal spleen Adrenals/urinary tract: Kidneys are normal. Nonobstructing calculus in lower pole LEFT kidney. Ureters grossly normal and difficult to follow. There is large calculus within the distal LEFT ureter measuring 12 mm (image 60/2). This calculus is approximately 4 cm from the vascular junction identified on PET-CT scan 11/10/2014. Foley catheter within lumen of the bladder. There is moderate volume of gas within the lumen of bladder. Additionally, there is high-density contrast within the bladder which presumably originates from the bowel. The  sigmoid colon approximates the dome of the bladder. There is no clear plane between the bladder and sigmoid colon. Several potential communicating surfaces between the sigmoid colon and bladder. There are multiple diverticular through this region. The small bowel also approximates the dome of the bladder. Enteric contrast via oral route progress into the mid small bowel. Stomach/Bowel: Stomach, small-bowel cecum normal. Appendix normal. Contrast progresses to the mid small bowel. The ascending and transverse colon normal. Multiple diverticula of the sigmoid colon. Again the colon approximates the bladder wall. Suspicion for colovesicular fistula. Vascular/Lymphatic: Abdominal aorta is normal caliber with atherosclerotic calcification. There is no retroperitoneal or periportal lymphadenopathy. No pelvic lymphadenopathy. Reproductive: Prostate grossly normal. Small focus of gas within the LEFT aspect of the prostate gland. Other: No free fluid. Musculoskeletal: No aggressive osseous lesion. IMPRESSION: 1. Gas and enteric contrast within the bladder lumen consistent with enterovesicular fistula. Favor colovesicular fistula with the sigmoid colon approximating the bladder wall. Multiple diverticula present through this region of sigmoid colon. The small bowel also approximates the dome of the bladder but less favored to be a source of fistula. 2.  Chronic large distal LEFT ureteral calculus.  No obstruction. 3. Moderate bilateral pleural effusions. These results will be called to the ordering clinician or representative by the Radiologist Assistant, and communication documented in the PACS or zVision Dashboard. Electronically Signed   By: Suzy Bouchard M.D.   On: 10/25/2017 16:41   A/P: Benjamin Patel is an 84 y.o. male with HTN, COPD, PAD, CKD II, carotid artery stenosis (following with Dr. Donnetta Hutching), HLD, chronic Afib (on eliquis), recurrent UTIs - admitted with urosepsis - found on workup to have probable most likely a colovesical fistula  -He reports being bed bound for the past week but that prior was up walking around at home and faily independent - although there has been a decline in the last 8 mo 2/2 more frequent falls and UTIs -Would have cardiology see him (normally follows with Dr. Marlou Porch) for risk stratification so we can have more informed discussions moving forward. That said, his risk from surgery extends beyond cardiac in nature - given his significant carotid stenosis, stroke is a major concern as well -He would need to be under better rate control and off his blood thinner as well -Should we pursue surgery, would consider fecal diversion over anything more involved such as a sigmoid colectomy -For time being, would place on fiber supplementation BID as a bulkening agent  Sharon Mt. Dema Severin, M.D. [facility]

## 2017-10-25 NOTE — Progress Notes (Signed)
NAME:  Benjamin Patel, MRN:  [id number], DOB:  [birth year], LOS: 2 ADMISSION DATE:  10/23/2017, CONSULTATION DATE: 10/9 REFERRING MD: Emergency room physician, CHIEF COMPLAINT: Optic shock  Brief History   84 year old male with frequent urinary tract infections admitted on 10/9 with sepsis in setting of urinary tract infection  Past Medical History  Atrial fibrillation on Eliquis and rate control, enlarged prostate, COPD, type 2 diabetes Significant Hospital Events   10/9: Admitted with septic shock in setting of urinary tract infection, complicated by acute kidney injury placed on peripheral Neo-Synephrine and IV hydration.  Antibiotics initiated 10/10: Weaning Neo-Synephrine denies pain or shortness of breath culture still pending 10/11: Been off Neo-Synephrine since 10/10 hemodynamically stable now.  Did have run of rapid ventricular response this is better since adding Cardizem back.  Urine still with feculent matter.  Asking urology to see him.  Urine Culture with multiple organisms  Consults: date of consult/date signed off & final recs:   Allergy consulted 10/11 for possible colovesicular fistula   Procedures (surgical and bedside):    Significant Diagnostic Tests:  Renal ultrasound on 10/9:No active renal findings bladder was decompressed  Micro Data:  10/9: Blood cultures x2 Urine culture 10/9: Multiple organisms  C. difficile 10/9  Antimicrobials:  Meropenem 10/9>>>10/11 Rocephin 10/11>>> Vancomycin 10/9; stopped 10/10   Subjective:  10 used to feel better, does feel weak.  Objective   Blood pressure (Abnormal) 142/87, pulse (Abnormal) 117, temperature (Abnormal) 97.3 F (36.3 C), temperature source Oral, resp. rate (Abnormal) 21, height 5\' 8"  (1.727 m), weight 79.5 kg, SpO2 92 %.        Intake/Output Summary (Last 24 hours) at 10/25/2017 0951 Last data filed at 10/25/2017 3244 Gross per 24 hour  Intake 1098.49 ml  Output 1225 ml  Net -126.51 ml   Filed  Weights   10/23/17 1548 10/24/17 0500 10/25/17 0439  Weight: 73.7 kg 75.9 kg 79.5 kg    Examination: General: This is a frail 84 year old white male currently resting in bed he is in no distress HEENT normocephalic atraumatic no jugular venous distention Pulmonary: Diminished bases no accessory use Cardiac: Regular rate and rhythm Abdomen: Soft nontender Extremities: Dependent edema warm dry GU: Feculent appearing urine  Resolved Hospital Problem list   Septic shock  Assessment & Plan:  Sepsis secondary to urinary tract source -Has had several antibiotic exposures -Off pressors.  Urinary culture showed multiple organisms.  It appears as though he has fecaluria, I wonder about colovesicular fistula Plan KVO IV fluids Day #3 meropenem, will change this to Rocephin as no Resistant organisms have been identified We will ask urology to see him   Diarrhea.   This was noted outpatient, he has had no further bowel movement since admission.  I am doubtful this is C. difficile  Plan  Supportive care  history of atrial fibrillation with rapid ventricular response currently.  This has improved Plan Continuing Cardizem and metoprolol Continue Eliquis Continue telemetry monitoring  Acute on chronic renal failure Appears as though baseline creatinine around 1.4-1.5.  He peaked at 1.99 following admission.  Now trending back down following IV hydration and fluid resuscitation. Plan KVO IV fluids A.m. chemistry  Fluid and electrolyte balance: Hypo-magnesemia  Plan Recheck magnesium in a.m.  Anemia without evidence of bleeding Plan CBC in a.m.  Peripheral edema, chronic. Plan Pression stockings  Diabetes, diet controlled Plan Carb  modified diet  Disposition / Summary of Today's Plan 10/25/17   Continue telemetry monitoring narrow antibiotics to  ceftriaxone asked urology to see transfer to [facility]    Diet: Carb modified diet Pain/Anxiety/Delirium protocol (if indicated):  Not indicated VAP protocol (if indicated): Not indicated DVT prophylaxis: Eliquis GI prophylaxis: Not indicated Hyperglycemia protocol: Not indicated Mobility: Out of bed Code Status: Full code Family Communication: Wife updated extensively at bedside  Labs   CBC: Recent Labs  Lab 10/23/17 1121 10/23/17 1126 10/24/17 0753 10/25/17 0318  WBC 18.0*  --  24.3* 10.8*  NEUTROABS 15.3*  --   --   --   HGB 9.0* 8.8* 10.0* 9.7*  HCT 29.1* 26.0* 33.5* 31.9*  MCV 92.7  --  94.1 92.2  PLT 184  --  223 817    Basic Metabolic Panel: Recent Labs  Lab 10/23/17 1121 10/23/17 1126 10/24/17 0753 10/25/17 0318  NA 136 133* 140 140  K 4.6 4.6 4.0 3.7  CL 97* 94* 104 105  CO2 30  --  27 28  GLUCOSE 126* 115* 100* 147*  BUN 43* 46* 37* 40*  CREATININE 1.99* 1.80* 1.56* 1.49*  CALCIUM 9.0  --  8.6* 8.9  MG  --   --  1.6*  --   PHOS  --   --  3.4  --    GFR: Estimated Creatinine Clearance: 33.2 mL/min (A) (by C-G formula based on SCr of 1.49 mg/dL (H)). Recent Labs  Lab 10/23/17 1121 10/24/17 0753 10/25/17 0318  WBC 18.0* 24.3* 10.8*  LATICACIDVEN 3.20*  --   --     Liver Function Tests: Recent Labs  Lab 10/23/17 1121  AST 26  ALT 15  ALKPHOS 53  BILITOT 0.9  PROT 5.1*  ALBUMIN 2.6*   No results for input(s): LIPASE, AMYLASE in the last 168 hours. No results for input(s): AMMONIA in the last 168 hours.  ABG    Component Value Date/Time   PHART 7.381 06/17/2012 0448   PCO2ART 43.0 06/17/2012 0448   PO2ART 68.0 (L) 06/17/2012 0448   HCO3 25.6 (H) 06/17/2012 0448   TCO2 30 10/23/2017 1126   O2SAT 93.0 06/17/2012 0448     Coagulation Profile: Recent Labs  Lab 10/23/17 1121  INR 3.22    Cardiac Enzymes: No results for input(s): CKTOTAL, CKMB, CKMBINDEX, TROPONINI in the last 168 hours.  HbA1C: Hemoglobin A1C  Date/Time Value Ref Range Status  04/21/2015 10:15 AM 6.7  Final  12/23/2014 09:30 AM 6.4  Final    CBG: Recent Labs  Lab 10/23/17 [place] ACNP-BC [place] Pager # [phone] OR # [phone] if no answer

## 2017-10-25 NOTE — Consult Note (Signed)
Urology Consult   Physician requesting consult: Dr. Lake Bells  Reason for consult: Possible colovesical fistula, recurrent UTIs  History of Present Illness: Benjamin Patel is a 84 y.o. patient followed by Dr. Karsten Ro and treated for recurrent polymicrobial UTIs since June 2019. His symptoms have mostly been limited to lower urinary tract symptoms including dysuria, urgency, frequency, etc.  Over the past month, he began having fecaluria and is now admitted with a febrile UTI with sepsis.  His urine culture again demonstrates multiple organisms.  He has no history of pelvic or abdominal radiation.  He has no history of diverticulitis.  He denies a history of colon cancer and has not had any melena or hematochezia.  He believes his last colonoscopy was over 10 years ago.  Currently, he is improved on antibiotic therapy.   He denies a history of hematuria, STDs, urolithiasis, GU malignancy/trauma/surgery.  He has a history of an abnormal DRE despite normal PSA and a prostate biopsy in 2007 was benign.  Past Medical History:  Diagnosis Date  . Abnormal prostate exam   . Arthritis    Gout   . Asthma    as a child  . Carotid stenosis   . Carotid stenosis right side   Dr.Early does carotid ultrasounds yrly-last one Mar 14-report in epic  . Cholelithiasis   . COPD (chronic obstructive pulmonary disease) (HCC)    slight   . Enlarged prostate    slightly   . Essential hypertension, benign    takes Clonodine,Losartan,and Metoprolol and HCTZ  . Gout    no meds required  . History of bronchitis   . History of colon polyps   . History of shingles   . Lung cancer ([facility]) dx'd 2014   LLL resection; xrt  . Mixed hyperlipidemia    takes Pravastatin daily  . PAC (premature atrial contraction)   . Pneumonia 02/08/2012  . PVCs (premature ventricular contractions)   . Radiation 12/13/14-12/24/14   left upper lobe nodule 50 gray  . Renal artery stenosis (HCC)    right  . Renal insufficiency   .  Skin cancer    Lung Ca- Left Lung  . Type II or unspecified type diabetes mellitus without mention of complication, not stated as uncontrolled    02/14/2016 diet controlled    Past Surgical History:  Procedure Laterality Date  . COLONOSCOPY    . NO PAST SURGERIES    . SEGMENTECOMY Left 06/16/2012   Procedure: left lower lobe superior SEGMENTECTOMY with node dissection;  Surgeon: [name], MD;  Location: [facility];  Service: Thoracic;  Laterality: Left;  left superior  . VIDEO ASSISTED THORACOSCOPY Left 06/16/2012   Procedure: VIDEO ASSISTED THORACOSCOPY;  Surgeon: [name], MD;  Location: [facility];  Service: Thoracic;  Laterality: Left;    Medications:  Home meds:  Current Meds  Medication Sig  . allopurinol (ZYLOPRIM) 100 MG tablet Take 100 mg by mouth 2 (two) times daily.   Marland Kitchen apixaban (ELIQUIS) 5 MG TABS tablet Take 1 tablet (5 mg total) by mouth 2 (two) times daily.  . cholecalciferol (VITAMIN D) 1000 units tablet Take 1,000 Units by mouth 2 (two) times daily.   Marland Kitchen diltiazem (CARDIZEM CD) 180 MG 24 hr capsule Take 1 capsule (180 mg total) by mouth daily.  Marland Kitchen doxycycline (VIBRA-TABS) 100 MG tablet Take 100 mg by mouth every 12 (twelve) hours.  . gabapentin (NEURONTIN) 400 MG capsule Take 400 mg by mouth daily.  . metoprolol succinate (TOPROL-XL)  100 MG 24 hr tablet Take 1 tablet (100 mg total) by mouth daily. Take with or immediately following a meal.  . mirabegron ER (MYRBETRIQ) 25 MG TB24 tablet Take 25 mg by mouth daily.  . Probiotic Product (PROBIOTIC PO) Take 1 tablet by mouth daily.  . tamsulosin (FLOMAX) 0.4 MG CAPS capsule Take 0.4 mg by mouth daily.    Scheduled Meds: . apixaban  5 mg Oral BID  . chlorhexidine  15 mL Mouth Rinse BID  . diltiazem  60 mg Oral Q8H  . mouth rinse  15 mL Mouth Rinse q12n4p  . metoprolol tartrate  25 mg Oral BID   Continuous Infusions: . cefTRIAXone (ROCEPHIN)  IV     PRN Meds:.acetaminophen  Allergies:  Allergies   Allergen Reactions  . Ace Inhibitors Cough  . Codeine Other (See Comments)    GI Upset  . Indomethacin Other (See Comments)    Elevated BP  . Losartan Swelling    Angioedema    Family History  Problem Relation Age of Onset  . Heart disease Mother   . Diabetes Mother        AKA  Right   . Hypertension Mother   . Varicose Veins Mother   . Kidney disease Mother        One Kidney  . Cancer Father        Lung    Social History:  reports that he quit smoking about 41 years ago. His smoking use included cigarettes. He has a 45.00 pack-year smoking history. He has never used smokeless tobacco. He reports that he does not drink alcohol or use drugs.  ROS: A complete review of systems was performed.  All systems are negative except for pertinent findings as noted.  Physical Exam:  Vital signs in last 24 hours: Temp:  [97.3 F (36.3 C)-98.2 F (36.8 C)] 97.4 F (36.3 C) (10/11 1132) Pulse Rate:  [98-140] 124 (10/11 1052) Resp:  [14-26] 17 (10/11 1000) BP: (113-159)/(41-103) 149/103 (10/11 1052) SpO2:  [92 %-98 %] 98 % (10/11 1000) Weight:  [79.5 kg] 79.5 kg (10/11 0439) Constitutional:  Alert and oriented, No acute distress Cardiovascular: Regular rate and rhythm, No JVD Respiratory: Normal respiratory effort, Lungs clear bilaterally GI: Abdomen is soft, nontender, nondistended, no abdominal masses Genitourinary: No CVAT. Normal male phallus, testes are descended bilaterally and non-tender and without masses, scrotum is normal in appearance without lesions or masses, perineum is normal on inspection. Foley catheter in place with feculent appearing urine in bag. Rectal: Normal sphincter tone, no rectal masses, prostate is non tender and without nodularity. Prostate size is estimated to be 50 cc Lymphatic: No lymphadenopathy Neurologic: Grossly intact, no focal deficits Psychiatric: Normal mood and affect  Laboratory Data:  Recent Labs    10/23/17 1121 10/23/17 1126  10/24/17 0753 10/25/17 0318  WBC 18.0*  --  24.3* 10.8*  HGB 9.0* 8.8* 10.0* 9.7*  HCT 29.1* 26.0* 33.5* 31.9*  PLT 184  --  223 177    Recent Labs    10/23/17 1121 10/23/17 1126 10/24/17 0753 10/25/17 0318  NA 136 133* 140 140  K 4.6 4.6 4.0 3.7  CL 97* 94* 104 105  GLUCOSE 126* 115* 100* 147*  BUN 43* 46* 37* 40*  CALCIUM 9.0  --  8.6* 8.9  CREATININE 1.99* 1.80* 1.56* 1.49*     Results for orders placed or performed during the hospital encounter of 10/23/17 (from the past 24 hour(s))  Basic metabolic panel  Status: Abnormal   Collection Time: 10/25/17  3:18 AM  Result Value Ref Range   Sodium 140 135 - 145 mmol/L   Potassium 3.7 3.5 - 5.1 mmol/L   Chloride 105 98 - 111 mmol/L   CO2 28 22 - 32 mmol/L   Glucose, Bld 147 (H) 70 - 99 mg/dL   BUN 40 (H) 8 - 23 mg/dL   Creatinine, Ser 1.49 (H) 0.61 - 1.24 mg/dL   Calcium 8.9 8.9 - 10.3 mg/dL   GFR calc non Af Amer 40 (L) >60 mL/min   GFR calc Af Amer 46 (L) >60 mL/min   Anion gap 7 5 - 15  CBC     Status: Abnormal   Collection Time: 10/25/17  3:18 AM  Result Value Ref Range   WBC 10.8 (H) 4.0 - 10.5 K/uL   RBC 3.46 (L) 4.22 - 5.81 MIL/uL   Hemoglobin 9.7 (L) 13.0 - 17.0 g/dL   HCT 31.9 (L) 39.0 - 52.0 %   MCV 92.2 80.0 - 100.0 fL   MCH 28.0 26.0 - 34.0 pg   MCHC 30.4 30.0 - 36.0 g/dL   RDW 15.4 11.5 - 15.5 %   Platelets 177 150 - 400 K/uL   nRBC 0.0 0.0 - 0.2 %   Recent Results (from the past 240 hour(s))  Culture, blood (Routine x 2)     Status: None (Preliminary result)   Collection Time: 10/23/17 11:05 AM  Result Value Ref Range Status   Specimen Description   Final    BLOOD LEFT ANTECUBITAL Performed at [facility], [place] [address] [place], [place] [address]    Special Requests   Final    BOTTLES DRAWN AEROBIC AND ANAEROBIC Blood Culture adequate volume Performed at [facility] [address], [place], [place] [address]    Culture   Final    NO GROWTH < 24  HOURS Performed at [facility] [address], [place], [place] [address]    Report Status PENDING  Incomplete  Culture, blood (Routine x 2)     Status: None (Preliminary result)   Collection Time: 10/23/17 11:13 AM  Result Value Ref Range Status   Specimen Description   Final    BLOOD RIGHT ANTECUBITAL Performed at [facility] [address], [place], [place] [address]    Special Requests   Final    BOTTLES DRAWN AEROBIC AND ANAEROBIC Blood Culture adequate volume Performed at [facility] [address], [place], [place] [address]    Culture   Final    NO GROWTH < 24 HOURS Performed at [facility] [address], [place], [place] [address]    Report Status PENDING  Incomplete  Urine culture     Status: Abnormal   Collection Time: 10/23/17 11:21 AM  Result Value Ref Range Status   Specimen Description   Final    URINE, CATHETERIZED Performed at [facility] [address], [place], [place] [address]    Special Requests   Final    NONE Performed at [facility], [place] [address] [place], [place] [address]    Culture MULTIPLE SPECIES PRESENT, SUGGEST RECOLLECTION (A)  Final   Report Status 10/25/2017 FINAL  Final  MRSA PCR Screening     Status: None   Collection Time: 10/23/17  3:59 PM  Result Value Ref Range Status   MRSA by PCR NEGATIVE NEGATIVE Final    Comment:  The GeneXpert MRSA Assay (FDA approved for NASAL specimens only), is one component of a comprehensive MRSA colonization surveillance program. It is not intended to diagnose MRSA infection nor to guide or monitor treatment for MRSA infections. Performed at [facility], [place] [address] [place], [place] [address]     Renal Function: Recent Labs    10/23/17 1121 10/23/17 1126 10/24/17 0753 10/25/17 0318  CREATININE 1.99* 1.80* 1.56* 1.49*   Estimated Creatinine Clearance: 33.2  mL/min (A) (by C-G formula based on SCr of 1.49 mg/dL (H)).  Radiologic Imaging: US Renal  Result Date: 10/23/2017 CLINICAL DATA:  84 year old male with UTI.  Renal insufficiency. EXAM: RENAL / URINARY TRACT ULTRASOUND COMPLETE COMPARISON:  Chest CT 08/14/2017.  PET-CT 11/10/2014. FINDINGS: Right Kidney: Length: 9.7 centimeters. Increased renal echogenicity (image 5) and small simple upper pole cyst. No solid mass or hydronephrosis visualized. Left Kidney: Length: 10.6 centimeters. Left renal echogenicity similar to that on the right. Small simple upper pole cyst (image 28). No solid mass or hydronephrosis visualized. Bladder: Decompressed by a Foley catheter, which may explain the appearance of mild bladder wall thickening (image 47). Other findings: Small volume ascites in both upper quadrants. Spleen size at the upper limits of normal to mildly enlarged with volume estimated at 413 millimeters (normal splenic volume range 83 - 412 mL). IMPRESSION: 1. No acute renal findings. Chronic medical renal disease suspected. 2. Small volume ascites, nonspecific. Borderline to mild splenomegaly. 3. Bladder decompressed by Foley catheter. Electronically Signed   By: Genevie Ann M.D.   On: 10/23/2017 18:11    I independently reviewed the above imaging studies.  Impression/Recommendation Recurrent polymicrobial UTIs with fecaluria: Patient is clinically improving.  Continue current broad spectrum IV antibiotics.  Fecaluria is suspicious for possible colovesical fistula despite no risk factors.  I recommend proceed with CT scan with oral and rectal contrast for further evaluation.  Will order this and continue to follow patient.  Haze Antillon,LES 10/25/2017, 11:54 AM    Pryor Curia MD  CC: Dr. Lake Bells

## 2017-10-25 NOTE — Progress Notes (Signed)
Pt. Has not had any urine output today. MD called aware. Bladder scanned 31mL. Pt. Has had 3 loose stools today. Will continue to monitor

## 2017-10-25 NOTE — Progress Notes (Signed)
Patient ID: Benjamin Patel, male   DOB: 12/27/1927, 84 y.o.   MRN: [id number]  I reviewed his CT scan that confirms a colovesical fistula.  He appears to have a small abscess on independent review measuring about 2-3 cm just to the left and posterior to the bladder.  This is most likely related to diverticular disease.  There is no strong evidence to suggest colon malignancy. He also has an incidental large 12 mm distal left ureteral stone but without hydronephrosis.  1) Colovesical fistula: I would recommend a general surgery consultation tomorrow. He may benefit from palliative approach with  proximal bowel diversion but I am not sure he would be a candidate for repair of his fistula unless he indicates improved performance status.  Continue Foley catheter drainage of bladder.  2) Large distal left ureteral stone: In setting of UTI and recent fever, I would typically recommend left ureteral stent placement for optimal renal drainage.  However, the patient has no hydronephrosis which suggests he is not obstructed and he is asymptomatic and with stable renal function.  I discussed options with his family this evening (son and daughter in law) and offered a ureteral stent as an options but they were in agreement to hold off on intervention for now in the absence of clear obstruction and no fever as long as his clinical situation continues to improve.

## 2017-10-26 ENCOUNTER — Inpatient Hospital Stay (HOSPITAL_COMMUNITY): Payer: Medicare Other

## 2017-10-26 DIAGNOSIS — R509 Fever, unspecified: Secondary | ICD-10-CM

## 2017-10-26 DIAGNOSIS — I959 Hypotension, unspecified: Secondary | ICD-10-CM

## 2017-10-26 DIAGNOSIS — N390 Urinary tract infection, site not specified: Secondary | ICD-10-CM | POA: Diagnosis present

## 2017-10-26 DIAGNOSIS — I340 Nonrheumatic mitral (valve) insufficiency: Secondary | ICD-10-CM

## 2017-10-26 LAB — CBC
HEMATOCRIT: 32.6 % — AB (ref 39.0–52.0)
Hemoglobin: 9.9 g/dL — ABNORMAL LOW (ref 13.0–17.0)
MCH: 28 pg (ref 26.0–34.0)
MCHC: 30.4 g/dL (ref 30.0–36.0)
MCV: 92.4 fL (ref 80.0–100.0)
NRBC: 0 % (ref 0.0–0.2)
Platelets: 203 10*3/uL (ref 150–400)
RBC: 3.53 MIL/uL — AB (ref 4.22–5.81)
RDW: 15.5 % (ref 11.5–15.5)
WBC: 8.8 10*3/uL (ref 4.0–10.5)

## 2017-10-26 LAB — BASIC METABOLIC PANEL
Anion gap: 9 (ref 5–15)
BUN: 34 mg/dL — ABNORMAL HIGH (ref 8–23)
CALCIUM: 9.2 mg/dL (ref 8.9–10.3)
CHLORIDE: 102 mmol/L (ref 98–111)
CO2: 29 mmol/L (ref 22–32)
Creatinine, Ser: 1.26 mg/dL — ABNORMAL HIGH (ref 0.61–1.24)
GFR calc non Af Amer: 49 mL/min — ABNORMAL LOW (ref >60)
GFR, EST AFRICAN AMERICAN: 57 mL/min — AB (ref >60)
Glucose, Bld: 155 mg/dL — ABNORMAL HIGH (ref 70–99)
POTASSIUM: 3.7 mmol/L (ref 3.5–5.1)
Sodium: 140 mmol/L (ref 135–145)

## 2017-10-26 LAB — ECHOCARDIOGRAM COMPLETE
HEIGHTINCHES: 68 in
WEIGHTICAEL: 2627.88 [oz_av]

## 2017-10-26 LAB — MAGNESIUM: Magnesium: 1.8 mg/dL (ref 1.7–2.4)

## 2017-10-26 MED ORDER — FUROSEMIDE 10 MG/ML IJ SOLN
20.0000 mg | Freq: Once | INTRAMUSCULAR | Status: AC
  Administered 2017-10-26: 20 mg via INTRAVENOUS
  Filled 2017-10-26: qty 2

## 2017-10-26 NOTE — Progress Notes (Signed)
PT Cancellation Note  Patient Details Name: Benjamin Patel MRN: [id number] DOB: Apr 15, 1927   Cancelled Treatment:    Reason Eval/Treat Not Completed: Fatigue/lethargy limiting ability to participate Pt eating lunch and requested PT check back.  Upon checking back, pt just back to bed after having BM and too fatigued to participate.  Will check back as schedule permits.   [name],KATHrine E 10/26/2017, 11:57 AM Carmelia Bake, PT, DPT Acute Rehabilitation Services Office: [phone] Pager: [phone]

## 2017-10-26 NOTE — Progress Notes (Signed)
Subjective/Chief Complaint: No acute change. No abdominal pain. Wants to know when he can go home   Objective: Vital signs in last 24 hours: Temp:  [97.3 F (36.3 C)-97.6 F (36.4 C)] 97.6 F (36.4 C) (10/12 0354) Pulse Rate:  [91-124] 115 (10/12 0400) Resp:  [16-22] 19 (10/12 0400) BP: (123-156)/(53-103) 155/72 (10/12 0544) SpO2:  [92 %-98 %] 94 % (10/12 0400) Weight:  [74.5 kg] 74.5 kg (10/12 0417) Last BM Date: 10/26/17  Intake/Output from previous day: 10/11 0701 - 10/12 0700 In: 580 [P.O.:480; IV Piggyback:100] Out: 0  Intake/Output this shift: No intake/output data recorded.  General appearance: alert and cooperative Resp: clear to auscultation bilaterally GI: soft, non-tender; bowel sounds normal; no masses,  no organomegaly Skin: Skin color, texture, turgor normal. No rashes or lesions Neurologic: Grossly normal  Lab Results:  Recent Labs    10/25/17 0318 10/26/17 0326  WBC 10.8* 8.8  HGB 9.7* 9.9*  HCT 31.9* 32.6*  PLT 177 203   BMET Recent Labs    10/25/17 0318 10/26/17 0326  NA 140 140  K 3.7 3.7  CL 105 102  CO2 28 29  GLUCOSE 147* 155*  BUN 40* 34*  CREATININE 1.49* 1.26*  CALCIUM 8.9 9.2   PT/INR Recent Labs    10/23/17 1121  LABPROT 32.7*  INR 3.22   ABG No results for input(s): PHART, HCO3 in the last 72 hours.  Invalid input(s): PCO2, PO2  Studies/Results: Ct Abdomen Pelvis Wo Contrast  Result Date: 10/25/2017 CLINICAL DATA:  Patient has feces in his Foley catheter. Concern for colovesicular fistula. Rectal contrast normal contrast administered. EXAM: CT ABDOMEN AND PELVIS WITHOUT CONTRAST TECHNIQUE: Multidetector CT imaging of the abdomen and pelvis was performed following the standard protocol without IV contrast. COMPARISON:  PET-CT 11/10/2014 FINDINGS: Lower chest: Bilateral pleural effusions moderate in volume. Hepatobiliary: No focal hepatic lesion on noncontrast exam. Gallstones layer within the gallbladder. No  gallbladder distension. Pancreas: Pancreas is normal. No ductal dilatation. No pancreatic inflammation. Spleen: Normal spleen Adrenals/urinary tract: Kidneys are normal. Nonobstructing calculus in lower pole LEFT kidney. Ureters grossly normal and difficult to follow. There is large calculus within the distal LEFT ureter measuring 12 mm (image 60/2). This calculus is approximately 4 cm from the vascular junction identified on PET-CT scan 11/10/2014. Foley catheter within lumen of the bladder. There is moderate volume of gas within the lumen of bladder. Additionally, there is high-density contrast within the bladder which presumably originates from the bowel. The sigmoid colon approximates the dome of the bladder. There is no clear plane between the bladder and sigmoid colon. Several potential communicating surfaces between the sigmoid colon and bladder. There are multiple diverticular through this region. The small bowel also approximates the dome of the bladder. Enteric contrast via oral route progress into the mid small bowel. Stomach/Bowel: Stomach, small-bowel cecum normal. Appendix normal. Contrast progresses to the mid small bowel. The ascending and transverse colon normal. Multiple diverticula of the sigmoid colon. Again the colon approximates the bladder wall. Suspicion for colovesicular fistula. Vascular/Lymphatic: Abdominal aorta is normal caliber with atherosclerotic calcification. There is no retroperitoneal or periportal lymphadenopathy. No pelvic lymphadenopathy. Reproductive: Prostate grossly normal. Small focus of gas within the LEFT aspect of the prostate gland. Other: No free fluid. Musculoskeletal: No aggressive osseous lesion. IMPRESSION: 1. Gas and enteric contrast within the bladder lumen consistent with enterovesicular fistula. Favor colovesicular fistula with the sigmoid colon approximating the bladder wall. Multiple diverticula present through this region of sigmoid colon. The  small bowel  also approximates the dome of the bladder but less favored to be a source of fistula. 2. Chronic large distal LEFT ureteral calculus.  No obstruction. 3. Moderate bilateral pleural effusions. These results will be called to the ordering clinician or representative by the Radiologist Assistant, and communication documented in the PACS or zVision Dashboard. Electronically Signed   By: Suzy Bouchard M.D.   On: 10/25/2017 16:41    Anti-infectives: Anti-infectives (From admission, onward)   Start     Dose/Rate Route Frequency Ordered Stop   10/25/17 2200  cefTRIAXone (ROCEPHIN) 2 g in sodium chloride 0.9 % 100 mL IVPB     2 g 200 mL/hr over 30 Minutes Intravenous Every 24 hours 10/25/17 1008     10/24/17 1000  vancomycin (VANCOCIN) IVPB 1000 mg/200 mL premix  Status:  Discontinued     1,000 mg 200 mL/hr over 60 Minutes Intravenous Every 36 hours 10/23/17 1609 10/24/17 1023   10/23/17 2000  meropenem (MERREM) 1 g in sodium chloride 0.9 % 100 mL IVPB  Status:  Discontinued     1 g 200 mL/hr over 30 Minutes Intravenous Every 12 hours 10/23/17 1633 10/25/17 1000   10/23/17 1145  vancomycin (VANCOCIN) IVPB 1000 mg/200 mL premix     1,000 mg 200 mL/hr over 60 Minutes Intravenous  Once 10/23/17 1140 10/23/17 1507   10/23/17 1130  piperacillin-tazobactam (ZOSYN) IVPB 3.375 g     3.375 g 100 mL/hr over 30 Minutes Intravenous  Once 10/23/17 1120 10/23/17 1200      Assessment/Plan: Colovesical fistula c/b urosepsis- resolving HTN COPD PAD CKD III Carotid stenosis HLD Afib Anticoagulation (eliquis)  Continue supportive care, antibiotic therapy Needs cardiology clearance, would need better rate control and anticoagulation held for surgery if pursued Possible fecal diversion next week Recommend addition of fiber supplement    LOS: 3 days    Clovis Riley 10/26/2017

## 2017-10-26 NOTE — Progress Notes (Signed)
  Echocardiogram 2D Echocardiogram has been performed.  Merrie Roof F 10/26/2017, 4:19 PM

## 2017-10-26 NOTE — Progress Notes (Signed)
Patient ID: Benjamin Patel, male   DOB: 1927-12-27, 84 y.o.   MRN: [id number]    Subjective: Pt continues to feel slightly better each day.  No flank pain.  No fever overnight.  Objective: Vital signs in last 24 hours: Temp:  [97.4 F (36.3 C)-97.9 F (36.6 C)] 97.9 F (36.6 C) (10/12 0820) Pulse Rate:  [91-124] 115 (10/12 0400) Resp:  [16-22] 19 (10/12 0400) BP: (123-156)/(53-103) 155/72 (10/12 0544) SpO2:  [93 %-98 %] 94 % (10/12 0400) Weight:  [74.5 kg] 74.5 kg (10/12 0417)  Intake/Output from previous day: 10/11 0701 - 10/12 0700 In: 580 [P.O.:480; IV Piggyback:100] Out: 0  Intake/Output this shift: No intake/output data recorded.  Physical Exam:  General: Alert and oriented CV: Regular, mildly tachycardic Abdomen: Soft, ND, no CVAT GU: Persistent fecaluria   Lab Results: Recent Labs    10/24/17 0753 10/25/17 0318 10/26/17 0326  HGB 10.0* 9.7* 9.9*  HCT 33.5* 31.9* 32.6*   CBC Latest Ref Rng & Units 10/26/2017 10/25/2017 10/24/2017  WBC 4.0 - 10.5 K/uL 8.8 10.8(H) 24.3(H)  Hemoglobin 13.0 - 17.0 g/dL 9.9(L) 9.7(L) 10.0(L)  Hematocrit 39.0 - 52.0 % 32.6(L) 31.9(L) 33.5(L)  Platelets 150 - 400 K/uL 203 177 223     BMET Recent Labs    10/25/17 0318 10/26/17 0326  NA 140 140  K 3.7 3.7  CL 105 102  CO2 28 29  GLUCOSE 147* 155*  BUN 40* 34*  CREATININE 1.49* 1.26*  CALCIUM 8.9 9.2     Studies/Results:  Assessment/Plan: Probable colovesical fistula:  General surgery has evaluated and believes fecal diversion is likely the best course of action.  I agree that fistula repair with bowel resection and bladder repair would likely be with significant risk in his situation and taking the most palliative approach would be preferable.  Continue broad spectrum IV antibiotics pending bowel diversion.  Left ureteral calculus: Pt remains asymptomatic with stable renal function and no hydronephrosis.  After discussion with patient and his family, will continue to  monitor.  Will discuss further with Dr. Karsten Ro, his primary urologist, as to whether he would consider intervention or stent placement at the time of his colostomy.   LOS: 3 days   Henlee Donovan,LES 10/26/2017, 8:48 AM

## 2017-10-26 NOTE — Progress Notes (Signed)
PROGRESS NOTE    Benjamin Patel  HDQ:[id number] DOB: 12-Dec-1927 DOA: 10/23/2017 PCP: Deland Pretty, MD  Brief Narrative:84 yo male admitted 10/9 with septic shock urosepsis treated with several antibiotics and pressors and culture showed multiple organisms and fecaluria no work-up is going on for colovesical fistula.  He was initially treated with meropenem no change Rocephin as no resistant organisms had been identified.  He is followed by urology.  Urology thinks he probably has a colovesical fistula.  He has been evaluated by general surgery who feels a fecal diversion is likely the best course of action.  [facility] assumed care 10/25/2017 Chest x-ray 10/25/2017 shows new bilateral pleural effusion Assessment & Plan:   Active Problems:   Septic shock (HCC)  1]Colovesical fistula with urosepsis and fecaluria on ceftriaxone cytosis improved WBC back to normal  2] status post sepsis with septic shock currently off pressors  3] diarrhea resolved  4] atrial fibrillation on Cardizem and metoprolol and Eliquis.  5] AKI on CKD Baseline creatinine 1.4-1.5.  It peaked at 1.99 improving with IV hydration.  Creatinine today 1.26  6] diet-controlled type 2 diabetes  7] anemia of chronic disease stable  8] new bilateral pleural effusion by chest x-ray will give a one-time dose of Lasix 20 mg.  I do not see an echocardiogram in record will order an echo.   DVT prophylaxis: Eliquis Code Status DO NOT RESUSCITATE Family Communication: No family available but a friend of his was in the room when I was talking to the patient. Disposition Plan: Pending clinical improvement  Consultantspc: CM, urology  Procedures: None Antimicrobials Rocephin Subjective: Resting in bed feels like breathing is little tired today Objective: Vitals:   10/26/17 0417 10/26/17 0544 10/26/17 0820 10/26/17 1019  BP:  (!) 155/72  (!) 144/81  Pulse:    (!) 108  Resp:      Temp:   97.9 F (36.6 C)   TempSrc:   Oral     SpO2:      Weight: 74.5 kg     Height:        Intake/Output Summary (Last 24 hours) at 10/26/2017 1242 Last data filed at 10/26/2017 0931 Gross per 24 hour  Intake 700 ml  Output 0 ml  Net 700 ml   Filed Weights   10/24/17 0500 10/25/17 0439 10/26/17 0417  Weight: 75.9 kg 79.5 kg 74.5 kg    Examination:  General exam: Appears calm and comfortable  Respiratory system: Scattered rhonchi auscultation. Respiratory effort normal. Cardiovascular system: S1 & S2 heard, RRR. No JVD, murmurs, rubs, gallops or clicks. No pedal edema. Gastrointestinal system: Abdomen is nondistended, soft and nontender. No organomegaly or masses felt. Normal bowel sounds heard. Central nervous system: Alert and oriented. No focal neurological deficits. Extremities: Symmetric 5 x 5 power. Skin: No rashes, lesions or ulcers Psychiatry: Judgement and insight appear normal. Mood & affect appropriate.     Data Reviewed: I have personally reviewed following labs and imaging studies  CBC: Recent Labs  Lab 10/23/17 1121 10/23/17 1126 10/24/17 0753 10/25/17 0318 10/26/17 0326  WBC 18.0*  --  24.3* 10.8* 8.8  NEUTROABS 15.3*  --   --   --   --   HGB 9.0* 8.8* 10.0* 9.7* 9.9*  HCT 29.1* 26.0* 33.5* 31.9* 32.6*  MCV 92.7  --  94.1 92.2 92.4  PLT 184  --  223 177 119   Basic Metabolic Panel: Recent Labs  Lab 10/23/17 1121 10/23/17 1126 10/24/17 0753 10/25/17 0318  10/26/17 0326  NA 136 133* 140 140 140  K 4.6 4.6 4.0 3.7 3.7  CL 97* 94* 104 105 102  CO2 30  --  27 28 29   GLUCOSE 126* 115* 100* 147* 155*  BUN 43* 46* 37* 40* 34*  CREATININE 1.99* 1.80* 1.56* 1.49* 1.26*  CALCIUM 9.0  --  8.6* 8.9 9.2  MG  --   --  1.6*  --  1.8  PHOS  --   --  3.4  --   --    GFR: Estimated Creatinine Clearance: 39.2 mL/min (A) (by C-G formula based on SCr of 1.26 mg/dL (H)). Liver Function Tests: Recent Labs  Lab 10/23/17 1121  AST 26  ALT 15  ALKPHOS 53  BILITOT 0.9  PROT 5.1*  ALBUMIN 2.6*    No results for input(s): LIPASE, AMYLASE in the last 168 hours. No results for input(s): AMMONIA in the last 168 hours. Coagulation Profile: Recent Labs  Lab 10/23/17 1121  INR 3.22   Cardiac Enzymes: No results for input(s): CKTOTAL, CKMB, CKMBINDEX, TROPONINI in the last 168 hours. BNP (last 3 results) No results for input(s): PROBNP in the last 8760 hours. HbA1C: No results for input(s): HGBA1C in the last 72 hours. CBG: Recent Labs  Lab 10/23/17 1126  GLUCAP 117*   Lipid Profile: No results for input(s): CHOL, HDL, LDLCALC, TRIG, CHOLHDL, LDLDIRECT in the last 72 hours. Thyroid Function Tests: No results for input(s): TSH, T4TOTAL, FREET4, T3FREE, THYROIDAB in the last 72 hours. Anemia Panel: No results for input(s): VITAMINB12, FOLATE, FERRITIN, TIBC, IRON, RETICCTPCT in the last 72 hours. Sepsis Labs: Recent Labs  Lab 10/23/17 1121  LATICACIDVEN 3.20*    Recent Results (from the past 240 hour(s))  Culture, blood (Routine x 2)     Status: None (Preliminary result)   Collection Time: 10/23/17 11:05 AM  Result Value Ref Range Status   Specimen Description   Final    BLOOD LEFT ANTECUBITAL Performed at [facility] [address] [place], [place] [address]    Special Requests   Final    BOTTLES DRAWN AEROBIC AND ANAEROBIC Blood Culture adequate volume Performed at [facility] [address], [place], [place] [address]    Culture   Final    NO GROWTH 3 DAYS Performed at [facility], [place] [address], [place], [place] [address]    Report Status PENDING  Incomplete  Culture, blood (Routine x 2)     Status: None (Preliminary result)   Collection Time: 10/23/17 11:13 AM  Result Value Ref Range Status   Specimen Description   Final    BLOOD RIGHT ANTECUBITAL Performed at [facility] [address], [place], [place] [address]    Special Requests   Final    BOTTLES DRAWN AEROBIC AND ANAEROBIC  Blood Culture adequate volume Performed at [facility] [address] [place], [place] [address]    Culture   Final    NO GROWTH 3 DAYS Performed at [facility], [place] [address] [place], [place] [address]    Report Status PENDING  Incomplete  Urine culture     Status: Abnormal   Collection Time: 10/23/17 11:21 AM  Result Value Ref Range Status   Specimen Description   Final    URINE, CATHETERIZED Performed at [facility] [address], [place], [place] [address]    Special Requests   Final    NONE Performed  at [facility], [place] [address] [place], [place] [address]    Culture MULTIPLE SPECIES PRESENT, SUGGEST RECOLLECTION (A)  Final   Report Status 10/25/2017 FINAL  Final  MRSA PCR Screening     Status: None   Collection Time: 10/23/17  3:59 PM  Result Value Ref Range Status   MRSA by PCR NEGATIVE NEGATIVE Final    Comment:        The GeneXpert MRSA Assay (FDA approved for NASAL specimens only), is one component of a comprehensive MRSA colonization surveillance program. It is not intended to diagnose MRSA infection nor to guide or monitor treatment for MRSA infections. Performed at [facility], [place] [address] [place], [place] [address]          Radiology Studies: Ct Abdomen Pelvis Wo Contrast  Result Date: 10/25/2017 CLINICAL DATA:  Patient has feces in his Foley catheter. Concern for colovesicular fistula. Rectal contrast normal contrast administered. EXAM: CT ABDOMEN AND PELVIS WITHOUT CONTRAST TECHNIQUE: Multidetector CT imaging of the abdomen and pelvis was performed following the standard protocol without IV contrast. COMPARISON:  PET-CT 11/10/2014 FINDINGS: Lower chest: Bilateral pleural effusions moderate in volume. Hepatobiliary: No focal hepatic lesion on noncontrast exam. Gallstones layer within the gallbladder. No gallbladder distension. Pancreas: Pancreas is normal.  No ductal dilatation. No pancreatic inflammation. Spleen: Normal spleen Adrenals/urinary tract: Kidneys are normal. Nonobstructing calculus in lower pole LEFT kidney. Ureters grossly normal and difficult to follow. There is large calculus within the distal LEFT ureter measuring 12 mm (image 60/2). This calculus is approximately 4 cm from the vascular junction identified on PET-CT scan 11/10/2014. Foley catheter within lumen of the bladder. There is moderate volume of gas within the lumen of bladder. Additionally, there is high-density contrast within the bladder which presumably originates from the bowel. The sigmoid colon approximates the dome of the bladder. There is no clear plane between the bladder and sigmoid colon. Several potential communicating surfaces between the sigmoid colon and bladder. There are multiple diverticular through this region. The small bowel also approximates the dome of the bladder. Enteric contrast via oral route progress into the mid small bowel. Stomach/Bowel: Stomach, small-bowel cecum normal. Appendix normal. Contrast progresses to the mid small bowel. The ascending and transverse colon normal. Multiple diverticula of the sigmoid colon. Again the colon approximates the bladder wall. Suspicion for colovesicular fistula. Vascular/Lymphatic: Abdominal aorta is normal caliber with atherosclerotic calcification. There is no retroperitoneal or periportal lymphadenopathy. No pelvic lymphadenopathy. Reproductive: Prostate grossly normal. Small focus of gas within the LEFT aspect of the prostate gland. Other: No free fluid. Musculoskeletal: No aggressive osseous lesion. IMPRESSION: 1. Gas and enteric contrast within the bladder lumen consistent with enterovesicular fistula. Favor colovesicular fistula with the sigmoid colon approximating the bladder wall. Multiple diverticula present through this region of sigmoid colon. The small bowel also approximates the dome of the bladder but less  favored to be a source of fistula. 2. Chronic large distal LEFT ureteral calculus.  No obstruction. 3. Moderate bilateral pleural effusions. These results will be called to the ordering clinician or representative by the Radiologist Assistant, and communication documented in the PACS or zVision Dashboard. Electronically Signed   By: Suzy Bouchard M.D.   On: 10/25/2017 16:41   Dg Chest 1 View  Result Date: 10/26/2017 CLINICAL DATA:  Cough and shortness of breath EXAM: CHEST  1 VIEW COMPARISON:  10/23/2017 FINDINGS: Cardiac shadow is stable. Aortic calcifications are again seen. New bilateral pleural effusions are noted  with likely underlying atelectasis. No bony abnormality is seen. IMPRESSION: New bilateral pleural effusions. Electronically Signed   By: Inez Catalina M.D.   On: 10/26/2017 11:23        Scheduled Meds: . apixaban  5 mg Oral BID  . chlorhexidine  15 mL Mouth Rinse BID  . diltiazem  60 mg Oral Q8H  . mouth rinse  15 mL Mouth Rinse q12n4p  . metoprolol tartrate  50 mg Oral BID   Continuous Infusions: . cefTRIAXone (ROCEPHIN)  IV Stopped (10/25/17 2224)     LOS: 3 days     Georgette Shell, MD [facility]  If 7PM-7AM, please contact night-coverage [email or web address] Password [email or web address] 10/26/2017, 12:42 PM

## 2017-10-27 DIAGNOSIS — R0602 Shortness of breath: Secondary | ICD-10-CM

## 2017-10-27 LAB — BASIC METABOLIC PANEL
ANION GAP: 10 (ref 5–15)
BUN: 32 mg/dL — AB (ref 8–23)
CO2: 28 mmol/L (ref 22–32)
Calcium: 9.3 mg/dL (ref 8.9–10.3)
Chloride: 106 mmol/L (ref 98–111)
Creatinine, Ser: 1.12 mg/dL (ref 0.61–1.24)
GFR calc Af Amer: >60 mL/min (ref >60)
GFR, EST NON AFRICAN AMERICAN: 57 mL/min — AB (ref >60)
GLUCOSE: 146 mg/dL — AB (ref 70–99)
POTASSIUM: 3.9 mmol/L (ref 3.5–5.1)
Sodium: 144 mmol/L (ref 135–145)

## 2017-10-27 LAB — CBC
HEMATOCRIT: 33.7 % — AB (ref 39.0–52.0)
Hemoglobin: 10 g/dL — ABNORMAL LOW (ref 13.0–17.0)
MCH: 28.2 pg (ref 26.0–34.0)
MCHC: 29.7 g/dL — ABNORMAL LOW (ref 30.0–36.0)
MCV: 94.9 fL (ref 80.0–100.0)
NRBC: 0 % (ref 0.0–0.2)
Platelets: 243 10*3/uL (ref 150–400)
RBC: 3.55 MIL/uL — AB (ref 4.22–5.81)
RDW: 15.5 % (ref 11.5–15.5)
WBC: 8.4 10*3/uL (ref 4.0–10.5)

## 2017-10-27 LAB — GLUCOSE, CAPILLARY
Glucose-Capillary: 128 mg/dL — ABNORMAL HIGH (ref 70–99)
Glucose-Capillary: 136 mg/dL — ABNORMAL HIGH (ref 70–99)

## 2017-10-27 MED ORDER — CALCIUM POLYCARBOPHIL 625 MG PO TABS
625.0000 mg | ORAL_TABLET | Freq: Every day | ORAL | Status: DC
  Administered 2017-10-27 – 2017-10-30 (×4): 625 mg via ORAL
  Filled 2017-10-27 (×4): qty 1

## 2017-10-27 MED ORDER — SODIUM CHLORIDE 0.9 % IV SOLN
INTRAVENOUS | Status: DC | PRN
  Administered 2017-10-27: 250 mL via INTRAVENOUS

## 2017-10-27 MED ORDER — MORPHINE SULFATE (PF) 2 MG/ML IV SOLN
1.0000 mg | Freq: Once | INTRAVENOUS | Status: AC | PRN
  Administered 2017-10-29: 1 mg via INTRAVENOUS
  Filled 2017-10-27: qty 1

## 2017-10-27 NOTE — Progress Notes (Signed)
Subjective/Chief Complaint: No acute change. No abdominal pain.  Objective: Vital signs in last 24 hours: Temp:  [97.5 F (36.4 C)-98 F (36.7 C)] 98 F (36.7 C) (10/13 0400) Pulse Rate:  [86-126] 100 (10/13 0400) Resp:  [15-28] 28 (10/13 0400) BP: (91-150)/(59-108) 150/75 (10/13 0514) SpO2:  [87 %-100 %] 100 % (10/13 0400) Weight:  [72.4 kg] 72.4 kg (10/13 0512) Last BM Date: 10/26/17  Intake/Output from previous day: 10/12 0701 - 10/13 0700 In: 460 [P.O.:360; IV Piggyback:100] Out: -  Intake/Output this shift: No intake/output data recorded.  General appearance: alert and cooperative Resp: clear to auscultation bilaterally GI: soft, non-tender; bowel sounds normal; no masses,  no organomegaly Skin: Skin color, texture, turgor normal. No rashes or lesions Neurologic: Grossly normal Foley with feculent output  Lab Results:  Recent Labs    10/26/17 0326 10/27/17 0304  WBC 8.8 8.4  HGB 9.9* 10.0*  HCT 32.6* 33.7*  PLT 203 243   BMET Recent Labs    10/26/17 0326 10/27/17 0304  NA 140 144  K 3.7 3.9  CL 102 106  CO2 29 28  GLUCOSE 155* 146*  BUN 34* 32*  CREATININE 1.26* 1.12  CALCIUM 9.2 9.3   PT/INR No results for input(s): LABPROT, INR in the last 72 hours. ABG No results for input(s): PHART, HCO3 in the last 72 hours.  Invalid input(s): PCO2, PO2  Studies/Results: Ct Abdomen Pelvis Wo Contrast  Result Date: 10/25/2017 CLINICAL DATA:  Patient has feces in his Foley catheter. Concern for colovesicular fistula. Rectal contrast normal contrast administered. EXAM: CT ABDOMEN AND PELVIS WITHOUT CONTRAST TECHNIQUE: Multidetector CT imaging of the abdomen and pelvis was performed following the standard protocol without IV contrast. COMPARISON:  PET-CT 11/10/2014 FINDINGS: Lower chest: Bilateral pleural effusions moderate in volume. Hepatobiliary: No focal hepatic lesion on noncontrast exam. Gallstones layer within the gallbladder. No gallbladder distension.  Pancreas: Pancreas is normal. No ductal dilatation. No pancreatic inflammation. Spleen: Normal spleen Adrenals/urinary tract: Kidneys are normal. Nonobstructing calculus in lower pole LEFT kidney. Ureters grossly normal and difficult to follow. There is large calculus within the distal LEFT ureter measuring 12 mm (image 60/2). This calculus is approximately 4 cm from the vascular junction identified on PET-CT scan 11/10/2014. Foley catheter within lumen of the bladder. There is moderate volume of gas within the lumen of bladder. Additionally, there is high-density contrast within the bladder which presumably originates from the bowel. The sigmoid colon approximates the dome of the bladder. There is no clear plane between the bladder and sigmoid colon. Several potential communicating surfaces between the sigmoid colon and bladder. There are multiple diverticular through this region. The small bowel also approximates the dome of the bladder. Enteric contrast via oral route progress into the mid small bowel. Stomach/Bowel: Stomach, small-bowel cecum normal. Appendix normal. Contrast progresses to the mid small bowel. The ascending and transverse colon normal. Multiple diverticula of the sigmoid colon. Again the colon approximates the bladder wall. Suspicion for colovesicular fistula. Vascular/Lymphatic: Abdominal aorta is normal caliber with atherosclerotic calcification. There is no retroperitoneal or periportal lymphadenopathy. No pelvic lymphadenopathy. Reproductive: Prostate grossly normal. Small focus of gas within the LEFT aspect of the prostate gland. Other: No free fluid. Musculoskeletal: No aggressive osseous lesion. IMPRESSION: 1. Gas and enteric contrast within the bladder lumen consistent with enterovesicular fistula. Favor colovesicular fistula with the sigmoid colon approximating the bladder wall. Multiple diverticula present through this region of sigmoid colon. The small bowel also approximates the dome  of the bladder  but less favored to be a source of fistula. 2. Chronic large distal LEFT ureteral calculus.  No obstruction. 3. Moderate bilateral pleural effusions. These results will be called to the ordering clinician or representative by the Radiologist Assistant, and communication documented in the PACS or zVision Dashboard. Electronically Signed   By: Suzy Bouchard M.D.   On: 10/25/2017 16:41   Dg Chest 1 View  Result Date: 10/26/2017 CLINICAL DATA:  Cough and shortness of breath EXAM: CHEST  1 VIEW COMPARISON:  10/23/2017 FINDINGS: Cardiac shadow is stable. Aortic calcifications are again seen. New bilateral pleural effusions are noted with likely underlying atelectasis. No bony abnormality is seen. IMPRESSION: New bilateral pleural effusions. Electronically Signed   By: Inez Catalina M.D.   On: 10/26/2017 11:23    Anti-infectives: Anti-infectives (From admission, onward)   Start     Dose/Rate Route Frequency Ordered Stop   10/25/17 2200  cefTRIAXone (ROCEPHIN) 2 g in sodium chloride 0.9 % 100 mL IVPB     2 g 200 mL/hr over 30 Minutes Intravenous Every 24 hours 10/25/17 1008     10/24/17 1000  vancomycin (VANCOCIN) IVPB 1000 mg/200 mL premix  Status:  Discontinued     1,000 mg 200 mL/hr over 60 Minutes Intravenous Every 36 hours 10/23/17 1609 10/24/17 1023   10/23/17 2000  meropenem (MERREM) 1 g in sodium chloride 0.9 % 100 mL IVPB  Status:  Discontinued     1 g 200 mL/hr over 30 Minutes Intravenous Every 12 hours 10/23/17 1633 10/25/17 1000   10/23/17 1145  vancomycin (VANCOCIN) IVPB 1000 mg/200 mL premix     1,000 mg 200 mL/hr over 60 Minutes Intravenous  Once 10/23/17 1140 10/23/17 1507   10/23/17 1130  piperacillin-tazobactam (ZOSYN) IVPB 3.375 g     3.375 g 100 mL/hr over 30 Minutes Intravenous  Once 10/23/17 1120 10/23/17 1200      Assessment/Plan: Colovesical fistula c/b urosepsis- resolving HTN COPD PAD CKD III Carotid stenosis HLD Afib Anticoagulation  (eliquis)  Continue supportive care, antibiotic therapy Needs cardiology clearance, would need better rate control and anticoagulation held for surgery if pursued Please change anticoagulation to heparin drip in anticipation of surgery this week Plan fecal diversion next week Recommend addition of fiber supplement- fibercon added    LOS: 4 days    Clovis Riley 10/27/2017

## 2017-10-27 NOTE — Progress Notes (Signed)
PROGRESS NOTE    Benjamin Patel  ZOX:[id number] DOB: [birth year] DOA: 10/23/2017 PCP: Deland Pretty, MD   Brief Narrative:84 yo male admitted 10/9 with septic shock urosepsis treated with several antibiotics and pressors and culture showed multiple organisms and fecaluria no work-up is going on for colovesical fistula.  He was initially treated with meropenem no change Rocephin as no resistant organisms had been identified.  He is followed by urology.  Urology thinks he probably has a colovesical fistula.  He has been evaluated by general surgery who feels a fecal diversion is likely the best course of action.  [facility] assumed care 10/25/2017 Chest x-ray 10/25/2017 shows new bilateral pleural effusion  Assessment & Plan:   Active Problems:   Septic shock (HCC)   Hypotension   Fever   Urinary tract infection without hematuria   1]Colovesical fistula with urosepsis and fecaluria on ceftriaxoneleuko cytosis improved WBC back to normal.cardoilogy to see him tomorrow for clearence.  2] status post sepsis with septic shock currently off pressors  3] diarrhea resolved  4] atrial fibrillation on Cardizem and metoprolol and Eliquis.  5] AKI on CKD Baseline creatinine 1.4-1.5.  It peaked at 1.99 improving with IV hydration.  Creatinine today 1.12  6] diet-controlled type 2 diabetes stable  7] anemia of chronic disease stable  8] new bilateral pleural effusion by chest x-ray will give a one-time dose of Lasix 20 mg.  I do not see an echocardiogram in record will order an echo.    DVT prophylaxis eliquis Code Status:dnr Family Communication dw wife Disposition Plan:  tbd Consultants:  [facility] urology  Procedures:none Antimicrobials: rocephin  Subjective:  Sitting up in chair says feels tired  Objective: Vitals:   10/27/17 0512 10/27/17 0514 10/27/17 0737 10/27/17 0800  BP:  (!) 150/75  138/64  Pulse:    95  Resp:    13  Temp:   (!) 97.3 F (36.3 C)   TempSrc:   Oral     SpO2:    98%  Weight: 72.4 kg     Height:        Intake/Output Summary (Last 24 hours) at 10/27/2017 1154 Last data filed at 10/27/2017 0944 Gross per 24 hour  Intake 460 ml  Output -  Net 460 ml   Filed Weights   10/25/17 0439 10/26/17 0417 10/27/17 0512  Weight: 79.5 kg 74.5 kg 72.4 kg    Examination: Frail elderly male General exam: Appears calm and comfortable  Respiratory system: Clear to auscultation. Respiratory effort normal. Cardiovascular system: S1 & S2 heard, RRR. No JVD, murmurs, rubs, gallops or clicks. No pedal edema. Gastrointestinal system: Abdomen is nondistended, soft and nontender. No organomegaly or masses felt. Normal bowel sounds heard. Central nervous system: Alert and oriented. No focal neurological deficits. Extremities: Symmetric 5 x 5 power. Skin: No rashes, lesions or ulcers Psychiatry: Judgement and insight appear normal. Mood & affect appropriate.     Data Reviewed: I have personally reviewed following labs and imaging studies  CBC: Recent Labs  Lab 10/23/17 1121 10/23/17 1126 10/24/17 0753 10/25/17 0318 10/26/17 0326 10/27/17 0304  WBC 18.0*  --  24.3* 10.8* 8.8 8.4  NEUTROABS 15.3*  --   --   --   --   --   HGB 9.0* 8.8* 10.0* 9.7* 9.9* 10.0*  HCT 29.1* 26.0* 33.5* 31.9* 32.6* 33.7*  MCV 92.7  --  94.1 92.2 92.4 94.9  PLT 184  --  223 177 203 811   Basic Metabolic Panel:  Recent Labs  Lab 10/23/17 1121 10/23/17 1126 10/24/17 0753 10/25/17 0318 10/26/17 0326 10/27/17 0304  NA 136 133* 140 140 140 144  K 4.6 4.6 4.0 3.7 3.7 3.9  CL 97* 94* 104 105 102 106  CO2 30  --  27 28 29 28   GLUCOSE 126* 115* 100* 147* 155* 146*  BUN 43* 46* 37* 40* 34* 32*  CREATININE 1.99* 1.80* 1.56* 1.49* 1.26* 1.12  CALCIUM 9.0  --  8.6* 8.9 9.2 9.3  MG  --   --  1.6*  --  1.8  --   PHOS  --   --  3.4  --   --   --    GFR: Estimated Creatinine Clearance: 44.1 mL/min (by C-G formula based on SCr of 1.12 mg/dL). Liver Function  Tests: Recent Labs  Lab 10/23/17 1121  AST 26  ALT 15  ALKPHOS 53  BILITOT 0.9  PROT 5.1*  ALBUMIN 2.6*   No results for input(s): LIPASE, AMYLASE in the last 168 hours. No results for input(s): AMMONIA in the last 168 hours. Coagulation Profile: Recent Labs  Lab 10/23/17 1121  INR 3.22   Cardiac Enzymes: No results for input(s): CKTOTAL, CKMB, CKMBINDEX, TROPONINI in the last 168 hours. BNP (last 3 results) No results for input(s): PROBNP in the last 8760 hours. HbA1C: No results for input(s): HGBA1C in the last 72 hours. CBG: Recent Labs  Lab 10/23/17 1126  GLUCAP 117*   Lipid Profile: No results for input(s): CHOL, HDL, LDLCALC, TRIG, CHOLHDL, LDLDIRECT in the last 72 hours. Thyroid Function Tests: No results for input(s): TSH, T4TOTAL, FREET4, T3FREE, THYROIDAB in the last 72 hours. Anemia Panel: No results for input(s): VITAMINB12, FOLATE, FERRITIN, TIBC, IRON, RETICCTPCT in the last 72 hours. Sepsis Labs: Recent Labs  Lab 10/23/17 1121  LATICACIDVEN 3.20*    Recent Results (from the past 240 hour(s))  Culture, blood (Routine x 2)     Status: None (Preliminary result)   Collection Time: 10/23/17 11:05 AM  Result Value Ref Range Status   Specimen Description   Final    BLOOD LEFT ANTECUBITAL Performed at [facility] [address] [place], [place] [address]    Special Requests   Final    BOTTLES DRAWN AEROBIC AND ANAEROBIC Blood Culture adequate volume Performed at [facility] [address], [place], [place] [address]    Culture   Final    NO GROWTH 4 DAYS Performed at [facility], [place] [address], [place], [place] [address]    Report Status PENDING  Incomplete  Culture, blood (Routine x 2)     Status: None (Preliminary result)   Collection Time: 10/23/17 11:13 AM  Result Value Ref Range Status   Specimen Description   Final    BLOOD RIGHT ANTECUBITAL Performed at [facility] [address] [place], [place] [address]    Special Requests   Final    BOTTLES DRAWN AEROBIC AND ANAEROBIC Blood Culture adequate volume Performed at [facility] [address], [place], [place] [address]    Culture   Final    NO GROWTH 4 DAYS Performed at [facility], [place] [address], [place], [place] [address]    Report Status PENDING  Incomplete  Urine culture     Status: Abnormal   Collection Time: 10/23/17 11:21 AM  Result Value Ref Range Status   Specimen Description   Final    URINE, CATHETERIZED  Performed at [facility], [place] [address], [place], [place] [address]    Special Requests   Final    NONE Performed at [facility], [place] [address], [place], [place] [address]    Culture MULTIPLE SPECIES PRESENT, SUGGEST RECOLLECTION (A)  Final   Report Status 10/25/2017 FINAL  Final  MRSA PCR Screening     Status: None   Collection Time: 10/23/17  3:59 PM  Result Value Ref Range Status   MRSA by PCR NEGATIVE NEGATIVE Final    Comment:        The GeneXpert MRSA Assay (FDA approved for NASAL specimens only), is one component of a comprehensive MRSA colonization surveillance program. It is not intended to diagnose MRSA infection nor to guide or monitor treatment for MRSA infections. Performed at [facility], [place] [address], [place], [place] [address]          Radiology Studies: Ct Abdomen Pelvis Wo Contrast  Result Date: 10/25/2017 CLINICAL DATA:  Patient has feces in his Foley catheter. Concern for colovesicular fistula. Rectal contrast normal contrast administered. EXAM: CT ABDOMEN AND PELVIS WITHOUT CONTRAST TECHNIQUE: Multidetector CT imaging of the abdomen and pelvis was performed following the standard protocol without IV contrast. COMPARISON:  PET-CT 11/10/2014 FINDINGS: Lower chest: Bilateral pleural effusions moderate in volume. Hepatobiliary: No focal hepatic lesion on  noncontrast exam. Gallstones layer within the gallbladder. No gallbladder distension. Pancreas: Pancreas is normal. No ductal dilatation. No pancreatic inflammation. Spleen: Normal spleen Adrenals/urinary tract: Kidneys are normal. Nonobstructing calculus in lower pole LEFT kidney. Ureters grossly normal and difficult to follow. There is large calculus within the distal LEFT ureter measuring 12 mm (image 60/2). This calculus is approximately 4 cm from the vascular junction identified on PET-CT scan 11/10/2014. Foley catheter within lumen of the bladder. There is moderate volume of gas within the lumen of bladder. Additionally, there is high-density contrast within the bladder which presumably originates from the bowel. The sigmoid colon approximates the dome of the bladder. There is no clear plane between the bladder and sigmoid colon. Several potential communicating surfaces between the sigmoid colon and bladder. There are multiple diverticular through this region. The small bowel also approximates the dome of the bladder. Enteric contrast via oral route progress into the mid small bowel. Stomach/Bowel: Stomach, small-bowel cecum normal. Appendix normal. Contrast progresses to the mid small bowel. The ascending and transverse colon normal. Multiple diverticula of the sigmoid colon. Again the colon approximates the bladder wall. Suspicion for colovesicular fistula. Vascular/Lymphatic: Abdominal aorta is normal caliber with atherosclerotic calcification. There is no retroperitoneal or periportal lymphadenopathy. No pelvic lymphadenopathy. Reproductive: Prostate grossly normal. Small focus of gas within the LEFT aspect of the prostate gland. Other: No free fluid. Musculoskeletal: No aggressive osseous lesion. IMPRESSION: 1. Gas and enteric contrast within the bladder lumen consistent with enterovesicular fistula. Favor colovesicular fistula with the sigmoid colon approximating the bladder wall. Multiple diverticula  present through this region of sigmoid colon. The small bowel also approximates the dome of the bladder but less favored to be a source of fistula. 2. Chronic large distal LEFT ureteral calculus.  No obstruction. 3. Moderate bilateral pleural effusions. These results will be called to the ordering clinician or representative by the Radiologist Assistant, and communication documented in the PACS or zVision Dashboard. Electronically Signed   By: Suzy Bouchard M.D.   On: 10/25/2017 16:41   Dg Chest 1 View  Result Date: 10/26/2017 CLINICAL DATA:  Cough and shortness  of breath EXAM: CHEST  1 VIEW COMPARISON:  10/23/2017 FINDINGS: Cardiac shadow is stable. Aortic calcifications are again seen. New bilateral pleural effusions are noted with likely underlying atelectasis. No bony abnormality is seen. IMPRESSION: New bilateral pleural effusions. Electronically Signed   By: Inez Catalina M.D.   On: 10/26/2017 11:23        Scheduled Meds: . apixaban  5 mg Oral BID  . chlorhexidine  15 mL Mouth Rinse BID  . diltiazem  60 mg Oral Q8H  . mouth rinse  15 mL Mouth Rinse q12n4p  . metoprolol tartrate  50 mg Oral BID  . polycarbophil  625 mg Oral Daily   Continuous Infusions: . cefTRIAXone (ROCEPHIN)  IV Stopped (10/26/17 2305)     LOS: 4 days     Georgette Shell, MD [facility]  If 7PM-7AM, please contact night-coverage [email or web address] Password [email or web address] 10/27/2017, 11:54 AM

## 2017-10-27 NOTE — Evaluation (Signed)
Physical Therapy Evaluation Patient Details Name: Benjamin Patel MRN: [id number] DOB: 1927/05/02 Today's Date: 10/27/2017   History of Present Illness  83 YO male admitted 10/24/14 with urosepsis, colorvesicle fistula, for planned colostomy, aND left ureteral  CALCULI, . H/O PVCs,, DM, COPD, Lung cancer.  Clinical Impression  Patient  Stood  From recliner and then ambulated x 40' patient ambulated on RA withnsats drop to 83%. Replaced )2 at 2 liters immediately. Hr 110-127. Patient is scheduled for colostomy prior to DC. Dc plan will need to be updated post op as necessary , depending on functional level. Pt admitted with above diagnosis. Pt currently with functional limitations due to the deficits listed below (see PT Problem List).  Pt will benefit from skilled PT to increase their independence and safety with mobility to allow discharge to the venue listed below.       Follow Up Recommendations [facility]    Equipment Recommendations  None recommended by PT    Recommendations for Other Services OT consult     Precautions / Restrictions Precautions Precautions: Fall Precaution Comments: monitor sats on O2, desatted on RA Restrictions Weight Bearing Restrictions: No      Mobility  Bed Mobility               General bed mobility comments: in recliner  Transfers Overall transfer level: Needs assistance Equipment used: Rolling walker (2 wheeled) Transfers: Sit to/from Stand Sit to Stand: Mod assist         General transfer comment: power assist to stand from recliner, cues for hand placement  Ambulation/Gait Ambulation/Gait assistance: Mod assist;+2 physical assistance;+2 safety/equipment;Min assist Gait Distance (Feet): 40 Feet Assistive device: Rolling walker (2 wheeled) Gait Pattern/deviations: Step-through pattern;Trunk flexed     General Gait Details: patient ambulated with min to mod steady assist for rurning. Patient on RA trial with sats tto 83%. Replaced  to 2 liters with sats increasing  Stairs            Wheelchair Mobility    Modified Rankin (Stroke Patients Only)       Balance Overall balance assessment: Needs assistance Sitting-balance support: No upper extremity supported;Feet supported Sitting balance-Leahy Scale: Good     Standing balance support: During functional activity;Bilateral upper extremity supported Standing balance-Leahy Scale: Fair                               Pertinent Vitals/Pain Pain Assessment: No/denies pain    Home Living Family/patient expects to be discharged to:: Private residence Living Arrangements: Spouse/significant other Available Help at Discharge: Family Type of Home: House Home Access: Stairs to enter   Technical brewer of Steps: 2 Home Layout: Two level   Additional Comments: tbd    Prior Function Level of Independence: Independent               Hand Dominance        Extremity/Trunk Assessment   Upper Extremity Assessment Upper Extremity Assessment: Generalized weakness    Lower Extremity Assessment Lower Extremity Assessment: Generalized weakness    Cervical / Trunk Assessment Cervical / Trunk Assessment: Kyphotic  Communication   Communication: No difficulties  Cognition Arousal/Alertness: Awake/alert Behavior During Therapy: WFL for tasks assessed/performed Overall Cognitive Status: Impaired/Different from baseline Area of Impairment: Orientation, not certain of his understanding of medical as he is hoping to go home soon.  Orientation Level: Disoriented to;Time             General Comments: was off by 1 day      General Comments      Exercises     Assessment/Plan    PT Assessment Patient needs continued PT services  PT Problem List Decreased strength;Decreased activity tolerance;Decreased mobility;Decreased knowledge of precautions;Decreased safety awareness;Decreased knowledge of use of  DME;Cardiopulmonary status limiting activity       PT Treatment Interventions DME instruction;Therapeutic exercise;Gait training;Stair training;Functional mobility training;Therapeutic activities;Patient/family education    PT Goals (Current goals can be found in the Care Plan section)  Acute Rehab PT Goals Patient Stated Goal: to go home PT Goal Formulation: With patient/family Time For Goal Achievement: 11/10/17 Potential to Achieve Goals: Fair    Frequency Min 3X/week   Barriers to discharge        Co-evaluation               AM-PAC PT "6 Clicks" Daily Activity  Outcome Measure Difficulty turning over in bed (including adjusting bedclothes, sheets and blankets)?: Unable Difficulty moving from lying on back to sitting on the side of the bed? : Unable Difficulty sitting down on and standing up from a chair with arms (e.g., wheelchair, bedside commode, etc,.)?: A Lot Help needed moving to and from a bed to chair (including a wheelchair)?: A Lot Help needed walking in hospital room?: Total Help needed climbing 3-5 steps with a railing? : Total 6 Click Score: 8    End of Session   Activity Tolerance: Patient tolerated treatment well Patient left: with call bell/phone within reach;with chair alarm set;with family/visitor present;with nursing/sitter in room Nurse Communication: Mobility status PT Visit Diagnosis: Unsteadiness on feet (R26.81)    Time: 6333-5456 PT Time Calculation (min) (ACUTE ONLY): 23 min   Charges:   PT Evaluation $PT Eval Low Complexity: 1 Low PT Treatments $Gait Training: 8-22 mins        Tresa Endo PT Acute Rehabilitation Services Pager [phone] Office [phone]   Claretha Cooper 10/27/2017, 10:07 AM

## 2017-10-27 NOTE — Progress Notes (Signed)
Patient ID: Benjamin Patel, male   DOB: Oct 25, 1927, 84 y.o.   MRN: [id number]    Subjective: Pt continues to feel slightly better each day.  Still weak overall.  Denies left flank pain.  Objective: Vital signs in last 24 hours: Temp:  [97.5 F (36.4 C)-98 F (36.7 C)] 98 F (36.7 C) (10/13 0400) Pulse Rate:  [86-126] 100 (10/13 0400) Resp:  [15-28] 28 (10/13 0400) BP: (91-150)/(59-108) 150/75 (10/13 0514) SpO2:  [87 %-100 %] 100 % (10/13 0400) Weight:  [72.4 kg] 72.4 kg (10/13 0512)  Intake/Output from previous day: 10/12 0701 - 10/13 0700 In: 460 [P.O.:360; IV Piggyback:100] Out: -  Intake/Output this shift: No intake/output data recorded.  Physical Exam:  General: Alert and oriented Abd: No CVAT  Lab Results: Recent Labs    10/25/17 0318 10/26/17 0326 10/27/17 0304  HGB 9.7* 9.9* 10.0*  HCT 31.9* 32.6* 33.7*   CBC Latest Ref Rng & Units 10/27/2017 10/26/2017 10/25/2017  WBC 4.0 - 10.5 K/uL 8.4 8.8 10.8(H)  Hemoglobin 13.0 - 17.0 g/dL 10.0(L) 9.9(L) 9.7(L)  Hematocrit 39.0 - 52.0 % 33.7(L) 32.6(L) 31.9(L)  Platelets 150 - 400 K/uL 243 203 177     BMET Recent Labs    10/26/17 0326 10/27/17 0304  NA 140 144  K 3.7 3.9  CL 102 106  CO2 29 28  GLUCOSE 155* 146*  BUN 34* 32*  CREATININE 1.26* 1.12  CALCIUM 9.2 9.3     Studies/Results:   Assessment/Plan: 1) Colovesical fistula: Continue empiric antibiotic therapy for polymicrobial infection pending colostomy.  Can probably stop antibiotics after 10-14 days if clinically continues to improve.  2) Large left ureteral calculus: He remains asymptomatic without hydronephrosis, fever or worsening renal function.  This very well may be chronic and in absence of absolute indication for urgent intervention, patient and family have wished to continue with observation.  Will leave decision to address stone at time of colostomy to Dr. Simone Curia discretion.   LOS: 4 days   Benjamin Patel,LES 10/27/2017, 7:29 AM

## 2017-10-28 DIAGNOSIS — Z01810 Encounter for preprocedural cardiovascular examination: Secondary | ICD-10-CM

## 2017-10-28 LAB — BASIC METABOLIC PANEL
Anion gap: 11 (ref 5–15)
BUN: 28 mg/dL — AB (ref 8–23)
CALCIUM: 9.4 mg/dL (ref 8.9–10.3)
CO2: 30 mmol/L (ref 22–32)
CREATININE: 0.94 mg/dL (ref 0.61–1.24)
Chloride: 106 mmol/L (ref 98–111)
GFR calc non Af Amer: >60 mL/min (ref >60)
Glucose, Bld: 135 mg/dL — ABNORMAL HIGH (ref 70–99)
Potassium: 3.7 mmol/L (ref 3.5–5.1)
SODIUM: 147 mmol/L — AB (ref 135–145)

## 2017-10-28 LAB — APTT
aPTT: 39 seconds — ABNORMAL HIGH (ref 24–36)
aPTT: 110 seconds — ABNORMAL HIGH (ref 24–36)

## 2017-10-28 LAB — CBC
HCT: 32.8 % — ABNORMAL LOW (ref 39.0–52.0)
Hemoglobin: 9.9 g/dL — ABNORMAL LOW (ref 13.0–17.0)
MCH: 28.1 pg (ref 26.0–34.0)
MCHC: 30.2 g/dL (ref 30.0–36.0)
MCV: 93.2 fL (ref 80.0–100.0)
Platelets: 224 10*3/uL (ref 150–400)
RBC: 3.52 MIL/uL — ABNORMAL LOW (ref 4.22–5.81)
RDW: 15.7 % — AB (ref 11.5–15.5)
WBC: 7 10*3/uL (ref 4.0–10.5)
nRBC: 0 % (ref 0.0–0.2)

## 2017-10-28 LAB — HEPARIN LEVEL (UNFRACTIONATED)

## 2017-10-28 LAB — GLUCOSE, CAPILLARY: Glucose-Capillary: 145 mg/dL — ABNORMAL HIGH (ref 70–99)

## 2017-10-28 MED ORDER — POTASSIUM CHLORIDE CRYS ER 20 MEQ PO TBCR
20.0000 meq | EXTENDED_RELEASE_TABLET | Freq: Once | ORAL | Status: AC
  Administered 2017-10-28: 20 meq via ORAL
  Filled 2017-10-28: qty 1

## 2017-10-28 MED ORDER — METOPROLOL TARTRATE 25 MG PO TABS
75.0000 mg | ORAL_TABLET | Freq: Two times a day (BID) | ORAL | Status: DC
  Administered 2017-10-28 – 2017-10-30 (×5): 75 mg via ORAL
  Filled 2017-10-28 (×2): qty 3
  Filled 2017-10-28: qty 6
  Filled 2017-10-28 (×3): qty 3

## 2017-10-28 MED ORDER — FUROSEMIDE 10 MG/ML IJ SOLN
20.0000 mg | Freq: Once | INTRAMUSCULAR | Status: AC
  Administered 2017-10-28: 20 mg via INTRAVENOUS
  Filled 2017-10-28: qty 2

## 2017-10-28 MED ORDER — HEPARIN (PORCINE) IN NACL 100-0.45 UNIT/ML-% IJ SOLN
1100.0000 [IU]/h | INTRAMUSCULAR | Status: DC
  Administered 2017-10-28: 1100 [IU]/h via INTRAVENOUS
  Filled 2017-10-28 (×2): qty 250

## 2017-10-28 MED ORDER — HEPARIN (PORCINE) IN NACL 100-0.45 UNIT/ML-% IJ SOLN
1000.0000 [IU]/h | INTRAMUSCULAR | Status: DC
  Administered 2017-10-28: 1000 [IU]/h via INTRAVENOUS

## 2017-10-28 NOTE — Progress Notes (Addendum)
ANTICOAGULATION CONSULT NOTE - [facility] for Heparin drip Indication: atrial fibrillation  Allergies  Allergen Reactions  . Ace Inhibitors Cough  . Codeine Other (See Comments)    GI Upset  . Indomethacin Other (See Comments)    Elevated BP  . Losartan Swelling    Angioedema    Patient Measurements: Height: 5\' 8"  (172.7 cm) Weight: 159 lb 9.8 oz (72.4 kg) IBW/kg (Calculated) : 68.4 Heparin Dosing Weight: actual body weight  Vital Signs: Temp: 97.4 F (36.3 C) (10/14 2000) Temp Source: Oral (10/14 2000) BP: 153/62 (10/14 2100) Pulse Rate: 108 (10/14 2100)  Labs: Recent Labs    10/26/17 0326 10/27/17 0304 10/28/17 0640 10/28/17 1104 10/28/17 2027  HGB 9.9* 10.0* 9.9*  --   --   HCT 32.6* 33.7* 32.8*  --   --   PLT 203 243 224  --   --   APTT  --   --   --  39* 110*  HEPARINUNFRC  --   --   --  >2.20*  --   CREATININE 1.26* 1.12 0.94  --   --     Estimated Creatinine Clearance: 52.6 mL/min (by C-G formula based on SCr of 0.94 mg/dL).   Medications:  Apixaban 5 mg BID PTA-last dose inpatient 10/27/17 at 2229  Assessment: 84 y/o M with a h/o chronic atrial fibrillation on apixaban. Pharmacy asked to transition patient to heparin drip in anticipation of possible surgery for colovesical fistula.    Baseline heparin level > 2.2, falsely elevated as expected due to recent Apixaban use  Baseline aPTT = 39 seconds (prior to starting heparin infusion)  2027 heparin level = 110 seconds, elevated on heparin infusion at 1100 units/hr  CBC: Hgb 9.9, Pltc WNL  No bleeding or infusion issues per nursing  Lab confirms aPTT collected from opposite arm from where heparin infusing  Goal of Therapy:  aPTT 66-102s Heparin level 0.3-0.7 units/ml Monitor platelets by anticoagulation protocol: Yes   Plan:   Decrease heparin infusion to 1000 units/hr  aPTT and heparin level 8 hours after rate change; adjusting based on aPTT for now until aPTT  and heparin level correlate and thus effects of Apixaban have diminished.   Daily CBC and heparin level  Monitor closely for s/sx of bleeding  F/u surgery plans   Lindell Spar, PharmD, BCPS Pager: [phone] 10/28/2017 10:15 PM

## 2017-10-28 NOTE — Progress Notes (Signed)
Physical Therapy Treatment Patient Details Name: Benjamin Patel MRN: [id number] DOB: March 05, 1927 Today's Date: 10/28/2017    History of Present Illness 84 YO male admitted 10/24/14 with urosepsis, colorvesicle fistula, for planned colostomy, aND left ureteral  CALCULI, STABLE. H/O PVCs,, DM, COPD, Lung cancer.    PT Comments    The patient reports feeling very tired. Patient ambulated x 25'. Rectal pouch leaked after sitting down. Patient ambulated on 2 liters with saturation > 92%. Hr irregular-96-127. Continue PT.   Follow Up Recommendations  SNF     Equipment Recommendations  None recommended by PT    Recommendations for Other Services OT consult     Precautions / Restrictions Precautions Precautions: Fall Precaution Comments: monitor sats on O2, desatted on RA, frequent loose BM Restrictions Weight Bearing Restrictions: No    Mobility  Bed Mobility Overal bed mobility: Needs Assistance Bed Mobility: Sit to Supine       Sit to supine: Mod assist;+2 for physical assistance;+2 for safety/equipment   General bed mobility comments: assist trunk and legs. Patient had rectal pouch that leaked after sitting down. RN in to clean patient up.  Transfers Overall transfer level: Needs assistance Equipment used: Rolling walker (2 wheeled) Transfers: Sit to/from Stand Sit to Stand: Mod assist;+2 safety/equipment         General transfer comment: power assist to stand from recliner, cues for hand placement  Ambulation/Gait Ambulation/Gait assistance: Mod assist;+2 safety/equipment Gait Distance (Feet): 25 Feet Assistive device: Rolling walker (2 wheeled) Gait Pattern/deviations: Step-to pattern;Step-through pattern     General Gait Details: patient reports feeling worn out. Limited ambulation today.  HR 96-127-per RN, normal fluctuations for HR   Stairs             Wheelchair Mobility    Modified Rankin (Stroke Patients Only)       Balance                                             Cognition Arousal/Alertness: Awake/alert Behavior During Therapy: WFL for tasks assessed/performed Overall Cognitive Status: Impaired/Different from baseline Area of Impairment: Orientation                                      Exercises      General Comments        Pertinent Vitals/Pain Pain Assessment: No/denies pain    Home Living                      Prior Function            PT Goals (current goals can now be found in the care plan section) Progress towards PT goals: Progressing toward goals    Frequency    Min 2X/week      PT Plan Current plan remains appropriate;Frequency needs to be updated    Co-evaluation              AM-PAC PT "6 Clicks" Daily Activity  Outcome Measure  Difficulty turning over in bed (including adjusting bedclothes, sheets and blankets)?: Unable Difficulty moving from lying on back to sitting on the side of the bed? : Unable Difficulty sitting down on and standing up from a chair with arms (e.g., wheelchair, bedside commode, etc,.)?: A Lot Help needed moving  to and from a bed to chair (including a wheelchair)?: A Lot Help needed walking in hospital room?: Total Help needed climbing 3-5 steps with a railing? : Total 6 Click Score: 8    End of Session Equipment Utilized During Treatment: Oxygen Activity Tolerance: Patient limited by fatigue Patient left: in bed;with call bell/phone within reach;with nursing/sitter in room;with family/visitor present Nurse Communication: Mobility status PT Visit Diagnosis: Unsteadiness on feet (R26.81)     Time: 0936-1000 PT Time Calculation (min) (ACUTE ONLY): 24 min  Charges:  $Gait Training: 23-37 mins                     [place] Pager [phone] Office [phone]    Claretha Cooper 10/28/2017, 10:36 AM

## 2017-10-28 NOTE — Progress Notes (Signed)
Patient ID: Benjamin Patel, male   DOB: 17-Feb-1927, 84 y.o.   MRN: [id number]       Subjective: Patient sitting up eating his breakfast.  No complaints.    Objective: Vital signs in last 24 hours: Temp:  [97.2 F (36.2 C)-97.6 F (36.4 C)] 97.6 F (36.4 C) (10/14 0322) Pulse Rate:  [86-113] 113 (10/14 0800) Resp:  [14-23] 23 (10/14 0800) BP: (124-149)/(48-79) 147/74 (10/14 0800) SpO2:  [91 %-99 %] 96 % (10/14 0800) Weight:  [72.4 kg] 72.4 kg (10/14 0442) Last BM Date: 10/27/17  Intake/Output from previous day: 10/13 0701 - 10/14 0700 In: 630.4 [P.O.:530; I.V.:0.3; IV Piggyback:100.1] Out: 1800 [Urine:1800] Intake/Output this shift: No intake/output data recorded.  PE: Heart: irregular Lungs: CTAB Abd: soft, NT, ND, +BS GU: foley in place with feculent appearing output. As well as flexiseal? With feculent output as well.  Lab Results:  Recent Labs    10/26/17 0326 10/27/17 0304  WBC 8.8 8.4  HGB 9.9* 10.0*  HCT 32.6* 33.7*  PLT 203 243   BMET Recent Labs    10/27/17 0304 10/28/17 0640  NA 144 147*  K 3.9 3.7  CL 106 106  CO2 28 30  GLUCOSE 146* 135*  BUN 32* 28*  CREATININE 1.12 0.94  CALCIUM 9.3 9.4   PT/INR No results for input(s): LABPROT, INR in the last 72 hours. CMP     Component Value Date/Time   NA 147 (H) 10/28/2017 0640   NA 139 08/05/2017 1018   K 3.7 10/28/2017 0640   CL 106 10/28/2017 0640   CO2 30 10/28/2017 0640   GLUCOSE 135 (H) 10/28/2017 0640   BUN 28 (H) 10/28/2017 0640   BUN 23 08/05/2017 1018   CREATININE 0.94 10/28/2017 0640   CREATININE 1.26 (H) 08/17/2014 0928   CALCIUM 9.4 10/28/2017 0640   PROT 5.1 (L) 10/23/2017 1121   ALBUMIN 2.6 (L) 10/23/2017 1121   AST 26 10/23/2017 1121   ALT 15 10/23/2017 1121   ALKPHOS 53 10/23/2017 1121   BILITOT 0.9 10/23/2017 1121   GFRNONAA >60 10/28/2017 0640   GFRNONAA 52 (L) 08/17/2014 0928   GFRAA >60 10/28/2017 0640   GFRAA 60 08/17/2014 0928   Lipase  No results found for:  LIPASE     Studies/Results: Dg Chest 1 View  Result Date: 10/26/2017 CLINICAL DATA:  Cough and shortness of breath EXAM: CHEST  1 VIEW COMPARISON:  10/23/2017 FINDINGS: Cardiac shadow is stable. Aortic calcifications are again seen. New bilateral pleural effusions are noted with likely underlying atelectasis. No bony abnormality is seen. IMPRESSION: New bilateral pleural effusions. Electronically Signed   By: Inez Catalina M.D.   On: 10/26/2017 11:23    Anti-infectives: Anti-infectives (From admission, onward)   Start     Dose/Rate Route Frequency Ordered Stop   10/25/17 2200  cefTRIAXone (ROCEPHIN) 2 g in sodium chloride 0.9 % 100 mL IVPB     2 g 200 mL/hr over 30 Minutes Intravenous Every 24 hours 10/25/17 1008     10/24/17 1000  vancomycin (VANCOCIN) IVPB 1000 mg/200 mL premix  Status:  Discontinued     1,000 mg 200 mL/hr over 60 Minutes Intravenous Every 36 hours 10/23/17 1609 10/24/17 1023   10/23/17 2000  meropenem (MERREM) 1 g in sodium chloride 0.9 % 100 mL IVPB  Status:  Discontinued     1 g 200 mL/hr over 30 Minutes Intravenous Every 12 hours 10/23/17 1633 10/25/17 1000   10/23/17 1145  vancomycin (VANCOCIN) IVPB  1000 mg/200 mL premix     1,000 mg 200 mL/hr over 60 Minutes Intravenous  Once 10/23/17 1140 10/23/17 1507   10/23/17 1130  piperacillin-tazobactam (ZOSYN) IVPB 3.375 g     3.375 g 100 mL/hr over 30 Minutes Intravenous  Once 10/23/17 1120 10/23/17 1200       Assessment/Plan Colovesical fistula c/b urosepsis- resolving HTN COPD PAD CKD III Carotid stenosis HLD Afib Anticoagulation (eliquis)  -Continue supportive care, antibiotic therapy -Needs cardiology clearance, anticoagulation held for surgery if pursued -Please change anticoagulation to heparin drip in anticipation of surgery this week, hold eliquis -Plan fecal diversion this week if patient agreeable.  He is very hesitant to think he wants any type of operation.  We discussed a lap diverting  ostomy this morning and he is still hesitant.  He ultimately stated, "let's just wait and see what cardiology says." -Recommend addition of fiber supplement- fibercon added  FEN - CMD VTE - eliquis, would like to convert to heparin in anticipation of OR ID - Rocephin   LOS: 5 days    Henreitta Cea , [facility] 10/28/2017, 8:29 AM Pager: [phone]

## 2017-10-28 NOTE — Progress Notes (Signed)
Patient ID: Benjamin Patel, male   DOB: Sep 26, 1927, 84 y.o.   MRN: [id number]    Assessment:  1) Colovesical fistula: Continue empiric antibiotic therapy for polymicrobial infection pending colostomy.  Can probably stop antibiotics after 10-14 days if clinically continues to improve.  2) Large left ureteral calculus: The calcification in the area of the left ureter has been present for at least 7 years.  A PET CT scan in 10/16 and 4/14 revealed the same calcification without evidence of hydronephrosis and a CT scan that was performed in 9/12 also revealed the same calcification without hydronephrosis at that time.  While this very likely could be a stone in his distal ureter is possible it may be adjacent to the ureter as he has not developed any hydronephrosis with the calcification unchanged over a period of 7 years (The radiologist to read his CT scan in 9/12 did not feel the stone was in his ureter). I have discussed this with the patient and his wife today.  I told him that it was my recommendation because he is completely asymptomatic, has normal renal function, is improving clinically and has had this stone for 7 years now that there was no need for intervention with a stent, ureteroscopy or lithotripsy.  They are in agreement with me.   Plan:  1.  Further urologic intervention is not necessary. 2.  I will have him follow-up with me as an outpatient after discharge. 3.  Will sign off.  Please contact me with any other urologic issues that should arise.  Subjective: Patient is without complaint this morning.  Objective: Vital signs in last 24 hours: Temp:  [97.2 F (36.2 C)-97.6 F (36.4 C)] 97.6 F (36.4 C) (10/14 0322) Pulse Rate:  [86-113] 113 (10/14 0800) Resp:  [14-23] 23 (10/14 0800) BP: (124-149)/(48-79) 147/74 (10/14 0800) SpO2:  [91 %-99 %] 96 % (10/14 0800) Weight:  [72.4 kg] 72.4 kg (10/14 0442)A  Intake/Output from previous day: 10/13 0701 - 10/14 0700 In: 630.4  [P.O.:530; I.V.:0.3; IV Piggyback:100.1] Out: 1800 [Urine:1800] Intake/Output this shift: No intake/output data recorded.  Past Medical History:  Diagnosis Date  . Abnormal prostate exam   . Arthritis    Gout   . Asthma    as a child  . Carotid stenosis   . Carotid stenosis right side   Dr.Early does carotid ultrasounds yrly-last one Mar 14-report in epic  . Cholelithiasis   . COPD (chronic obstructive pulmonary disease) (HCC)    slight   . Enlarged prostate    slightly   . Essential hypertension, benign    takes Clonodine,Losartan,and Metoprolol and HCTZ  . Gout    no meds required  . History of bronchitis   . History of colon polyps   . History of shingles   . Lung cancer ([place]) dx'd 2014   LLL resection; xrt  . Mixed hyperlipidemia    takes Pravastatin daily  . PAC (premature atrial contraction)   . Pneumonia 02/08/2012  . PVCs (premature ventricular contractions)   . Radiation 12/13/14-12/24/14   left upper lobe nodule 50 gray  . Renal artery stenosis (HCC)    right  . Renal insufficiency   . Skin cancer    Lung Ca- Left Lung  . Type II or unspecified type diabetes mellitus without mention of complication, not stated as uncontrolled    02/14/2016 diet controlled    Physical Exam:  General: Awake, alert and in no apparent distress Lungs: Normal respiratory effort, chest expands  symmetrically.  Abdomen: No CVAT  Lab Results: Recent Labs    10/26/17 0326 10/27/17 0304  WBC 8.8 8.4  HGB 9.9* 10.0*  HCT 32.6* 33.7*   BMET Recent Labs    10/27/17 0304 10/28/17 0640  NA 144 147*  K 3.9 3.7  CL 106 106  CO2 28 30  GLUCOSE 146* 135*  BUN 32* 28*  CREATININE 1.12 0.94  CALCIUM 9.3 9.4   No results for input(s): LABURIN in the last 72 hours. Results for orders placed or performed during the hospital encounter of 10/23/17  Culture, blood (Routine x 2)     Status: None (Preliminary result)   Collection Time: 10/23/17 11:05 AM  Result Value Ref Range  Status   Specimen Description   Final    BLOOD LEFT ANTECUBITAL Performed at [facility] [address] [place], [place] [address]    Special Requests   Final    BOTTLES DRAWN AEROBIC AND ANAEROBIC Blood Culture adequate volume Performed at [facility] [address] [place], [place] [address]    Culture   Final    NO GROWTH 4 DAYS Performed at [facility], [place] [address] [place], [place] [address]    Report Status PENDING  Incomplete  Culture, blood (Routine x 2)     Status: None (Preliminary result)   Collection Time: 10/23/17 11:13 AM  Result Value Ref Range Status   Specimen Description   Final    BLOOD RIGHT ANTECUBITAL Performed at [facility] [address] [place], [place] [address]    Special Requests   Final    BOTTLES DRAWN AEROBIC AND ANAEROBIC Blood Culture adequate volume Performed at [facility] [address] [place], [place] [address]    Culture   Final    NO GROWTH 4 DAYS Performed at [facility], [place] [address] [place], [place] [address]    Report Status PENDING  Incomplete  Urine culture     Status: Abnormal   Collection Time: 10/23/17 11:21 AM  Result Value Ref Range Status   Specimen Description   Final    URINE, CATHETERIZED Performed at [facility] [address] [place], [place] [address]    Special Requests   Final    NONE Performed at [facility], [place] [address] [place], [place] [address]    Culture MULTIPLE SPECIES PRESENT, SUGGEST RECOLLECTION (A)  Final   Report Status 10/25/2017 FINAL  Final  MRSA PCR Screening     Status: None   Collection Time: 10/23/17  3:59 PM  Result Value Ref Range Status   MRSA by PCR NEGATIVE NEGATIVE Final    Comment:        The GeneXpert MRSA Assay (FDA approved for NASAL specimens only), is one component of a comprehensive MRSA colonization surveillance program.  It is not intended to diagnose MRSA infection nor to guide or monitor treatment for MRSA infections. Performed at [facility], [place] [address] [place],  [address]     Studies/Results: No results found.    Nai Dasch C 10/28/2017, 8:41 AM

## 2017-10-28 NOTE — Consult Note (Addendum)
Cardiology Consultation:   Patient ID: Benjamin Patel MRN: [id number]; DOB: May 02, 1927  Admit date: 10/23/2017 Date of Consult: 10/28/2017  Primary Care Provider: Deland Pretty, MD Primary Cardiologist: Candee Furbish, MD    Patient Profile:   Benjamin Patel is a 84 y.o. 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 He is being seen today for preoperative cardiovascular examination for surgical clearance, at the request of Dr. Zigmund Daniel, Internal Medicine, and Dr. Kae Heller, general surgery.   History of Present Illness:    Benjamin Patel is a 84 y.o. 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 He is being seen today for preoperative cardiovascular examination for surgical clearance, at the request of Dr. Zigmund Daniel, Internal Medicine, and Dr. Kae Heller, general surgery.   Benjamin Patel is followed by Dr. Marlou Porch. Primary cardiac issue has been atrial fibrillation. Noted to be chronic afib. He is on rate control therapy with Cardizem and metoprolol. He is on Eliquis 5 mg BID for stroke prophylaxis. He was also found to have evidence of coronary artery  calcifications on chest CT. Per office notes, he apparently had a nuclear stress test, at one point, that was low risk however I do not see any other documentation of this in his record. Echocardiogram, during this admission, shows normal LVEF at 50-55%, normal wall motion, aortic valve sclerosis w/ trivial AI, moderate MR and moderate TR. Dr. Donnetta Hutching follows him for carotid artery disease. Last carotid dopplers 05/2017 showed bilateral 1-39% ICA stenosis. Pt was recently seen by Dr. Marlou Porch in clinic 10/16/17. Pt noted to be fairly stable from a cardiac standpoint, except for elevated resting HR. His Cardizem was increased to 180 mg daily. Otherwise, no cardiac complaints. Pt advised to f/u in 6 months.   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 He has been treated w/ antibiotics. WBC WNL. Afebrile. BP stable.   From a cardiac standpoint, he is in atrial fibrillation with ventricular rates in the low 100s-110s but completely asymptomatic. Systolic pressures in the upper 140s. He denies palpitations, dizziness, syncope and near syncope. He also denies any anginal symptoms. Prior to admit, he was living independently. He lives with his wife. Ambulates with a cane. Able to perform ADLs w/o any assistance and w/o any exertional CP or dyspnea.     Past Medical History:  Diagnosis Date  . Abnormal prostate exam   . Arthritis    Gout   . Asthma    as a child  . Carotid stenosis   . Carotid stenosis right side   Dr.Early does carotid ultrasounds yrly-last one Mar 14-report in epic  . Cholelithiasis   . COPD (chronic obstructive pulmonary disease) (HCC)    slight   . Enlarged  prostate    slightly   . Essential hypertension, benign    takes Clonodine,Losartan,and Metoprolol and HCTZ  . Gout    no meds required  . History of bronchitis   . History of colon polyps   . History of shingles   . Lung  cancer ([place]) dx'd 2014   LLL resection; xrt  . Mixed hyperlipidemia    takes Pravastatin daily  . PAC (premature atrial contraction)   . Pneumonia 02/08/2012  . PVCs (premature ventricular contractions)   . Radiation 12/13/14-12/24/14   left upper lobe nodule 50 gray  . Renal artery stenosis (HCC)    right  . Renal insufficiency   . Skin cancer    Lung Ca- Left Lung  . Type II or unspecified type diabetes mellitus without mention of complication, not stated as uncontrolled    02/14/2016 diet controlled    Past Surgical History:  Procedure Laterality Date  . COLONOSCOPY    . NO PAST SURGERIES    . SEGMENTECOMY Left 06/16/2012   Procedure: left lower lobe superior SEGMENTECTOMY with node dissection;  Surgeon: [name], MD;  Location: [facility];  Service: Thoracic;  Laterality: Left;  left superior  . VIDEO ASSISTED THORACOSCOPY Left 06/16/2012   Procedure: VIDEO ASSISTED THORACOSCOPY;  Surgeon: [name], MD;  Location: [facility];  Service: Thoracic;  Laterality: Left;     Home Medications:  Prior to Admission medications   Medication Sig Start Date End Date Taking? Authorizing Provider  allopurinol (ZYLOPRIM) 100 MG tablet Take 100 mg by mouth 2 (two) times daily.  05/16/16  Yes [provider]  apixaban (ELIQUIS) 5 MG TABS tablet Take 1 tablet (5 mg total) by mouth 2 (two) times daily. 06/18/17  Yes Jerline Pain, MD  cholecalciferol (VITAMIN D) 1000 units tablet Take 1,000 Units by mouth 2 (two) times daily.    Yes [provider]  diltiazem (CARDIZEM CD) 180 MG 24 hr capsule Take 1 capsule (180 mg total) by mouth daily. 09/09/17 12/08/17 Yes Jerline Pain, MD  doxycycline (VIBRA-TABS) 100 MG tablet Take 100 mg by mouth every 12 (twelve) hours. 10/17/17  Yes [provider]  gabapentin (NEURONTIN) 400 MG capsule Take 400 mg by mouth daily.   Yes [provider]  metoprolol succinate (TOPROL-XL) 100 MG 24 hr tablet Take 1 tablet (100 mg  total) by mouth daily. Take with or immediately following a meal. 08/17/14  Yes Daub, Loura Back, MD  mirabegron ER (MYRBETRIQ) 25 MG TB24 tablet Take 25 mg by mouth daily.   Yes [provider]  Probiotic Product (PROBIOTIC PO) Take 1 tablet by mouth daily.   Yes [provider]  tamsulosin (FLOMAX) 0.4 MG CAPS capsule Take 0.4 mg by mouth daily. 08/10/17  Yes [provider]    Inpatient Medications: Scheduled Meds: . apixaban  5 mg Oral BID  . chlorhexidine  15 mL Mouth Rinse BID  . diltiazem  60 mg Oral Q8H  . mouth rinse  15 mL Mouth Rinse q12n4p  . metoprolol tartrate  50 mg Oral BID  . polycarbophil  625 mg Oral Daily   Continuous Infusions: . sodium chloride Stopped (10/27/17 2238)  . cefTRIAXone (ROCEPHIN)  IV Stopped (10/27/17 2308)   PRN Meds: sodium chloride, acetaminophen, iopamidol, morphine injection, traZODone  Allergies:    Allergies  Allergen Reactions  . Ace Inhibitors Cough  . Codeine Other (See Comments)    GI Upset  . Indomethacin Other (See Comments)  Elevated BP  . Losartan Swelling    Angioedema    Social History:   Social History   Socioeconomic History  . Marital status: Married    Spouse name: [name]  . Number of children: 3  . Years of education: 27  . Highest education level: Not on file  Occupational History  . Occupation: retired    Comment: Herbalist  Social Needs  . Financial resource strain: Not on file  . Food insecurity:    Worry: Not on file    Inability: Not on file  . Transportation needs:    Medical: Not on file    Non-medical: Not on file  Tobacco Use  . Smoking status: Former Smoker    Packs/day: 1.50    Years: 30.00    Pack years: 45.00    Types: Cigarettes    Last attempt to quit: 01/16/1976    Years since quitting: 41.8  . Smokeless tobacco: Never Used  Substance and Sexual Activity  . Alcohol use: No    Alcohol/week: 0.0 standard drinks  . Drug use: No  . Sexual activity: Not  on file  Lifestyle  . Physical activity:    Days per week: Not on file    Minutes per session: Not on file  . Stress: Not on file  Relationships  . Social connections:    Talks on phone: Not on file    Gets together: Not on file    Attends religious service: Not on file    Active member of club or organization: Not on file    Attends meetings of clubs or organizations: Not on file    Relationship status: Not on file  . Intimate partner violence:    Fear of current or ex partner: Not on file    Emotionally abused: Not on file    Physically abused: Not on file    Forced sexual activity: Not on file  Other Topics Concern  . Not on file  Social History Narrative   Lives at home with wife   Caffeine- coffee, 1 cup daily    Family History:   Family History  Problem Relation Age of Onset  . Heart disease Mother   . Diabetes Mother        AKA  Right   . Hypertension Mother   . Varicose Veins Mother   . Kidney disease Mother        One Kidney  . Cancer Father        Lung     ROS:  Please see the history of present illness.   All other ROS reviewed and negative.     Physical Exam/Data:   Vitals:   10/28/17 0200 10/28/17 0322 10/28/17 0442 10/28/17 0600  BP: (!) 141/60   (!) 134/48  Pulse: 99   99  Resp: 17   14  Temp:  97.6 F (36.4 C)    TempSrc:      SpO2: 99%   98%  Weight:   72.4 kg   Height:        Intake/Output Summary (Last 24 hours) at 10/28/2017 0753 Last data filed at 10/28/2017 0600 Gross per 24 hour  Intake 630.41 ml  Output 1800 ml  Net -1169.59 ml   Filed Weights   10/26/17 0417 10/27/17 0512 10/28/17 0442  Weight: 74.5 kg 72.4 kg 72.4 kg   Body mass index is 24.27 kg/m.  General:  Elderly WM Well nourished, well developed, in no acute distress HEENT:  normal Lymph: no adenopathy Neck: no JVD Endocrine:  No thryomegaly Vascular: + carotid bruit. FA pulses 2+ bilaterally without bruits  Cardiac:  irregularly irregular rhythm, tachy  rate Lungs:  clear to auscultation bilaterally, no wheezing, rhonchi or rales  Abd: soft, nontender, no hepatomegaly  Ext: no edema Musculoskeletal:  No deformities, BUE and BLE strength normal and equal Skin: warm and dry  Neuro:  CNs 2-12 intact, no focal abnormalities noted Psych:  Normal affect   EKG:  The EKG was personally reviewed and demonstrates:  Chronic afib 88 bpm  Telemetry:  Telemetry was personally reviewed and demonstrates:  afib in the 110s  Relevant CV Studies: 2D Echo 10/26/17 Study Conclusions  - Left ventricle: The cavity size was normal. Wall thickness was   normal. Systolic function was normal. The estimated ejection   fraction was in the range of 50% to 55%. Mid-ventricle false   tendon. Wall motion was normal; there were no regional wall   motion abnormalities. The study is not technically sufficient to   allow evaluation of LV diastolic function. - Aortic valve: Trileaflet. Sclerosis without stenosis. There was   trivial regurgitation. - Mitral valve: Sclerotic leaflets. Moderate regurgitation. - Left atrium: Moderately dilated. - Right ventricle: The cavity size was moderately dilated. Normal   systolic function. - Right atrium: Severely dilated. - Atrial septum: There was increased thickness of the septum,   consistent with lipomatous hypertrophy. - Tricuspid valve: There was moderate regurgitation. - Pulmonary arteries: PA peak pressure: 56 mm Hg (S). - Inferior vena cava: The vessel was dilated. The respirophasic   diameter changes were blunted (< 50%), consistent with elevated   central venous pressure. - Pericardium, extracardiac: A trivial pericardial effusion was   identified posterior to the heart. There was a left pleural   effusion.  Impressions:  - LVEF 50-55%, normal wall thickness, normal wall motion, aortic   valve sclerosis with trivial AI, moderate MR, moderate LAE,   severe RAE, moderate TR, RVSP 56 mmHg, dilated IVC,  trivial   pericardial effusion, left pleural effusion.  Laboratory Data:  Chemistry Recent Labs  Lab 10/26/17 0326 10/27/17 0304 10/28/17 0640  NA 140 144 147*  K 3.7 3.9 3.7  CL 102 106 106  CO2 29 28 30   GLUCOSE 155* 146* 135*  BUN 34* 32* 28*  CREATININE 1.26* 1.12 0.94  CALCIUM 9.2 9.3 9.4  GFRNONAA 49* 57* >60  GFRAA 57* >60 >60  ANIONGAP 9 10 11     Recent Labs  Lab 10/23/17 1121  PROT 5.1*  ALBUMIN 2.6*  AST 26  ALT 15  ALKPHOS 53  BILITOT 0.9   Hematology Recent Labs  Lab 10/25/17 0318 10/26/17 0326 10/27/17 0304  WBC 10.8* 8.8 8.4  RBC 3.46* 3.53* 3.55*  HGB 9.7* 9.9* 10.0*  HCT 31.9* 32.6* 33.7*  MCV 92.2 92.4 94.9  MCH 28.0 28.0 28.2  MCHC 30.4 30.4 29.7*  RDW 15.4 15.5 15.5  PLT 177 203 243   Cardiac EnzymesNo results for input(s): TROPONINI in the last 168 hours.  Recent Labs  Lab 10/23/17 1124  TROPIPOC 0.00    BNPNo results for input(s): BNP, PROBNP in the last 168 hours.  DDimer No results for input(s): DDIMER in the last 168 hours.  Radiology/Studies:  Ct Abdomen Pelvis Wo Contrast  Result Date: 10/25/2017 CLINICAL DATA:  Patient has feces in his Foley catheter. Concern for colovesicular fistula. Rectal contrast normal contrast administered. EXAM: CT ABDOMEN AND PELVIS WITHOUT CONTRAST TECHNIQUE: Multidetector CT  imaging of the abdomen and pelvis was performed following the standard protocol without IV contrast. COMPARISON:  PET-CT 11/10/2014 FINDINGS: Lower chest: Bilateral pleural effusions moderate in volume. Hepatobiliary: No focal hepatic lesion on noncontrast exam. Gallstones layer within the gallbladder. No gallbladder distension. Pancreas: Pancreas is normal. No ductal dilatation. No pancreatic inflammation. Spleen: Normal spleen Adrenals/urinary tract: Kidneys are normal. Nonobstructing calculus in lower pole LEFT kidney. Ureters grossly normal and difficult to follow. There is large calculus within the distal LEFT ureter  measuring 12 mm (image 60/2). This calculus is approximately 4 cm from the vascular junction identified on PET-CT scan 11/10/2014. Foley catheter within lumen of the bladder. There is moderate volume of gas within the lumen of bladder. Additionally, there is high-density contrast within the bladder which presumably originates from the bowel. The sigmoid colon approximates the dome of the bladder. There is no clear plane between the bladder and sigmoid colon. Several potential communicating surfaces between the sigmoid colon and bladder. There are multiple diverticular through this region. The small bowel also approximates the dome of the bladder. Enteric contrast via oral route progress into the mid small bowel. Stomach/Bowel: Stomach, small-bowel cecum normal. Appendix normal. Contrast progresses to the mid small bowel. The ascending and transverse colon normal. Multiple diverticula of the sigmoid colon. Again the colon approximates the bladder wall. Suspicion for colovesicular fistula. Vascular/Lymphatic: Abdominal aorta is normal caliber with atherosclerotic calcification. There is no retroperitoneal or periportal lymphadenopathy. No pelvic lymphadenopathy. Reproductive: Prostate grossly normal. Small focus of gas within the LEFT aspect of the prostate gland. Other: No free fluid. Musculoskeletal: No aggressive osseous lesion. IMPRESSION: 1. Gas and enteric contrast within the bladder lumen consistent with enterovesicular fistula. Favor colovesicular fistula with the sigmoid colon approximating the bladder wall. Multiple diverticula present through this region of sigmoid colon. The small bowel also approximates the dome of the bladder but less favored to be a source of fistula. 2. Chronic large distal LEFT ureteral calculus.  No obstruction. 3. Moderate bilateral pleural effusions. These results will be called to the ordering clinician or representative by the Radiologist Assistant, and communication documented  in the PACS or zVision Dashboard. Electronically Signed   By: Suzy Bouchard M.D.   On: 10/25/2017 16:41   Dg Chest 1 View  Result Date: 10/26/2017 CLINICAL DATA:  Cough and shortness of breath EXAM: CHEST  1 VIEW COMPARISON:  10/23/2017 FINDINGS: Cardiac shadow is stable. Aortic calcifications are again seen. New bilateral pleural effusions are noted with likely underlying atelectasis. No bony abnormality is seen. IMPRESSION: New bilateral pleural effusions. Electronically Signed   By: Inez Catalina M.D.   On: 10/26/2017 11:23    Assessment and Plan:   Benjamin Patel is a 84 y.o. 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 He is being seen today for preoperative cardiovascular examination for surgical clearance, at the request of Dr. Zigmund Daniel, Internal Medicine, and Dr. Kae Heller, general surgery.   1. Preoperative Cardiovascular Assessment: cardiac issues are fairly stable, with the exception of mildly elevated resting HR. He is in chronic afib but asymptomatic. Volume appears stable. Echo done this admission showed normal LVEF and normal wall  motion with moderate MR and moderate TR. He has known coronary calcifications noted no prior chest CT but denies any anginal symptomatolgy. Prior to admit, he was living independently and performing ADLs w/o any exertional CP or dyspnea. Also with known carotid artery disease, however recent dopplers 5/19 showed 1-39% bilateral ICA stenosis. Other than irregular heart rhythm and carotid bruit, his exam is fairly benign. Based on  assessment, there is no need for any additional cardiac testing prior to surgery. Recommend continuation of cardiac meds during the perioperative period. Eliquis however will need to be held for surgery. Resume once safe to do so from a surgical standpoint. He will also need close monitoring of HR and volume status during the post operative period. We will follow along with you.    2. Chronic Atrial Fibrillation: current rates in the 100-110s, however RN notes his rates jump up into the 120s. SBPs in the 140s. He is asymptomatic. Will continue PO Cardizem 60 mg Q8H. Will titrate metoprolol to 75 mg BID. Continue to monitor rates. He remains on Eliquis currently for a/c, but this will need to be held prior to surgery. Resume once safe to do so from a surgical standpoint.   3. Coronary Calcifications: denies angina. Continue medical therapy.   4. Urosepsis: remains on antibiotics. Management per IM.   5. Colovesical Fistula: management per general surgery.   MD to follow with further recommendations.   For questions or updates, please contact [facility] [place] Please consult [email or web address] for contact info under     Signed, Lyda Jester, PA-C  10/28/2017 7:53 AM As above, patient seen and examined.  Briefly he is an 84 year old male with past medical history of permanent atrial fibrillation, coronary calcification by CT scan, moderate mitral regurgitation, moderate tricuspid regurgitation, history of non-small cell lung cancer treated with radiation and resection, hypertension, diabetes mellitus, hyperlipidemia, COPD admitted with urosepsis for preoperative evaluation prior to colovesical fistula repair.  Patient apparently had a negative nuclear study 7 years ago for coronary calcification.  Echocardiogram this admission showed ejection fraction 50 to 55%, moderate mitral regurgitation, right side enlargement, moderate tricuspid regurgitation.  Patient has limited mobility.  He can climb 1 flight  of stairs and walk on a flat incline.  He does have some dyspnea on exertion but denies chest pain.  He does have lower extremity edema that has improved since admission. Electrocardiogram shows atrial fibrillation with elevated rate, right axis deviation and nonspecific ST changes.  Chest x-ray shows bilateral effusions.  1 preoperative evaluation prior to repair of colovesical fistula-patient has some dyspnea on exertion but denies chest pain.  Previous nuclear study not available but apparently showed no ischemia.  Given age and other medical issues he will be at increased risk for surgery.  However I would not recommend pursuing further ischemia evaluation preoperatively.  He understands there is some risk with this operation.  He is hesitant to proceed but will discuss with surgery.  Would hold apixaban prior to surgery if he agrees to proceed and resume postoperatively when hemostasis achieved.  2 permanent atrial fibrillation-continue Cardizem and metoprolol for rate control.  Follow on telemetry.  As outlined above we will hold apixaban prior to surgery if patient agrees to proceed and resume following when hemostasis achieved.  Note he would need to be off of apixaban 2 days prior to surgery.  3 history of coronary calcification-would consider statin following discharge.  4 urosepsis and colovesical fistula-Per general surgery and primary service.  Kirk Ruths, MD

## 2017-10-28 NOTE — Progress Notes (Signed)
PROGRESS NOTE    Benjamin Patel  VVO:[id number] DOB: 1927-05-04 DOA: 10/23/2017 PCP: Deland Pretty, MD   Brief Narrative: :84 yo male admitted 10/9 with septic shock urosepsistreated with several antibiotics and pressors and culture showed multiple organisms and fecaluria no work-up is going on for colovesical fistula. He was initially treated with meropenem no change Rocephin as no resistant organisms had been identified. He is followed by urology. Urology thinks he probably has a colovesical fistula. He has been evaluated by general surgery who feels a fecal diversion is likely the best course of action.  [facility] assumed care 10/25/2017 Chest x-ray 10/25/2017 shows new bilateral pleural effusion   Assessment & Plan:   Active Problems:   Septic shock (HCC)   Hypotension   Fever   Urinary tract infection without hematuria   SOB (shortness of breath)  1]Colovesical fistula with urosepsis and fecaluria on ceftriaxoneleuko cytosis improved WBC back to normal.    2]status post sepsis with septic shock currently off pressors continue ceftriaxone  3]diarrhea resolved  4]atrial fibrillation on Cardizem and metoprolol and Eliquis.  Seen by cardiology.  5]AKI on CKD Baseline creatinine 1.4-1.5. It peaked at 1.99 improving with IV hydration. Creatinine today 1.12  6]diet-controlled type 2 diabetes stable  7]anemia of chronic disease stable  8]new bilateral pleural effusion by chest x-ray will give a one-time dose of Lasix 20 mg. I do not see an echocardiogram in record will order an echo.    DVT prophylaxis: Eliquis Code Status DNR Family Communication: None Disposition Plan: Pending improvement Consultants:  General surgery, cardiology PCCM Procedures: None Antimicrobials: Rocephin  Subjective:  Patient sitting up in bed appears slightly better than yesterday denies nausea vomiting.  Has rectal tube in place, Foley in place Objective: Vitals:   10/28/17 1100 10/28/17 1117 10/28/17 1144 10/28/17 1200  BP:   (!) 150/90 (!) 160/87  Pulse: (!) 114   (!) 106  Resp: (!) 23   18  Temp:  97.8 F (36.6 C)    TempSrc:  Oral    SpO2: 97%   96%  Weight:      Height:        Intake/Output Summary (Last 24 hours) at 10/28/2017 1245 Last data filed at 10/28/2017 0600 Gross per 24 hour  Intake 510.41 ml  Output 1800 ml  Net -1289.59 ml   Filed Weights   10/26/17 0417 10/27/17 0512 10/28/17 0442  Weight: 74.5 kg 72.4 kg 72.4 kg    Examination:  General exam: Appears calm and comfortable  Respiratory system: Crackles bases to auscultation. Respiratory effort normal. Cardiovascular system: S1 & S2 heard, RRR. No JVD, murmurs, rubs, gallops or clicks. No pedal edema. Gastrointestinal system: Abdomen is nondistended, soft and nontender. No organomegaly or masses felt. Normal bowel sounds heard. Central nervous system: Alert and oriented. No focal neurological deficits. Extremities: Symmetric 5 x 5 power. Skin: No rashes, lesions or ulcers Psychiatry: Judgement and insight appear normal. Mood & affect appropriate.     Data Reviewed: I have personally reviewed following labs and imaging studies  CBC: Recent Labs  Lab 10/23/17 1121  10/24/17 0753 10/25/17 0318 10/26/17 0326 10/27/17 0304 10/28/17 0640  WBC 18.0*  --  24.3* 10.8* 8.8 8.4 7.0  NEUTROABS 15.3*  --   --   --   --   --   --   HGB 9.0*   < > 10.0* 9.7* 9.9* 10.0* 9.9*  HCT 29.1*   < > 33.5* 31.9* 32.6* 33.7* 32.8*  MCV  92.7  --  94.1 92.2 92.4 94.9 93.2  PLT 184  --  223 177 203 243 224   < > = values in this interval not displayed.   Basic Metabolic Panel: Recent Labs  Lab 10/24/17 0753 10/25/17 0318 10/26/17 0326 10/27/17 0304 10/28/17 0640  NA 140 140 140 144 147*  K 4.0 3.7 3.7 3.9 3.7  CL 104 105 102 106 106  CO2 27 28 29 28 30   GLUCOSE 100* 147* 155* 146* 135*  BUN 37* 40* 34* 32* 28*  CREATININE 1.56* 1.49* 1.26* 1.12 0.94  CALCIUM 8.6* 8.9  9.2 9.3 9.4  MG 1.6*  --  1.8  --   --   PHOS 3.4  --   --   --   --    GFR: Estimated Creatinine Clearance: 52.6 mL/min (by C-G formula based on SCr of 0.94 mg/dL). Liver Function Tests: Recent Labs  Lab 10/23/17 1121  AST 26  ALT 15  ALKPHOS 53  BILITOT 0.9  PROT 5.1*  ALBUMIN 2.6*   No results for input(s): LIPASE, AMYLASE in the last 168 hours. No results for input(s): AMMONIA in the last 168 hours. Coagulation Profile: Recent Labs  Lab 10/23/17 1121  INR 3.22   Cardiac Enzymes: No results for input(s): CKTOTAL, CKMB, CKMBINDEX, TROPONINI in the last 168 hours. BNP (last 3 results) No results for input(s): PROBNP in the last 8760 hours. HbA1C: No results for input(s): HGBA1C in the last 72 hours. CBG: Recent Labs  Lab 10/23/17 1126 10/27/17 2013 10/27/17 2331 10/28/17 0331  GLUCAP 117* 128* 136* 145*   Lipid Profile: No results for input(s): CHOL, HDL, LDLCALC, TRIG, CHOLHDL, LDLDIRECT in the last 72 hours. Thyroid Function Tests: No results for input(s): TSH, T4TOTAL, FREET4, T3FREE, THYROIDAB in the last 72 hours. Anemia Panel: No results for input(s): VITAMINB12, FOLATE, FERRITIN, TIBC, IRON, RETICCTPCT in the last 72 hours. Sepsis Labs: Recent Labs  Lab 10/23/17 1121  LATICACIDVEN 3.20*    Recent Results (from the past 240 hour(s))  Culture, blood (Routine x 2)     Status: None (Preliminary result)   Collection Time: 10/23/17 11:05 AM  Result Value Ref Range Status   Specimen Description   Final    BLOOD LEFT ANTECUBITAL Performed at [facility] [address], [place], [place] [address]    Special Requests   Final    BOTTLES DRAWN AEROBIC AND ANAEROBIC Blood Culture adequate volume Performed at [facility] [address], [place], [place] [address]    Culture   Final    NO GROWTH 4 DAYS Performed at [facility], [place] [address] [place], [place] [address]    Report Status PENDING  Incomplete    Culture, blood (Routine x 2)     Status: None (Preliminary result)   Collection Time: 10/23/17 11:13 AM  Result Value Ref Range Status   Specimen Description   Final    BLOOD RIGHT ANTECUBITAL Performed at [facility] [address] [place], [place] [address]    Special Requests   Final    BOTTLES DRAWN AEROBIC AND ANAEROBIC Blood Culture adequate volume Performed at [facility] [address], [place], [place] [address]    Culture   Final    NO GROWTH 4 DAYS Performed at [facility], [place] [address], [place], [place] [address]    Report Status PENDING  Incomplete  Urine culture     Status:  Abnormal   Collection Time: 10/23/17 11:21 AM  Result Value Ref Range Status   Specimen Description   Final    URINE, CATHETERIZED Performed at [facility] [address], [place], [place] [address]    Special Requests   Final    NONE Performed at [facility], [place] [address], [place], [place] [address]    Culture MULTIPLE SPECIES PRESENT, SUGGEST RECOLLECTION (A)  Final   Report Status 10/25/2017 FINAL  Final  MRSA PCR Screening     Status: None   Collection Time: 10/23/17  3:59 PM  Result Value Ref Range Status   MRSA by PCR NEGATIVE NEGATIVE Final    Comment:        The GeneXpert MRSA Assay (FDA approved for NASAL specimens only), is one component of a comprehensive MRSA colonization surveillance program. It is not intended to diagnose MRSA infection nor to guide or monitor treatment for MRSA infections. Performed at [facility], [place] [address], [place], [place] [address]          Radiology Studies: No results found.      Scheduled Meds: . chlorhexidine  15 mL Mouth Rinse BID  . diltiazem  60 mg Oral Q8H  . mouth rinse  15 mL Mouth Rinse q12n4p  . metoprolol tartrate  75 mg Oral BID  . polycarbophil  625 mg Oral Daily   Continuous Infusions: . sodium  chloride Stopped (10/27/17 2238)  . cefTRIAXone (ROCEPHIN)  IV Stopped (10/27/17 2308)  . heparin 1,100 Units/hr (10/28/17 1150)     LOS: 5 days    Georgette Shell, MD [facility], please contact night-coverage [email or web address] Password [email or web address] 10/28/2017, 12:45 PM

## 2017-10-28 NOTE — Progress Notes (Addendum)
ANTICOAGULATION CONSULT NOTE - Initial Consult  Pharmacy Consult for Heparin drip Indication: atrial fibrillation  Allergies  Allergen Reactions  . Ace Inhibitors Cough  . Codeine Other (See Comments)    GI Upset  . Indomethacin Other (See Comments)    Elevated BP  . Losartan Swelling    Angioedema    Patient Measurements: Height: 5\' 8"  (172.7 cm) Weight: 159 lb 9.8 oz (72.4 kg) IBW/kg (Calculated) : 68.4 Heparin Dosing Weight: 72 kg  Vital Signs: Temp: 97.4 F (36.3 C) (10/14 0909) Temp Source: Oral (10/14 0909) BP: 147/74 (10/14 0800) Pulse Rate: 113 (10/14 0800)  Labs: Recent Labs    10/26/17 0326 10/27/17 0304 10/28/17 0640  HGB 9.9* 10.0* 9.9*  HCT 32.6* 33.7* 32.8*  PLT 203 243 224  CREATININE 1.26* 1.12 0.94    Estimated Creatinine Clearance: 52.6 mL/min (by C-G formula based on SCr of 0.94 mg/dL).   Medical History: Past Medical History:  Diagnosis Date  . Abnormal prostate exam   . Arthritis    Gout   . Asthma    as a child  . Carotid stenosis   . Carotid stenosis right side   Dr.Early does carotid ultrasounds yrly-last one Mar 14-report in epic  . Cholelithiasis   . COPD (chronic obstructive pulmonary disease) (HCC)    slight   . Enlarged prostate    slightly   . Essential hypertension, benign    takes Clonodine,Losartan,and Metoprolol and HCTZ  . Gout    no meds required  . History of bronchitis   . History of colon polyps   . History of shingles   . Lung cancer ([place]) dx'd 2014   LLL resection; xrt  . Mixed hyperlipidemia    takes Pravastatin daily  . PAC (premature atrial contraction)   . Pneumonia 02/08/2012  . PVCs (premature ventricular contractions)   . Radiation 12/13/14-12/24/14   left upper lobe nodule 50 gray  . Renal artery stenosis (HCC)    right  . Renal insufficiency   . Skin cancer    Lung Ca- Left Lung  . Type II or unspecified type diabetes mellitus without mention of complication, not stated as uncontrolled     02/14/2016 diet controlled    Medications:  Apixaban 5 mg bid PTA  Assessment: 84 y/o M with a h/o chronic atrial fibrillation on apixaban to transition to heparin drip in anticipation of possible surgery for colovesical fistula.   Goal of Therapy:  aPTT 66-102s Heparin level 0.3-0.7 units/ml Monitor platelets by anticoagulation protocol: Yes   Plan:  Start heparin infusion at 1100 units/hr Baseline HL/aPTTT, will need to initially dose with aPTT Check aPTT in 8 hours Continue to monitor H&H and platelets  Arnola Crittendon D 10/28/2017,10:46 AM

## 2017-10-29 ENCOUNTER — Inpatient Hospital Stay (HOSPITAL_COMMUNITY): Payer: Medicare Other

## 2017-10-29 DIAGNOSIS — R0902 Hypoxemia: Secondary | ICD-10-CM

## 2017-10-29 DIAGNOSIS — I482 Chronic atrial fibrillation, unspecified: Secondary | ICD-10-CM

## 2017-10-29 LAB — BASIC METABOLIC PANEL
ANION GAP: 9 (ref 5–15)
Anion gap: 6 (ref 5–15)
BUN: 26 mg/dL — AB (ref 8–23)
BUN: 24 mg/dL — ABNORMAL HIGH (ref 8–23)
CHLORIDE: 109 mmol/L (ref 98–111)
CO2: 30 mmol/L (ref 22–32)
CO2: 31 mmol/L (ref 22–32)
Calcium: 9.6 mg/dL (ref 8.9–10.3)
Calcium: 9.2 mg/dL (ref 8.9–10.3)
Chloride: 111 mmol/L (ref 98–111)
Creatinine, Ser: 0.95 mg/dL (ref 0.61–1.24)
Creatinine, Ser: 0.78 mg/dL (ref 0.61–1.24)
GFR calc Af Amer: >60 mL/min (ref >60)
GFR calc non Af Amer: >60 mL/min (ref >60)
GFR calc non Af Amer: >60 mL/min (ref >60)
Glucose, Bld: 140 mg/dL — ABNORMAL HIGH (ref 70–99)
Glucose, Bld: 180 mg/dL — ABNORMAL HIGH (ref 70–99)
POTASSIUM: 3.7 mmol/L (ref 3.5–5.1)
POTASSIUM: 3.6 mmol/L (ref 3.5–5.1)
SODIUM: 146 mmol/L — AB (ref 135–145)
Sodium: 150 mmol/L — ABNORMAL HIGH (ref 135–145)

## 2017-10-29 LAB — CBC
HEMATOCRIT: 35.9 % — AB (ref 39.0–52.0)
Hemoglobin: 10.6 g/dL — ABNORMAL LOW (ref 13.0–17.0)
MCH: 28.1 pg (ref 26.0–34.0)
MCHC: 29.5 g/dL — ABNORMAL LOW (ref 30.0–36.0)
MCV: 95.2 fL (ref 80.0–100.0)
NRBC: 0 % (ref 0.0–0.2)
Platelets: 244 10*3/uL (ref 150–400)
RBC: 3.77 MIL/uL — AB (ref 4.22–5.81)
RDW: 15.7 % — ABNORMAL HIGH (ref 11.5–15.5)
WBC: 9.3 10*3/uL (ref 4.0–10.5)

## 2017-10-29 LAB — CULTURE, BLOOD (ROUTINE X 2)
Culture: NO GROWTH
Culture: NO GROWTH
SPECIAL REQUESTS: ADEQUATE
Special Requests: ADEQUATE

## 2017-10-29 LAB — APTT: aPTT: 77 seconds — ABNORMAL HIGH (ref 24–36)

## 2017-10-29 LAB — HEPARIN LEVEL (UNFRACTIONATED): Heparin Unfractionated: >2.2 IU/mL — ABNORMAL HIGH (ref 0.30–0.70)

## 2017-10-29 MED ORDER — DEXTROSE 5 % IV SOLN
INTRAVENOUS | Status: DC
  Administered 2017-10-29: 12:00:00 via INTRAVENOUS

## 2017-10-29 MED ORDER — DILTIAZEM HCL ER COATED BEADS 180 MG PO CP24
180.0000 mg | ORAL_CAPSULE | Freq: Every day | ORAL | Status: DC
  Administered 2017-10-29 – 2017-10-30 (×2): 180 mg via ORAL
  Filled 2017-10-29 (×2): qty 1

## 2017-10-29 MED ORDER — FUROSEMIDE 10 MG/ML IJ SOLN
INTRAMUSCULAR | Status: AC
  Filled 2017-10-29: qty 2

## 2017-10-29 MED ORDER — LIP MEDEX EX OINT
TOPICAL_OINTMENT | CUTANEOUS | Status: AC
  Administered 2017-10-29: 17:00:00
  Filled 2017-10-29: qty 7

## 2017-10-29 MED ORDER — FUROSEMIDE 10 MG/ML IJ SOLN
20.0000 mg | Freq: Once | INTRAMUSCULAR | Status: AC
  Administered 2017-10-29: 20 mg via INTRAVENOUS

## 2017-10-29 MED ORDER — APIXABAN 5 MG PO TABS
5.0000 mg | ORAL_TABLET | Freq: Two times a day (BID) | ORAL | Status: DC
  Administered 2017-10-29 – 2017-10-30 (×3): 5 mg via ORAL
  Filled 2017-10-29 (×3): qty 1

## 2017-10-29 MED ORDER — MORPHINE SULFATE (PF) 2 MG/ML IV SOLN
1.0000 mg | INTRAVENOUS | Status: DC | PRN
  Administered 2017-10-30: 1 mg via INTRAVENOUS
  Filled 2017-10-29: qty 1

## 2017-10-29 NOTE — Progress Notes (Signed)
Progress Note  Patient Name: Benjamin Patel Date of Encounter: 10/29/2017  Primary Cardiologist: Candee Furbish, MD  Subjective   Pt denies CP, dyspnea or abdominal pain  Inpatient Medications    Scheduled Meds: . chlorhexidine  15 mL Mouth Rinse BID  . diltiazem  60 mg Oral Q8H  . mouth rinse  15 mL Mouth Rinse q12n4p  . metoprolol tartrate  75 mg Oral BID  . polycarbophil  625 mg Oral Daily   Continuous Infusions: . sodium chloride Stopped (10/27/17 2238)  . cefTRIAXone (ROCEPHIN)  IV Stopped (10/28/17 2138)  . heparin 1,000 Units/hr (10/28/17 2230)   PRN Meds: sodium chloride, acetaminophen, iopamidol, morphine injection, traZODone   Vital Signs    Vitals:   10/29/17 0500 10/29/17 0600 10/29/17 0700 10/29/17 0800  BP: (!) 157/81 (!) 152/68 (!) 148/62 (!) 144/84  Pulse: (!) 109 (!) 103 95 (!) 105  Resp: (!) 22 19 (!) 22 18  Temp:    (!) 97.5 F (36.4 C)  TempSrc:    Oral  SpO2: 93% 92% (!) 88% 94%  Weight:      Height:        Intake/Output Summary (Last 24 hours) at 10/29/2017 1010 Last data filed at 10/29/2017 0800 Gross per 24 hour  Intake 228.98 ml  Output 300 ml  Net -71.02 ml   Filed Weights   10/27/17 0512 10/28/17 0442 10/29/17 0408  Weight: 72.4 kg 72.4 kg 73.7 kg    Telemetry    Atrial fibrillation rate controlled - Personally Reviewed  Physical Exam   GEN: No acute distress.  Thin, elderly Neck: supple Cardiac: irregular Respiratory: Diminished BS bases GI: Soft, NT MS: 1+ edema Neuro:  Nonfocal    Labs    Chemistry Recent Labs  Lab 10/23/17 1121  10/27/17 0304 10/28/17 0640 10/29/17 0705  NA 136   < > 144 147* 150*  K 4.6   < > 3.9 3.7 3.7  CL 97*   < > 106 106 111  CO2 30   < > 28 30 30   GLUCOSE 126*   < > 146* 135* 140*  BUN 43*   < > 32* 28* 26*  CREATININE 1.99*   < > 1.12 0.94 0.95  CALCIUM 9.0   < > 9.3 9.4 9.6  PROT 5.1*  --   --   --   --   ALBUMIN 2.6*  --   --   --   --   AST 26  --   --   --   --   ALT  15  --   --   --   --   ALKPHOS 53  --   --   --   --   BILITOT 0.9  --   --   --   --   GFRNONAA 28*   < > 57* >60 >60  GFRAA 33*   < > >60 >60 >60  ANIONGAP 9   < > 10 11 9    < > = values in this interval not displayed.     Hematology Recent Labs  Lab 10/27/17 0304 10/28/17 0640 10/29/17 0705  WBC 8.4 7.0 9.3  RBC 3.55* 3.52* 3.77*  HGB 10.0* 9.9* 10.6*  HCT 33.7* 32.8* 35.9*  MCV 94.9 93.2 95.2  MCH 28.2 28.1 28.1  MCHC 29.7* 30.2 29.5*  RDW 15.5 15.7* 15.7*  PLT 243 224 244   Recent Labs  Lab 10/23/17 1124  TROPIPOC 0.00  Patient Profile     84 y.o. 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 He is being seen today for preoperative cardiovascular examination for surgical clearance, at the request of Dr. Zigmund Daniel, Internal Medicine, and Dr. Kae Heller, general surgery.   Assessment & Plan    1 preoperative evaluation prior to repair of colovesical fistula-as outlined in previous notes patient will be at increased risk for surgery given age and other comorbidities.  However would not pursue further ischemia evaluation preoperatively as he has not had chest pain.  Note however patient has decided against surgery.  2 permanent atrial fibrillation-continue Cardizem (change to 180 mg daily) and metoprolol for rate control of atrial fibrillation. DC heparin and resume apixaban 5 mg twice daily as he has decided against surgery.  3 history of coronary calcification-would consider statin following discharge.  4 urosepsis and colovesical fistula-Per  general surgery and primary service.  5 hyponatremia-would add free water and follow.  Cardiology will follow from a distance.  For questions or updates, please contact [place] Please consult [email or web address] for contact info under        Signed, Kirk Ruths, MD  10/29/2017, 10:10 AM

## 2017-10-29 NOTE — Progress Notes (Signed)
PMT no charge note  Consult request received Chart reviewed, it is noted that the patient has discussed with [facility] MD, Surgery, Cardiology, urology has also been following. Initially, there were plans being made for fecal diversion surgery, possible colostomy, as the patient is deemed to have a colo vesical fistula, he has been admitted with urosepsis. He has underlying COPD, A fib PAD III CKD.   At bedside, I met with a family member, she stated that the patient had made up his mind, and wanted to go home with hospice services.   Care manager note reviewed.   Discussed briefly with [facility] liaison Erling Conte, who has met with the patient and his family. Goals are clear, for no surgery, for patient to go home with hospice. Prognosis likely appears to be few weeks at this point, there being a high risk of worsening fistula and infection.   At this time, will not complete a full consult, goals of care have been outlined and home with hospice plans are being implemented. Please call us if we can be of further assistance.   Thank you for the consult.   Loistine Chance, MD [facility] palliative medicine team [phone]

## 2017-10-29 NOTE — Progress Notes (Signed)
[name])  Received request from [facility] for family interest in hospice services at home after discharge. Chart reviewed and eligibility has been confirmed.   Met with patient and spouse to explain services including hospice philosophy and team approach to care. Answered their questions and offered support as both are surprised to be at this place. Both confirmed goals for comfort focused care with no surgery or aggressive curative care. Patient would like to return home as soon as possible. DME needs discussed and per their wishes hospital bed with split rails, OBT, RW and oxygen setup have been ordered from [facility]. Spouse is contact for DME delivery [phone]. She is hopeful for delivery this evening so he can return home tomorrow via [facility].   [facility] referral center specialist will contact spouse to arrange admission visit at home after discharge.   Please send home with patient signed and completed out of facility DNR.   Please send home with patient script for any medication he does not already have including comfort medications.   RNCM Suanne Marker has been updated.   Thank you,  Erling Conte, LCSW [phone]

## 2017-10-29 NOTE — Care Management Note (Addendum)
Case Management Note  Patient Details  Name: Benjamin Patel MRN: [id number] Date of Birth: 1927-05-16  Subjective/Objective:                  Discharge planning for home hospice  Action/Plan: Choose [facility] for the home hospice, tct-Mary Webb Silversmith with [facility] alerted of need for home hospice pt name and location left on voice mail with request to return call. [facility]-EVA DAVIS with hospice and palliative care, will see patient and arrange needed care. Expected Discharge Date:  (unknown)               Expected Discharge Plan:  Home/Self Care  In-House Referral:     Discharge planning Services  CM Consult  Post Acute Care Choice:    Choice offered to:     DME Arranged:    DME Agency:     HH Arranged:    HH Agency:     Status of Service:  In process, will continue to follow  If discussed at Long Length of Stay Meetings, dates discussed:    Additional Comments:  Leeroy Cha, RN 10/29/2017, 2:23 PM

## 2017-10-29 NOTE — Progress Notes (Signed)
PROGRESS NOTE    Benjamin Patel  ZYS:[id number] DOB: 05-Apr-1927 DOA: 10/23/2017 PCP: Deland Pretty, MD  :84 yo male admitted 10/9 with septic shock urosepsistreated with several antibiotics and pressors and culture showed multiple organisms and fecaluria no work-up is going on for colovesical fistula. He was initially treated with meropenem no change Rocephin as no resistant organisms had been identified. He is followed by urology. Urology thinks he probably has a colovesical fistula. He has been evaluated by general surgery who feels a fecal diversion is likely the best course of action.  [facility] assumed care 10/25/2017 Chest x-ray 10/25/2017 shows new bilateral pleural effusion  10/29/2017 patient asking to go home.  He is adamant about not wanting to have surgery done.  Wife is very concerned about this.  She feels not capable of taking care of him at home.  PT has recommended patient to be discharged to skilled nursing facility.  Assessment & Plan:   Active Problems:   Septic shock (HCC)   Hypotension   Fever   Urinary tract infection without hematuria   SOB (shortness of breath)  1]Colovesical fistula with urosepsis and fecaluria on ceftriaxoneleukocytosis improved WBC back to normal.    Patient refusing to have surgery done.  He is requesting to be discharged home.  However he is too weak and the wife cannot take care of him at home.  Will consult social worker for skilled nursing facility placement.  2]status post sepsis with septic shock currently off pressors continue ceftriaxone  3]diarrhea resolved  4]atrial fibrillation on Cardizem and metoprolol and Eliquis.  Seen by cardiology.  5]AKI on CKD Baseline creatinine 1.4-1.5. It peaked at 1.99 improving with IV hydration. Creatinine today .95.  6]diet-controlled type 2 diabetesstable  7]anemia of chronic disease stable  8]new bilateral pleural effusion by chest x-ray will give a one-time dose of Lasix 20  mg.  Echocardiogram shows ejection fraction 50 to 55% with normal wall thickness and wall motion.  9] hyponatremia we will give him D5 and follow-up labs later today.    DVT prophylaxis: Eliquis Code Status: DO NOT RESUSCITATE Family Communication: None Disposition Plan: Pending consult social worker for SNF placement as patient refusing to have surgery  Consultants: General surgery, cardiology, PCCM  Procedures: None Antimicrobials: Rocephin  Subjective: Patient resting in bed awake alert wanting to be discharged home he feels his breathing is okay but he appears more short of breath.  Objective: Vitals:   10/29/17 0600 10/29/17 0700 10/29/17 0800 10/29/17 1000  BP: (!) 152/68 (!) 148/62 (!) 144/84 (!) 152/77  Pulse: (!) 103 95 (!) 105 (!) 101  Resp: 19 (!) 22 18 19   Temp:   (!) 97.5 F (36.4 C)   TempSrc:   Oral   SpO2: 92% (!) 88% 94% 97%  Weight:      Height:        Intake/Output Summary (Last 24 hours) at 10/29/2017 1035 Last data filed at 10/29/2017 1000 Gross per 24 hour  Intake 488.98 ml  Output 300 ml  Net 188.98 ml   Filed Weights   10/27/17 0512 10/28/17 0442 10/29/17 0408  Weight: 72.4 kg 72.4 kg 73.7 kg    Examination:  General exam: Appears calm and comfortable  Respiratory system: Diminished breath sounds in bilateral bases to auscultation. Respiratory effort normal. Cardiovascular system: S1 & S2 heard, RRR. No JVD, murmurs, rubs, gallops or clicks. No pedal edema. Gastrointestinal system: Abdomen is nondistended, soft and nontender. No organomegaly or masses felt. Normal bowel  sounds heard. Central nervous system: Alert and oriented. No focal neurological deficits. Extremities: Symmetric 5 x 5 power. Skin: No rashes, lesions or ulcers Psychiatry: Judgement and insight appear normal. Mood & affect appropriate.     Data Reviewed: I have personally reviewed following labs and imaging studies  CBC: Recent Labs  Lab 10/23/17 1121   10/25/17 0318 10/26/17 0326 10/27/17 0304 10/28/17 0640 10/29/17 0705  WBC 18.0*   < > 10.8* 8.8 8.4 7.0 9.3  NEUTROABS 15.3*  --   --   --   --   --   --   HGB 9.0*   < > 9.7* 9.9* 10.0* 9.9* 10.6*  HCT 29.1*   < > 31.9* 32.6* 33.7* 32.8* 35.9*  MCV 92.7   < > 92.2 92.4 94.9 93.2 95.2  PLT 184   < > 177 203 243 224 244   < > = values in this interval not displayed.   Basic Metabolic Panel: Recent Labs  Lab 10/24/17 0753 10/25/17 0318 10/26/17 0326 10/27/17 0304 10/28/17 0640 10/29/17 0705  NA 140 140 140 144 147* 150*  K 4.0 3.7 3.7 3.9 3.7 3.7  CL 104 105 102 106 106 111  CO2 27 28 29 28 30 30   GLUCOSE 100* 147* 155* 146* 135* 140*  BUN 37* 40* 34* 32* 28* 26*  CREATININE 1.56* 1.49* 1.26* 1.12 0.94 0.95  CALCIUM 8.6* 8.9 9.2 9.3 9.4 9.6  MG 1.6*  --  1.8  --   --   --   PHOS 3.4  --   --   --   --   --    GFR: Estimated Creatinine Clearance: 52 mL/min (by C-G formula based on SCr of 0.95 mg/dL). Liver Function Tests: Recent Labs  Lab 10/23/17 1121  AST 26  ALT 15  ALKPHOS 53  BILITOT 0.9  PROT 5.1*  ALBUMIN 2.6*   No results for input(s): LIPASE, AMYLASE in the last 168 hours. No results for input(s): AMMONIA in the last 168 hours. Coagulation Profile: Recent Labs  Lab 10/23/17 1121  INR 3.22   Cardiac Enzymes: No results for input(s): CKTOTAL, CKMB, CKMBINDEX, TROPONINI in the last 168 hours. BNP (last 3 results) No results for input(s): PROBNP in the last 8760 hours. HbA1C: No results for input(s): HGBA1C in the last 72 hours. CBG: Recent Labs  Lab 10/23/17 1126 10/27/17 2013 10/27/17 2331 10/28/17 0331  GLUCAP 117* 128* 136* 145*   Lipid Profile: No results for input(s): CHOL, HDL, LDLCALC, TRIG, CHOLHDL, LDLDIRECT in the last 72 hours. Thyroid Function Tests: No results for input(s): TSH, T4TOTAL, FREET4, T3FREE, THYROIDAB in the last 72 hours. Anemia Panel: No results for input(s): VITAMINB12, FOLATE, FERRITIN, TIBC, IRON, RETICCTPCT  in the last 72 hours. Sepsis Labs: Recent Labs  Lab 10/23/17 1121  LATICACIDVEN 3.20*    Recent Results (from the past 240 hour(s))  Culture, blood (Routine x 2)     Status: None (Preliminary result)   Collection Time: 10/23/17 11:05 AM  Result Value Ref Range Status   Specimen Description   Final    BLOOD LEFT ANTECUBITAL Performed at [facility] [address], [place], [place] [address]    Special Requests   Final    BOTTLES DRAWN AEROBIC AND ANAEROBIC Blood Culture adequate volume Performed at [facility] [address], [place], [place] [address]    Culture   Final    NO GROWTH 4 DAYS Performed at [facility],  [address] [place], [place] [address]    Report Status PENDING  Incomplete  Culture, blood (Routine x 2)     Status: None (Preliminary result)   Collection Time: 10/23/17 11:13 AM  Result Value Ref Range Status   Specimen Description   Final    BLOOD RIGHT ANTECUBITAL Performed at [facility] [address] [place], [place] [address]    Special Requests   Final    BOTTLES DRAWN AEROBIC AND ANAEROBIC Blood Culture adequate volume Performed at [facility] [address] [place], [place] [address]    Culture   Final    NO GROWTH 4 DAYS Performed at [facility], [place] [address], [place], [place] [address]    Report Status PENDING  Incomplete  Urine culture     Status: Abnormal   Collection Time: 10/23/17 11:21 AM  Result Value Ref Range Status   Specimen Description   Final    URINE, CATHETERIZED Performed at [facility] [address] [place], [place] [address]    Special Requests   Final    NONE Performed at [facility], [place] [address] [place],  [address]    Culture MULTIPLE SPECIES PRESENT, SUGGEST RECOLLECTION (A)  Final   Report Status 10/25/2017 FINAL  Final  MRSA PCR Screening     Status: None    Collection Time: 10/23/17  3:59 PM  Result Value Ref Range Status   MRSA by PCR NEGATIVE NEGATIVE Final    Comment:        The GeneXpert MRSA Assay (FDA approved for NASAL specimens only), is one component of a comprehensive MRSA colonization surveillance program. It is not intended to diagnose MRSA infection nor to guide or monitor treatment for MRSA infections. Performed at [facility], [place] [address], [place],  [address]          Radiology Studies: No results found.      Scheduled Meds: . apixaban  5 mg Oral BID  . chlorhexidine  15 mL Mouth Rinse BID  . diltiazem  180 mg Oral Daily  . mouth rinse  15 mL Mouth Rinse q12n4p  . metoprolol tartrate  75 mg Oral BID  . polycarbophil  625 mg Oral Daily   Continuous Infusions: . sodium chloride Stopped (10/27/17 2238)  . cefTRIAXone (ROCEPHIN)  IV Stopped (10/28/17 2138)  . dextrose       LOS: 6 days     Georgette Shell, MD [facility]  If 7PM-7AM, please contact night-coverage [email or web address] Password [email or web address] 10/29/2017, 10:35 AM

## 2017-10-29 NOTE — Progress Notes (Signed)
ANTICOAGULATION CONSULT NOTE - [place] for Heparin drip Indication: atrial fibrillation  Allergies  Allergen Reactions  . Ace Inhibitors Cough  . Codeine Other (See Comments)    GI Upset  . Indomethacin Other (See Comments)    Elevated BP  . Losartan Swelling    Angioedema    Patient Measurements: Height: 5\' 8"  (172.7 cm) Weight: 162 lb 7.7 oz (73.7 kg) IBW/kg (Calculated) : 68.4 Heparin Dosing Weight: actual body weight  Vital Signs: Temp: 97.5 F (36.4 C) (10/15 0408) Temp Source: Axillary (10/15 0408) BP: 148/62 (10/15 0700) Pulse Rate: 95 (10/15 0700)  Labs: Recent Labs    10/27/17 0304 10/28/17 0640 10/28/17 1104 10/28/17 2027 10/29/17 0705  HGB 10.0* 9.9*  --   --  10.6*  HCT 33.7* 32.8*  --   --  35.9*  PLT 243 224  --   --  244  APTT  --   --  39* 110* 77*  HEPARINUNFRC  --   --  >2.20*  --   --   CREATININE 1.12 0.94  --   --   --     Estimated Creatinine Clearance: 52.6 mL/min (by C-G formula based on SCr of 0.94 mg/dL).   Medications:  Apixaban 5 mg BID PTA-last dose inpatient 10/27/17 at 2229  Assessment: 84 y/o M with a h/o chronic atrial fibrillation on apixaban. Pharmacy asked to transition patient to heparin drip in anticipation of possible surgery for colovesical fistula.    aPTT = 77 seconds, at goal  HL= > 2.2  CBC: Hgb 10.6, Pltc WNL  No bleeding or infusion issues per nursing  Goal of Therapy:  aPTT 66-102s Heparin level 0.3-0.7 units/ml Monitor platelets by anticoagulation protocol: Yes   Plan:   Continue heparin infusion at 1000 units/hr.   aPTT  8 hours to confirm; adjusting based on aPTT for now until aPTT and heparin level correlate and thus effects of Apixaban have diminished.   Daily CBC and heparin level  Monitor closely for s/sx of bleeding  F/u surgery plans  Ulice Dash, PharmD Clinical Pharmacist Pager # [phone]  10/29/17 7:56 AM

## 2017-10-29 NOTE — Progress Notes (Signed)
Patient ID: Benjamin Patel, male   DOB: 04-08-1927, 84 y.o.   MRN: [id number]       Subjective: Patient much  More adamant today about going home today and not wanting surgery.  He wants to go home and get stronger before he makes any decisions.  He denies SOB or increase WOB, but O2 requirements increased to 4L and only sating between 89-94%.  Objective: Vital signs in last 24 hours: Temp:  [97.1 F (36.2 C)-97.8 F (36.6 C)] 97.5 F (36.4 C) (10/15 0408) Pulse Rate:  [84-124] 95 (10/15 0700) Resp:  [15-27] 22 (10/15 0700) BP: (128-160)/(55-90) 148/62 (10/15 0700) SpO2:  [88 %-98 %] 88 % (10/15 0700) Weight:  [73.7 kg] 73.7 kg (10/15 0408) Last BM Date: 10/28/17  Intake/Output from previous day: 10/14 0701 - 10/15 0700 In: 219 [P.O.:120; I.V.:99] Out: 300 [Stool:300] Intake/Output this shift: No intake/output data recorded.  PE: Heart: irregular, tachy Lungs: decrease BS at bases, 4L O2, sats between 89-94% Abd: soft, NT, ND, +BS, foley with feculent output Psych: A&O appropriately but seems much more adamant today and strong-willed which is different from his very calm manner yesterday.  Wife has noticed this as well.  Lab Results:  Recent Labs    10/28/17 0640 10/29/17 0705  WBC 7.0 9.3  HGB 9.9* 10.6*  HCT 32.8* 35.9*  PLT 224 244   BMET Recent Labs    10/28/17 0640 10/29/17 0705  NA 147* 150*  K 3.7 3.7  CL 106 111  CO2 30 30  GLUCOSE 135* 140*  BUN 28* 26*  CREATININE 0.94 0.95  CALCIUM 9.4 9.6   PT/INR No results for input(s): LABPROT, INR in the last 72 hours. CMP     Component Value Date/Time   NA 150 (H) 10/29/2017 0705   NA 139 08/05/2017 1018   K 3.7 10/29/2017 0705   CL 111 10/29/2017 0705   CO2 30 10/29/2017 0705   GLUCOSE 140 (H) 10/29/2017 0705   BUN 26 (H) 10/29/2017 0705   BUN 23 08/05/2017 1018   CREATININE 0.95 10/29/2017 0705   CREATININE 1.26 (H) 08/17/2014 0928   CALCIUM 9.6 10/29/2017 0705   PROT 5.1 (L) 10/23/2017 1121     ALBUMIN 2.6 (L) 10/23/2017 1121   AST 26 10/23/2017 1121   ALT 15 10/23/2017 1121   ALKPHOS 53 10/23/2017 1121   BILITOT 0.9 10/23/2017 1121   GFRNONAA >60 10/29/2017 0705   GFRNONAA 52 (L) 08/17/2014 0928   GFRAA >60 10/29/2017 0705   GFRAA 60 08/17/2014 0928   Lipase  No results found for: LIPASE     Studies/Results: No results found.  Anti-infectives: Anti-infectives (From admission, onward)   Start     Dose/Rate Route Frequency Ordered Stop   10/25/17 2200  cefTRIAXone (ROCEPHIN) 2 g in sodium chloride 0.9 % 100 mL IVPB     2 g 200 mL/hr over 30 Minutes Intravenous Every 24 hours 10/25/17 1008     10/24/17 1000  vancomycin (VANCOCIN) IVPB 1000 mg/200 mL premix  Status:  Discontinued     1,000 mg 200 mL/hr over 60 Minutes Intravenous Every 36 hours 10/23/17 1609 10/24/17 1023   10/23/17 2000  meropenem (MERREM) 1 g in sodium chloride 0.9 % 100 mL IVPB  Status:  Discontinued     1 g 200 mL/hr over 30 Minutes Intravenous Every 12 hours 10/23/17 1633 10/25/17 1000   10/23/17 1145  vancomycin (VANCOCIN) IVPB 1000 mg/200 mL premix     1,000 mg  200 mL/hr over 60 Minutes Intravenous  Once 10/23/17 1140 10/23/17 1507   10/23/17 1130  piperacillin-tazobactam (ZOSYN) IVPB 3.375 g     3.375 g 100 mL/hr over 30 Minutes Intravenous  Once 10/23/17 1120 10/23/17 1200       Assessment/Plan  HTN COPD - increasing O2 requirements PAD CKD III Carotid stenosis HLD Afib Anticoagulation (eliquis)  Colovesical fistula c/b urosepsis- resolving -Continue supportive care, antibiotic therapy -cleared by cardiology for surgical intervention -cont heparin for now, but patient seems very adamant that he does NOT want an operation at this time.  He states he wants to discharge and do therapies to get stronger prior to surgical intervention.  This is not unreasonable, but his desire to leave today and go home is given recommendation by PT is SNF. -discussed with patient and his wife at  the bedside regarding possibilities of further infection etc if no surgery performed.  Patient understands and still does not want an operation.     FEN - CMD VTE - eliquis on hold, on heparin gtt currently in case surgery done. ID - Rocephin   LOS: 6 days    Henreitta Cea , [facility] Surgery 10/29/2017, 7:56 AM Pager: [phone]

## 2017-10-30 DIAGNOSIS — Z515 Encounter for palliative care: Secondary | ICD-10-CM

## 2017-10-30 DIAGNOSIS — N321 Vesicointestinal fistula: Secondary | ICD-10-CM | POA: Clinically undetermined

## 2017-10-30 DIAGNOSIS — I4821 Permanent atrial fibrillation: Secondary | ICD-10-CM | POA: Diagnosis present

## 2017-10-30 DIAGNOSIS — E870 Hyperosmolality and hypernatremia: Secondary | ICD-10-CM | POA: Diagnosis present

## 2017-10-30 DIAGNOSIS — R627 Adult failure to thrive: Secondary | ICD-10-CM | POA: Diagnosis present

## 2017-10-30 DIAGNOSIS — Z7189 Other specified counseling: Secondary | ICD-10-CM

## 2017-10-30 LAB — BASIC METABOLIC PANEL
ANION GAP: 8 (ref 5–15)
BUN: 25 mg/dL — AB (ref 8–23)
CHLORIDE: 111 mmol/L (ref 98–111)
CO2: 31 mmol/L (ref 22–32)
Calcium: 9.4 mg/dL (ref 8.9–10.3)
Creatinine, Ser: 0.94 mg/dL (ref 0.61–1.24)
Glucose, Bld: 136 mg/dL — ABNORMAL HIGH (ref 70–99)
POTASSIUM: 3.5 mmol/L (ref 3.5–5.1)
SODIUM: 150 mmol/L — AB (ref 135–145)

## 2017-10-30 LAB — CBC
HCT: 35 % — ABNORMAL LOW (ref 39.0–52.0)
HEMOGLOBIN: 10.2 g/dL — AB (ref 13.0–17.0)
MCH: 27.7 pg (ref 26.0–34.0)
MCHC: 29.1 g/dL — ABNORMAL LOW (ref 30.0–36.0)
MCV: 95.1 fL (ref 80.0–100.0)
NRBC: 0 % (ref 0.0–0.2)
PLATELETS: 244 10*3/uL (ref 150–400)
RBC: 3.68 MIL/uL — AB (ref 4.22–5.81)
RDW: 15.5 % (ref 11.5–15.5)
WBC: 10 10*3/uL (ref 4.0–10.5)

## 2017-10-30 MED ORDER — MORPHINE SULFATE (CONCENTRATE) 10 MG /0.5 ML PO SOLN: 5.0000 mg | ORAL | 0 refills | Status: AC | PRN

## 2017-10-30 MED ORDER — TRAZODONE HCL 50 MG PO TABS: 50.0000 mg | ORAL_TABLET | Freq: Every evening | ORAL | 0 refills | Status: AC | PRN

## 2017-10-30 NOTE — Discharge Summary (Addendum)
Physician Discharge Summary  Benjamin Patel NWG:[id number] DOB: 15-Feb-1927 DOA: 10/23/2017  PCP: Deland Pretty, MD  Admit date: 10/23/2017 Discharge date: 10/30/2017  Admitted From: Home Disposition: Residential hospice ([facility])  Recommendations for Outpatient Follow-up:  Follow up at residential hospice  Equipment/Devices: None  Discharge Condition: Guarded CODE STATUS: DNR Diet recommendation: Regular/comfort feed    Discharge Diagnoses:  Principal Problem:   Septic shock ([place])   Active Problems:   COPD/ GOLD III   DM (diabetes mellitus) type II controlled with renal manifestation (HCC)   CKD (chronic kidney disease) stage 2, GFR 60-89 ml/min   Hypotension   Urinary tract infection without hematuria   Encounter for palliative care   Goals of care, counseling/discussion   Acute hypernatremia   Colovesical fistula   FTT (failure to thrive) in adult   Permanent atrial fibrillation  Brief narrative/HPI Please refer to admission H&P for details, in brief, 84 year old male with hypertension, COPD, CAD, type 2 diabetes mellitus, permanent A. fib on anticoagulation, frequent UTIs was brought in to the ED by EMS for generalized weakness for his frequent UTIs he was recently prescribed doxycycline by his PCP.  Patient was found to be hypotensive by EMS and received 800 cc normal saline bolus, was febrile in the ED, hypertensive with elevated lactic acid meeting criteria for septic shock.  Patient continued to be hypotensive despite fluid bolus and was admitted to ICU.  Blood work showed acute kidney injury as well.  Spell course prolonged due to UTI with sepsis and colovesical fistula.  Hospital course  Severe sepsis with septic shock ([place]) Sepsis currently resolved.  Urine culture growing mixed bacteria.  Blood culture negative.  Required pressors initially along with aggressive hydration.  Also treated with IV Rocephin for >7 days and now discontinued.  Colovesical fistula  with fecalurea. Followed by surgery.  Patient refused any operative intervention and wanted to go home.  Permanent A. fib On Cardizem and metoprolol.  On Eliquis at home which was held and placed on heparin drip anticipating surgery.  Since patient is now being planned for residential hospice I will discontinue his anticoagulation.  Acute kidney injury on chronic kidney disease stage III Baseline creatinine around 1.4, had peaked at 1.99 due to sepsis.  Improved with IV hydration.  Anemia of chronic kidney disease Stable.  Hypernatremia Secondary to sepsis and dehydration.  Received D5.  Diet-controlled type 2 diabetes mellitus CBG stable.  Failure to thrive Seen by palliative care and goals of care discussed.  Patient lives with his wife who herself is quite frail.  Patient refused any surgical intervention and patient and his wife were looking into hospice facilities locally prior to admission.  Patient wished to go home with home hospice.  However he is quite frail and his wife is unable to provide him any physical support.  He is needing at least 2 person assist at this time. Palliative care recommends residential hospice given his guarded prognosis and rapid functional decline and poor p.o. intake. Patient was requiring PRN morphine for pain.  Will prescribe Roxanol for pain and shortness of breath.  Discussed with family ( wife and daughter) who agree with he plan and patient will be discharged to residential hospice.    Procedure: CT abdomen and pelvis 2D echo  Consults: Surgery, cardiology, palliative care  Family communication: Wife at bedside  Disposition: Residential hospice ([facility])   Discharge Instructions   Allergies as of 10/30/2017      Reactions   Ace  Inhibitors Cough   Codeine Other (See Comments)   GI Upset   Indomethacin Other (See Comments)   Elevated BP   Losartan Swelling   Angioedema      Medication List    STOP taking these  medications   apixaban 5 MG Tabs tablet Commonly known as:  ELIQUIS   doxycycline 100 MG tablet Commonly known as:  VIBRA-TABS   MYRBETRIQ 25 MG Tb24 tablet Generic drug:  mirabegron ER     TAKE these medications   allopurinol 100 MG tablet Commonly known as:  ZYLOPRIM Take 100 mg by mouth 2 (two) times daily.   cholecalciferol 1000 units tablet Commonly known as:  VITAMIN D Take 1,000 Units by mouth 2 (two) times daily.   diltiazem 180 MG 24 hr capsule Commonly known as:  CARDIZEM CD Take 1 capsule (180 mg total) by mouth daily.   gabapentin 400 MG capsule Commonly known as:  NEURONTIN Take 400 mg by mouth daily.   metoprolol succinate 100 MG 24 hr tablet Commonly known as:  TOPROL-XL Take 1 tablet (100 mg total) by mouth daily. Take with or immediately following a meal.   morphine CONCENTRATE 10 mg / 0.5 ml concentrated solution Take 0.25 mLs (5 mg total) by mouth every 4 (four) hours as needed for moderate pain, severe pain or shortness of breath.   PROBIOTIC PO Take 1 tablet by mouth daily.   tamsulosin 0.4 MG Caps capsule Commonly known as:  FLOMAX Take 0.4 mg by mouth daily.   traZODone 50 MG tablet Commonly known as:  DESYREL Take 1 tablet (50 mg total) by mouth at bedtime as needed for sleep.      Follow-up Information    follow up at residential hospice Follow up in 1 week(s).          Allergies  Allergen Reactions  . Ace Inhibitors Cough  . Codeine Other (See Comments)    GI Upset  . Indomethacin Other (See Comments)    Elevated BP  . Losartan Swelling    Angioedema     Procedures/Studies: Ct Abdomen Pelvis Wo Contrast  Result Date: 10/25/2017 CLINICAL DATA:  Patient has feces in his Foley catheter. Concern for colovesicular fistula. Rectal contrast normal contrast administered. EXAM: CT ABDOMEN AND PELVIS WITHOUT CONTRAST TECHNIQUE: Multidetector CT imaging of the abdomen and pelvis was performed following the standard protocol without  IV contrast. COMPARISON:  PET-CT 11/10/2014 FINDINGS: Lower chest: Bilateral pleural effusions moderate in volume. Hepatobiliary: No focal hepatic lesion on noncontrast exam. Gallstones layer within the gallbladder. No gallbladder distension. Pancreas: Pancreas is normal. No ductal dilatation. No pancreatic inflammation. Spleen: Normal spleen Adrenals/urinary tract: Kidneys are normal. Nonobstructing calculus in lower pole LEFT kidney. Ureters grossly normal and difficult to follow. There is large calculus within the distal LEFT ureter measuring 12 mm (image 60/2). This calculus is approximately 4 cm from the vascular junction identified on PET-CT scan 11/10/2014. Foley catheter within lumen of the bladder. There is moderate volume of gas within the lumen of bladder. Additionally, there is high-density contrast within the bladder which presumably originates from the bowel. The sigmoid colon approximates the dome of the bladder. There is no clear plane between the bladder and sigmoid colon. Several potential communicating surfaces between the sigmoid colon and bladder. There are multiple diverticular through this region. The small bowel also approximates the dome of the bladder. Enteric contrast via oral route progress into the mid small bowel. Stomach/Bowel: Stomach, small-bowel cecum normal. Appendix normal. Contrast progresses  to the mid small bowel. The ascending and transverse colon normal. Multiple diverticula of the sigmoid colon. Again the colon approximates the bladder wall. Suspicion for colovesicular fistula. Vascular/Lymphatic: Abdominal aorta is normal caliber with atherosclerotic calcification. There is no retroperitoneal or periportal lymphadenopathy. No pelvic lymphadenopathy. Reproductive: Prostate grossly normal. Small focus of gas within the LEFT aspect of the prostate gland. Other: No free fluid. Musculoskeletal: No aggressive osseous lesion. IMPRESSION: 1. Gas and enteric contrast within the  bladder lumen consistent with enterovesicular fistula. Favor colovesicular fistula with the sigmoid colon approximating the bladder wall. Multiple diverticula present through this region of sigmoid colon. The small bowel also approximates the dome of the bladder but less favored to be a source of fistula. 2. Chronic large distal LEFT ureteral calculus.  No obstruction. 3. Moderate bilateral pleural effusions. These results will be called to the ordering clinician or representative by the Radiologist Assistant, and communication documented in the PACS or zVision Dashboard. Electronically Signed   By: Suzy Bouchard M.D.   On: 10/25/2017 16:41   Dg Chest 1 View  Result Date: 10/29/2017 CLINICAL DATA:  Hypoxia EXAM: CHEST  1 VIEW COMPARISON:  October 26, 2017 FINDINGS: Bilateral pleural effusions with underlying opacities, stable. No other interval change. IMPRESSION: Persistent bilateral effusions with underlying opacities. No interval change. Electronically Signed   By: Dorise Bullion III M.D   On: 10/29/2017 11:08   Dg Chest 1 View  Result Date: 10/26/2017 CLINICAL DATA:  Cough and shortness of breath EXAM: CHEST  1 VIEW COMPARISON:  10/23/2017 FINDINGS: Cardiac shadow is stable. Aortic calcifications are again seen. New bilateral pleural effusions are noted with likely underlying atelectasis. No bony abnormality is seen. IMPRESSION: New bilateral pleural effusions. Electronically Signed   By: Inez Catalina M.D.   On: 10/26/2017 11:23   US Renal  Result Date: 10/23/2017 CLINICAL DATA:  84 year old male with UTI.  Renal insufficiency. EXAM: RENAL / URINARY TRACT ULTRASOUND COMPLETE COMPARISON:  Chest CT 08/14/2017.  PET-CT 11/10/2014. FINDINGS: Right Kidney: Length: 9.7 centimeters. Increased renal echogenicity (image 5) and small simple upper pole cyst. No solid mass or hydronephrosis visualized. Left Kidney: Length: 10.6 centimeters. Left renal echogenicity similar to that on the right. Small simple  upper pole cyst (image 28). No solid mass or hydronephrosis visualized. Bladder: Decompressed by a Foley catheter, which may explain the appearance of mild bladder wall thickening (image 47). Other findings: Small volume ascites in both upper quadrants. Spleen size at the upper limits of normal to mildly enlarged with volume estimated at 413 millimeters (normal splenic volume range 83 - 412 mL). IMPRESSION: 1. No acute renal findings. Chronic medical renal disease suspected. 2. Small volume ascites, nonspecific. Borderline to mild splenomegaly. 3. Bladder decompressed by Foley catheter. Electronically Signed   By: Genevie Ann M.D.   On: 10/23/2017 18:11   Dg Chest Port 1 View  Result Date: 10/23/2017 CLINICAL DATA:  Weakness in a patient with a history of COPD and lung carcinoma. EXAM: PORTABLE CHEST 1 VIEW COMPARISON:  CT chest 08/14/2017. FINDINGS: The lungs are emphysematous. There is some volume loss in the left chest and scar in the left lung apex consistent with prior surgery for lung cancer. No consolidative process or pneumothorax is identified. Mild left basilar atelectasis is noted. Heart size is upper normal. Aortic atherosclerosis is seen. IMPRESSION: No acute disease. Emphysema. Postoperative change left chest. Atherosclerosis. Electronically Signed   By: Inge Rise M.D.   On: 10/23/2017 12:16  Subjective: He needs to feel weak.  Complains of irritation over the Foley site.  Discharge Exam: Vitals:   10/30/17 0700 10/30/17 0800  BP: (!) 156/98   Pulse: 98   Resp: 18   Temp:  97.8 F (36.6 C)  SpO2: 91%    Vitals:   10/30/17 0500 10/30/17 0600 10/30/17 0700 10/30/17 0800  BP: (!) 156/65 (!) 163/83 (!) 156/98   Pulse: (!) 103 (!) 109 98   Resp: (!) 21 (!) 27 18   Temp:    97.8 F (36.6 C)  TempSrc:    Oral  SpO2: (!) 87% 94% 91%   Weight:      Height:        General: Elderly male appears frail, not in acute distress HEENT: Pallor present, moist mucosa, supple  neck Chest: Clear bilaterally CVS: S1-S2 regular, no murmurs GI: Soft, nondistended, nontender, Foley in place, bowel sounds present Musculoskeletal: Warm, no edema CNS: Alert and oriented,    The results of significant diagnostics from this hospitalization (including imaging, microbiology, ancillary and laboratory) are listed below for reference.     Microbiology: Recent Results (from the past 240 hour(s))  Culture, blood (Routine x 2)     Status: None   Collection Time: 10/23/17 11:05 AM  Result Value Ref Range Status   Specimen Description   Final    BLOOD LEFT ANTECUBITAL Performed at [facility] [address], [place], [place] [address]    Special Requests   Final    BOTTLES DRAWN AEROBIC AND ANAEROBIC Blood Culture adequate volume Performed at [facility] [address] [place], [place] [address]    Culture   Final    NO GROWTH 6 DAYS Performed at [facility], [place] [address] [place], [place] [address]    Report Status 10/29/2017 FINAL  Final  Culture, blood (Routine x 2)     Status: None   Collection Time: 10/23/17 11:13 AM  Result Value Ref Range Status   Specimen Description   Final    BLOOD RIGHT ANTECUBITAL Performed at [facility] [address] [place], [place] [address]    Special Requests   Final    BOTTLES DRAWN AEROBIC AND ANAEROBIC Blood Culture adequate volume Performed at [facility] [address], [place], [place] [address]    Culture   Final    NO GROWTH 6 DAYS Performed at [facility], [place] [address], [place], [place] [address]    Report Status 10/29/2017 FINAL  Final  Urine culture     Status: Abnormal   Collection Time: 10/23/17 11:21 AM  Result Value Ref Range Status   Specimen Description   Final    URINE, CATHETERIZED Performed at [facility] [address], [place], [place] [address]    Special Requests   Final     NONE Performed at [facility], [place] [address], [place], [place] [address]    Culture MULTIPLE SPECIES PRESENT, SUGGEST RECOLLECTION (A)  Final   Report Status 10/25/2017 FINAL  Final  MRSA PCR Screening     Status: None   Collection Time: 10/23/17  3:59 PM  Result Value Ref Range Status   MRSA by PCR NEGATIVE NEGATIVE Final    Comment:        The GeneXpert MRSA Assay (FDA approved for NASAL specimens only), is one component of a comprehensive MRSA colonization surveillance program. It is not intended  to diagnose MRSA infection nor to guide or monitor treatment for MRSA infections. Performed at [facility], [place] [address], [place], [place] [address]      Labs: BNP (last 3 results) No results for input(s): BNP in the last 8760 hours. Basic Metabolic Panel: Recent Labs  Lab 10/24/17 0753  10/26/17 0326 10/27/17 0304 10/28/17 0640 10/29/17 0705 10/29/17 1850 10/30/17 0318  NA 140   < > 140 144 147* 150* 146* 150*  K 4.0   < > 3.7 3.9 3.7 3.7 3.6 3.5  CL 104   < > 102 106 106 111 109 111  CO2 27   < > 29 28 30 30 31 31   GLUCOSE 100*   < > 155* 146* 135* 140* 180* 136*  BUN 37*   < > 34* 32* 28* 26* 24* 25*  CREATININE 1.56*   < > 1.26* 1.12 0.94 0.95 0.78 0.94  CALCIUM 8.6*   < > 9.2 9.3 9.4 9.6 9.2 9.4  MG 1.6*  --  1.8  --   --   --   --   --   PHOS 3.4  --   --   --   --   --   --   --    < > = values in this interval not displayed.   Liver Function Tests: No results for input(s): AST, ALT, ALKPHOS, BILITOT, PROT, ALBUMIN in the last 168 hours. No results for input(s): LIPASE, AMYLASE in the last 168 hours. No results for input(s): AMMONIA in the last 168 hours. CBC: Recent Labs  Lab 10/26/17 0326 10/27/17 0304 10/28/17 0640 10/29/17 0705 10/30/17 0318  WBC 8.8 8.4 7.0 9.3 10.0  HGB 9.9* 10.0* 9.9* 10.6* 10.2*  HCT 32.6* 33.7* 32.8* 35.9* 35.0*  MCV 92.4 94.9 93.2 95.2 95.1  PLT 203 243 224 244 244   Cardiac  Enzymes: No results for input(s): CKTOTAL, CKMB, CKMBINDEX, TROPONINI in the last 168 hours. BNP: Invalid input(s): POCBNP CBG: Recent Labs  Lab 10/27/17 2013 10/27/17 2331 10/28/17 0331  GLUCAP 128* 136* 145*   D-Dimer No results for input(s): DDIMER in the last 72 hours. Hgb A1c No results for input(s): HGBA1C in the last 72 hours. Lipid Profile No results for input(s): CHOL, HDL, LDLCALC, TRIG, CHOLHDL, LDLDIRECT in the last 72 hours. Thyroid function studies No results for input(s): TSH, T4TOTAL, T3FREE, THYROIDAB in the last 72 hours.  Invalid input(s): FREET3 Anemia work up No results for input(s): VITAMINB12, FOLATE, FERRITIN, TIBC, IRON, RETICCTPCT in the last 72 hours. Urinalysis    Component Value Date/Time   COLORURINE YELLOW 10/23/2017 1121   APPEARANCEUR TURBID (A) 10/23/2017 1121   LABSPEC 1.013 10/23/2017 1121   PHURINE 7.0 10/23/2017 1121   GLUCOSEU NEGATIVE 10/23/2017 1121   HGBUR MODERATE (A) 10/23/2017 1121   BILIRUBINUR NEGATIVE 10/23/2017 1121   BILIRUBINUR negative 04/21/2015 1049   BILIRUBINUR neg 10/10/2013 1033   KETONESUR NEGATIVE 10/23/2017 1121   PROTEINUR >=300 (A) 10/23/2017 1121   UROBILINOGEN 0.2 04/21/2015 1049   UROBILINOGEN 0.2 11/16/2014 2120   NITRITE NEGATIVE 10/23/2017 1121   LEUKOCYTESUR MODERATE (A) 10/23/2017 1121   Sepsis Labs Invalid input(s): PROCALCITONIN,  WBC,  LACTICIDVEN Microbiology Recent Results (from the past 240 hour(s))  Culture, blood (Routine x 2)     Status: None   Collection Time: 10/23/17 11:05 AM  Result Value Ref Range Status   Specimen Description   Final    BLOOD LEFT ANTECUBITAL Performed at [facility]  [facility], [place] [address] [place], [place] [address]    Special Requests   Final    BOTTLES DRAWN AEROBIC AND ANAEROBIC Blood Culture adequate volume Performed at [facility] [address], [place], [place] [address]    Culture   Final    NO GROWTH 6  DAYS Performed at [facility], [place] [address] [place], [place] [address]    Report Status 10/29/2017 FINAL  Final  Culture, blood (Routine x 2)     Status: None   Collection Time: 10/23/17 11:13 AM  Result Value Ref Range Status   Specimen Description   Final    BLOOD RIGHT ANTECUBITAL Performed at [facility] [address] [place], [place] [address]    Special Requests   Final    BOTTLES DRAWN AEROBIC AND ANAEROBIC Blood Culture adequate volume Performed at [facility] [address] [place], [place] [address]    Culture   Final    NO GROWTH 6 DAYS Performed at [facility], [place] [address], [place], [place] [address]    Report Status 10/29/2017 FINAL  Final  Urine culture     Status: Abnormal   Collection Time: 10/23/17 11:21 AM  Result Value Ref Range Status   Specimen Description   Final    URINE, CATHETERIZED Performed at [facility] [address] [place], [place] [address]    Special Requests   Final    NONE Performed at [facility], [place] [address], [place], [place] [address]    Culture MULTIPLE SPECIES PRESENT, SUGGEST RECOLLECTION (A)  Final   Report Status 10/25/2017 FINAL  Final  MRSA PCR Screening     Status: None   Collection Time: 10/23/17  3:59 PM  Result Value Ref Range Status   MRSA by PCR NEGATIVE NEGATIVE Final    Comment:        The GeneXpert MRSA Assay (FDA approved for NASAL specimens only), is one component of a comprehensive MRSA colonization surveillance program. It is not intended to diagnose MRSA infection nor to guide or monitor treatment for MRSA infections. Performed at [facility], [place] [address], [place], [place] [address]      Time coordinating discharge: 35 minutes  SIGNED:   Louellen Molder, MD  [facility] 10/30/2017, 2:09 PM Pager   If 7PM-7AM, please contact  night-coverage [email or web address] Password [email or web address]

## 2017-10-30 NOTE — Progress Notes (Signed)
RN contacted RN at [facility], report given and all questions answered at this time.  Pt. Appears to be comfortable and ready for discharge.  Family at bedside.

## 2017-10-30 NOTE — Progress Notes (Signed)
CSW spoke with patient, spouse, and son, via bedside and confirmed plans to dc to [facility]. [facility] called for transport.   Kingsley Spittle, [place]  [phone]

## 2017-10-30 NOTE — Progress Notes (Signed)
[facility] room available for Benjamin Patel today. Met with patient earlier this morning. At that time he was aware the plan had changed and he would not be going home based on medical recommendations. Over the course of the day, met with two sons, daughter and son-in-law to discuss options. After eligibility confirmed, met with spouse to complete paper work for transfer to [facility] today. Updated RNCM Velva Harman and [place]. Discharge summary has been routed and bedside RN has been updated.   RN please call report to [phone].  Thank you,  Benjamin Conte, LCSW [phone]

## 2017-10-30 NOTE — Consult Note (Signed)
Consultation Note Date: 10/30/2017   Patient Name: Benjamin Patel  DOB: 1927-12-14  MRN: [id number]  Age / Sex: 84 y.o., male  PCP: Deland Pretty, MD Referring Physician: Louellen Molder, MD  Reason for Consultation: Establishing goals of care  HPI/Patient Profile: 84 y.o. male  admitted on 10/23/2017   Clinical Assessment and Goals of Care:  84 year old gentleman who lives at home with his wife, admitted with septic shock, was found to have sepsis secondary to urinary tract infection. Initially, required antibiotics as well as pressors. Patient was seen by urology as well as surgery. Patient was also seen by cardiology. It was noted on imaging that the patient possibly has a colovesical fistula. As per general surgery, a fecal dye version surgery with possible need for colostomy was recommended.  On multiple occasions, the patient was noted to have been adamant about not undergoing surgery. He simply wanted to go home.  Patient was seen by hospice services. Patient was to have medical equipment delivered and was to go home with hospice services.  I met with the patient and his wife Ms. [name] was also at the bedside. I introduce myself and palliative care as follows: Palliative medicine is specialized medical care for people living with serious illness. It focuses on providing relief from the symptoms and stress of a serious illness. The goal is to improve quality of life for both the patient and the family.  Patient is awake alert and oriented. He is able to provide all aspects of history taking, he is able to answer all questions appropriately. He again states that he does not want any surgery. He states that he wants to go home. He states that arrangements have been made at a home her room has been cleared out and medical equipment has been delivered.  Patient's wife is present at the bedside. She is a  frail elderly woman wearing a brace on one of her hands. Patient's wife states that there is no 24 7 care. She is unsure of how she can take care of him at home even with hospice assistance. Currently, patient is 2 person assist.  We discussed again about hospice philosophy of care. Additionally, the types of care that can be provided in a residential hospice setting versus home with hospice was discussed in detail. Patient's wife states that she has already been looking at hospice facilities both here in [place] locally as well as in [place], [place].  The patient is able to understand the different options for his discharge namely residential hospice versus home with hospice. He states he does not want to overburden his wife. He states that at present they don't have any private duty sitters hired. He is agreeable to considering residential hospice at discharge. He has increasing pain and non-pain symptom management needs. He is oral intake continues to decline. He continues to have functional decline. It is possible that the patient has few weeks life expectancy. Will need symptom management and may have high risk of developing sepsis from  this fistula.  HCPOA  wife Has kids  SUMMARY OF RECOMMENDATIONS    DNR/DNI Focus more on comfort measures Social work consult for possible residential hospice placement. See above. Continue current pain and non-pain symptom management medications. Thank you for the consult.  Code Status/Advance Care Planning:  DNR    Symptom Management:    see above   Palliative Prophylaxis:   Delirium Protocol  Additional Recommendations (Limitations, Scope, Preferences):  Full Comfort Care  Psycho-social/Spiritual:   Desire for further Chaplaincy support:yes  Additional Recommendations: Education on Hospice  Prognosis:   < 2 weeks  Discharge Planning: Hospice facility      Primary Diagnoses: Present on Admission: . Septic shock  ([place])   I have reviewed the medical record, interviewed the patient and family, and examined the patient. The following aspects are pertinent.  Past Medical History:  Diagnosis Date  . Abnormal prostate exam   . Arthritis    Gout   . Asthma    as a child  . Carotid stenosis   . Carotid stenosis right side   Dr.Early does carotid ultrasounds yrly-last one Mar 14-report in epic  . Cholelithiasis   . COPD (chronic obstructive pulmonary disease) (HCC)    slight   . Enlarged prostate    slightly   . Essential hypertension, benign    takes Clonodine,Losartan,and Metoprolol and HCTZ  . Gout    no meds required  . History of bronchitis   . History of colon polyps   . History of shingles   . Lung cancer ([place]) dx'd 2014   LLL resection; xrt  . Mixed hyperlipidemia    takes Pravastatin daily  . PAC (premature atrial contraction)   . Pneumonia 02/08/2012  . PVCs (premature ventricular contractions)   . Radiation 12/13/14-12/24/14   left upper lobe nodule 50 gray  . Renal artery stenosis (HCC)    right  . Renal insufficiency   . Skin cancer    Lung Ca- Left Lung  . Type II or unspecified type diabetes mellitus without mention of complication, not stated as uncontrolled    02/14/2016 diet controlled   Social History   Socioeconomic History  . Marital status: Married    Spouse name: [name]  . Number of children: 3  . Years of education: 69  . Highest education level: Not on file  Occupational History  . Occupation: retired    Comment: Herbalist  Social Needs  . Financial resource strain: Not on file  . Food insecurity:    Worry: Not on file    Inability: Not on file  . Transportation needs:    Medical: Not on file    Non-medical: Not on file  Tobacco Use  . Smoking status: Former Smoker    Packs/day: 1.50    Years: 30.00    Pack years: 45.00    Types: Cigarettes    Last attempt to quit: 01/16/1976    Years since quitting: 41.8  . Smokeless tobacco: Never  Used  Substance and Sexual Activity  . Alcohol use: No    Alcohol/week: 0.0 standard drinks  . Drug use: No  . Sexual activity: Not on file  Lifestyle  . Physical activity:    Days per week: Not on file    Minutes per session: Not on file  . Stress: Not on file  Relationships  . Social connections:    Talks on phone: Not on file    Gets together: Not on file  Attends religious service: Not on file    Active member of club or organization: Not on file    Attends meetings of clubs or organizations: Not on file    Relationship status: Not on file  Other Topics Concern  . Not on file  Social History Narrative   Lives at home with wife   Caffeine- coffee, 1 cup daily   Family History  Problem Relation Age of Onset  . Heart disease Mother   . Diabetes Mother        AKA  Right   . Hypertension Mother   . Varicose Veins Mother   . Kidney disease Mother        One Kidney  . Cancer Father        Lung   Scheduled Meds: . apixaban  5 mg Oral BID  . chlorhexidine  15 mL Mouth Rinse BID  . diltiazem  180 mg Oral Daily  . mouth rinse  15 mL Mouth Rinse q12n4p  . metoprolol tartrate  75 mg Oral BID  . polycarbophil  625 mg Oral Daily   Continuous Infusions: . sodium chloride Stopped (10/27/17 2238)  . cefTRIAXone (ROCEPHIN)  IV Stopped (10/29/17 2146)   PRN Meds:.sodium chloride, acetaminophen, iopamidol, morphine injection, traZODone Medications Prior to Admission:  Prior to Admission medications   Medication Sig Start Date End Date Taking? Authorizing Provider  allopurinol (ZYLOPRIM) 100 MG tablet Take 100 mg by mouth 2 (two) times daily.  05/16/16  Yes [provider]  apixaban (ELIQUIS) 5 MG TABS tablet Take 1 tablet (5 mg total) by mouth 2 (two) times daily. 06/18/17  Yes Jerline Pain, MD  cholecalciferol (VITAMIN D) 1000 units tablet Take 1,000 Units by mouth 2 (two) times daily.    Yes [provider]  diltiazem (CARDIZEM CD) 180 MG 24 hr capsule Take  1 capsule (180 mg total) by mouth daily. 09/09/17 12/08/17 Yes Jerline Pain, MD  doxycycline (VIBRA-TABS) 100 MG tablet Take 100 mg by mouth every 12 (twelve) hours. 10/17/17  Yes [provider]  gabapentin (NEURONTIN) 400 MG capsule Take 400 mg by mouth daily.   Yes [provider]  metoprolol succinate (TOPROL-XL) 100 MG 24 hr tablet Take 1 tablet (100 mg total) by mouth daily. Take with or immediately following a meal. 08/17/14  Yes Daub, Loura Back, MD  mirabegron ER (MYRBETRIQ) 25 MG TB24 tablet Take 25 mg by mouth daily.   Yes [provider]  Probiotic Product (PROBIOTIC PO) Take 1 tablet by mouth daily.   Yes [provider]  tamsulosin (FLOMAX) 0.4 MG CAPS capsule Take 0.4 mg by mouth daily. 08/10/17  Yes [provider]   Allergies  Allergen Reactions  . Ace Inhibitors Cough  . Codeine Other (See Comments)    GI Upset  . Indomethacin Other (See Comments)    Elevated BP  . Losartan Swelling    Angioedema   Review of Systems +generalized pain +weakness  Physical Exam Frail, weak elderly gentleman resting in bed Diminished breath sounds S1-S2 Abdomen is soft and non distended Patient is awake alert oriented, answers all questions asked appropriately, is a little hard of hearing No edema  Vital Signs: BP (!) 156/98   Pulse 98   Temp 97.8 F (36.6 C) (Oral)   Resp 18   Ht '5\' 8"'  (1.727 m)   Wt 73.5 kg   SpO2 91%   BMI 24.64 kg/m  Pain Scale: 0-10 POSS *See Group Information*: 1-Acceptable,Awake  and alert Pain Score: 3    SpO2: SpO2: 91 % O2 Device:SpO2: 91 % O2 Flow Rate: .O2 Flow Rate (L/min): 3 L/min  IO: Intake/output summary:   Intake/Output Summary (Last 24 hours) at 10/30/2017 1219 Last data filed at 10/30/2017 0737 Gross per 24 hour  Intake 1030.42 ml  Output 500 ml  Net 530.42 ml    LBM: Last BM Date: 10/30/17 Baseline Weight: Weight: 67.1 kg Most recent weight: Weight: 73.5 kg     Palliative  Assessment/Data:    PPS 30% Time In:  11 Time Out:  12.10 Time Total:  70 min  Greater than 50%  of this time was spent counseling and coordinating care related to the above assessment and plan.  Signed by: Loistine Chance, MD  [phone]  Please contact Palliative Medicine Team phone at [phone] for questions and concerns.  For individual provider: See Shea Evans

## 2017-10-30 NOTE — Progress Notes (Signed)
Physical Therapy Treatment Patient Details Name: Benjamin Patel MRN: [id number] DOB: 05-Sep-1927 Today's Date: 10/30/2017    History of Present Illness 84 YO male admitted 10/24/14 with urosepsis, colorvesicle fistula, for planned colostomy, aND left ureteral  CALCULI, STABLE. H/O PVCs,, DM, COPD, Lung cancer.    PT Comments    Pt motivated to get OOB and stand/reposition.  Pt required increased time and + 2 assist for safety limited by weakness.  Assisted OOB to take a few steps to recliner with upright stance time approx 1 min.  Positioned in recliner with multiple pillows.  Spouse in and out.    Follow Up Recommendations  SNF(per chart review plans are for inpt Hospice)     Equipment Recommendations  None recommended by PT    Recommendations for Other Services       Precautions / Restrictions Precautions Precautions: Fall Precaution Comments: monitor sats Restrictions Weight Bearing Restrictions: No    Mobility  Bed Mobility Overal bed mobility: Needs Assistance Bed Mobility: Supine to Sit     Supine to sit: Min assist;Mod assist     General bed mobility comments: increased time and assist with multiple lines  Transfers Overall transfer level: Needs assistance Equipment used: Rolling walker (2 wheeled) Transfers: Sit to/from Stand Sit to Stand: Mod assist;+2 safety/equipment;Min assist         General transfer comment: assist for safety and multiple lines with VC's on proper hand placement and trun completion  Ambulation/Gait Ambulation/Gait assistance: Mod assist;+2 safety/equipment Gait Distance (Feet): 2 Feet Assistive device: Rolling walker (2 wheeled) Gait Pattern/deviations: Step-to pattern;Step-through pattern Gait velocity: decreased    General Gait Details: limited tolerance due to max c/o weakness.  Remained in room.     Stairs             Wheelchair Mobility    Modified Rankin (Stroke Patients Only)       Balance                                             Cognition Arousal/Alertness: Awake/alert Behavior During Therapy: WFL for tasks assessed/performed Overall Cognitive Status: Within Functional Limits for tasks assessed                                 General Comments: motivated to get OOB and stand      Exercises      General Comments        Pertinent Vitals/Pain Pain Assessment: No/denies pain    Home Living                      Prior Function            PT Goals (current goals can now be found in the care plan section) Progress towards PT goals: Progressing toward goals    Frequency    Min 2X/week      PT Plan Current plan remains appropriate;Frequency needs to be updated    Co-evaluation              AM-PAC PT "6 Clicks" Daily Activity  Outcome Measure  Difficulty turning over in bed (including adjusting bedclothes, sheets and blankets)?: Unable Difficulty moving from lying on back to sitting on the side of the bed? : Unable Difficulty sitting down on and  standing up from a chair with arms (e.g., wheelchair, bedside commode, etc,.)?: Unable Help needed moving to and from a bed to chair (including a wheelchair)?: Total Help needed walking in hospital room?: Total Help needed climbing 3-5 steps with a railing? : Total 6 Click Score: 6    End of Session Equipment Utilized During Treatment: Oxygen Activity Tolerance: Patient limited by fatigue Patient left: in chair;with call bell/phone within reach;with chair alarm set;with family/visitor present Nurse Communication: Mobility status PT Visit Diagnosis: Unsteadiness on feet (R26.81)     Time: 1345-1410 PT Time Calculation (min) (ACUTE ONLY): 25 min  Charges:  $Gait Training: 8-22 mins $Therapeutic Activity: 8-22 mins                     Rica Koyanagi  PTA Acute  Rehabilitation Services Pager      [phone] Office      [phone]

## 2017-10-30 NOTE — Progress Notes (Signed)
   10/30/17 1646  Clinical Encounter Type  Visited With Patient  Visit Type Initial  Referral From Nurse  Consult/Referral To Chaplain  Chaplain responded to RN request for prayer shawl for Pt. transitioning to [facility].

## 2017-10-30 NOTE — Progress Notes (Signed)
Patient discharged to [facility], belongings given to pt. Wife and son.  Pt. Left via transport.

## 2017-11-15 DEATH — deceased

## 2018-01-09 ENCOUNTER — Telehealth: Payer: Self-pay | Admitting: Podiatry

## 2018-01-09 NOTE — Telephone Encounter (Signed)
Pt called to advise [place] to cancel appt pt has deceased on 11/26/2017

## 2018-01-13 ENCOUNTER — Ambulatory Visit: Payer: Medicare Other | Admitting: Podiatry
